# Patient Record
Sex: Female | Born: 1985 | Race: Black or African American | Hispanic: No | Marital: Single | Smoking: Never smoker
Health system: Southern US, Community
[De-identification: ages and names within clinical notes are randomized; demographics above are authoritative.]

## PROBLEM LIST (undated history)

## (undated) ENCOUNTER — Inpatient Hospital Stay (HOSPITAL_COMMUNITY): Payer: Self-pay

## (undated) DIAGNOSIS — A749 Chlamydial infection, unspecified: Secondary | ICD-10-CM

## (undated) DIAGNOSIS — O009 Unspecified ectopic pregnancy without intrauterine pregnancy: Secondary | ICD-10-CM

## (undated) DIAGNOSIS — R011 Cardiac murmur, unspecified: Secondary | ICD-10-CM

## (undated) DIAGNOSIS — N730 Acute parametritis and pelvic cellulitis: Secondary | ICD-10-CM

## (undated) DIAGNOSIS — R87629 Unspecified abnormal cytological findings in specimens from vagina: Secondary | ICD-10-CM

## (undated) DIAGNOSIS — D649 Anemia, unspecified: Secondary | ICD-10-CM

## (undated) DIAGNOSIS — O139 Gestational [pregnancy-induced] hypertension without significant proteinuria, unspecified trimester: Secondary | ICD-10-CM

## (undated) DIAGNOSIS — R87619 Unspecified abnormal cytological findings in specimens from cervix uteri: Secondary | ICD-10-CM

## (undated) DIAGNOSIS — IMO0002 Reserved for concepts with insufficient information to code with codable children: Secondary | ICD-10-CM

## (undated) HISTORY — PX: TOOTH EXTRACTION: SUR596

## (undated) HISTORY — PX: NO PAST SURGERIES: SHX2092

---
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
## 2005-01-04 ENCOUNTER — Emergency Department: Payer: No Typology Code available for payment source | Admitting: Emergency Medicine

## 2010-04-01 ENCOUNTER — Inpatient Hospital Stay (HOSPITAL_COMMUNITY)
Admission: AD | Admit: 2010-04-01 | Discharge: 2010-04-02 | Payer: Self-pay | Source: Home / Self Care | Admitting: Obstetrics & Gynecology

## 2010-07-08 LAB — WET PREP, GENITAL

## 2010-07-08 LAB — GC/CHLAMYDIA PROBE AMP, GENITAL: Chlamydia, DNA Probe: NEGATIVE

## 2010-07-08 LAB — URINALYSIS, ROUTINE W REFLEX MICROSCOPIC
Glucose, UA: NEGATIVE mg/dL
Hgb urine dipstick: NEGATIVE
Ketones, ur: NEGATIVE mg/dL
Protein, ur: NEGATIVE mg/dL
Urobilinogen, UA: 0.2 mg/dL (ref 0.0–1.0)

## 2010-07-29 ENCOUNTER — Emergency Department (HOSPITAL_COMMUNITY)
Admission: EM | Admit: 2010-07-29 | Discharge: 2010-07-30 | Disposition: A | Discharge status: Home / Self Care | Payer: Self-pay | Attending: Emergency Medicine | Admitting: Emergency Medicine

## 2010-07-29 DIAGNOSIS — L0231 Cutaneous abscess of buttock: Secondary | ICD-10-CM | POA: Insufficient documentation

## 2011-04-06 ENCOUNTER — Inpatient Hospital Stay (HOSPITAL_COMMUNITY)
Admission: AD | Admit: 2011-04-06 | Discharge: 2011-04-06 | Disposition: A | Discharge status: Home / Self Care | Payer: Medicaid Other | Source: Ambulatory Visit | Attending: Obstetrics & Gynecology | Admitting: Obstetrics & Gynecology

## 2011-04-06 ENCOUNTER — Encounter (HOSPITAL_COMMUNITY): Payer: Self-pay | Admitting: *Deleted

## 2011-04-06 DIAGNOSIS — R05 Cough: Secondary | ICD-10-CM | POA: Insufficient documentation

## 2011-04-06 DIAGNOSIS — R509 Fever, unspecified: Secondary | ICD-10-CM | POA: Insufficient documentation

## 2011-04-06 DIAGNOSIS — J111 Influenza due to unidentified influenza virus with other respiratory manifestations: Secondary | ICD-10-CM

## 2011-04-06 DIAGNOSIS — R059 Cough, unspecified: Secondary | ICD-10-CM | POA: Insufficient documentation

## 2011-04-06 DIAGNOSIS — R229 Localized swelling, mass and lump, unspecified: Secondary | ICD-10-CM | POA: Insufficient documentation

## 2011-04-06 DIAGNOSIS — J029 Acute pharyngitis, unspecified: Secondary | ICD-10-CM | POA: Insufficient documentation

## 2011-04-06 HISTORY — DX: Reserved for concepts with insufficient information to code with codable children: IMO0002

## 2011-04-06 HISTORY — DX: Cardiac murmur, unspecified: R01.1

## 2011-04-06 HISTORY — DX: Unspecified abnormal cytological findings in specimens from cervix uteri: R87.619

## 2011-04-06 HISTORY — DX: Chlamydial infection, unspecified: A74.9

## 2011-04-06 LAB — URINALYSIS, ROUTINE W REFLEX MICROSCOPIC
Ketones, ur: NEGATIVE mg/dL
Leukocytes, UA: NEGATIVE
Nitrite: NEGATIVE
Protein, ur: NEGATIVE mg/dL
Urobilinogen, UA: 0.2 mg/dL (ref 0.0–1.0)
pH: 7 (ref 5.0–8.0)

## 2011-04-06 MED ORDER — OSELTAMIVIR PHOSPHATE 75 MG PO CAPS: 75.0000 mg | ORAL_CAPSULE | Freq: Two times a day (BID) | ORAL | Status: AC

## 2011-04-06 MED ORDER — IBUPROFEN 800 MG PO TABS
800.0000 mg | ORAL_TABLET | Freq: Once | ORAL | Status: AC
  Administered 2011-04-06: 800 mg via ORAL
  Filled 2011-04-06: qty 1

## 2011-04-06 NOTE — Progress Notes (Signed)
Patient states she has had a low back pain for about 2 weeks. Had a knot on the lower spine that she states has been there for about one week and is getting bigger and is painful. Started having headache, chills and fever 101.9, cough and sore throat started yesterday. General aching.

## 2011-04-06 NOTE — ED Provider Notes (Signed)
History     Chief Complaint  Patient presents with  . Influenza  . Back Pain   HPI 25 y.o. female with back pain x 1 week, fever, chills, cough, sore throat since last night. No nausea, vomiting, dysuria. Lump on back, low mid back, x 1-2 weeks, started small, getting bigger, painful.    Past Medical History  Diagnosis Date  . Heart murmur     childhood  . Abnormal Pap smear   . Chlamydia     Past Surgical History  Procedure Date  . Tooth extraction     Family History  Problem Relation Age of Onset  . Anesthesia problems Neg Hx     History  Substance Use Topics  . Smoking status: Never Smoker   . Smokeless tobacco: Never Used  . Alcohol Use: Yes     Special occas.    Allergies: No Known Allergies  No prescriptions prior to admission    Review of Systems  Constitutional: Positive for fever, chills and malaise/fatigue.  HENT: Positive for sore throat.   Respiratory: Positive for cough. Negative for shortness of breath.   Cardiovascular: Negative.   Gastrointestinal: Negative.   Genitourinary: Negative.   Musculoskeletal: Positive for myalgias and back pain.  Neurological: Positive for headaches.  Psychiatric/Behavioral: Negative.    Physical Exam   Blood pressure 103/58, pulse 94, temperature 100.8 F (38.2 C), temperature source Oral, resp. rate 20, height 5\' 8"  (1.727 m), weight 168 lb 6.4 oz (76.386 kg), last menstrual period 03/27/2011, SpO2 99.00%.  Physical Exam  Nursing note and vitals reviewed. Constitutional: She is oriented to person, place, and time. She appears well-developed and well-nourished. No distress.  Cardiovascular: Normal rate, regular rhythm and normal heart sounds.   Respiratory: Effort normal and breath sounds normal. No stridor. No respiratory distress. She has no wheezes. She has no rales.  GI: Soft. There is no tenderness.  Musculoskeletal: Normal range of motion.  Neurological: She is alert and oriented to person, place, and  time.  Skin: Skin is warm and dry.     Psychiatric: She has a normal mood and affect.    MAU Course  Procedures  Results for orders placed during the hospital encounter of 04/06/11 (from the past 24 hour(s))  URINALYSIS, ROUTINE W REFLEX MICROSCOPIC     Status: Normal   Collection Time   04/06/11  9:45 AM      Component Value Range   Color, Urine YELLOW  YELLOW    APPearance CLEAR  CLEAR    Specific Gravity, Urine 1.025  1.005 - 1.030    pH 7.0  5.0 - 8.0    Glucose, UA NEGATIVE  NEGATIVE (mg/dL)   Hgb urine dipstick NEGATIVE  NEGATIVE    Bilirubin Urine NEGATIVE  NEGATIVE    Ketones, ur NEGATIVE  NEGATIVE (mg/dL)   Protein, ur NEGATIVE  NEGATIVE (mg/dL)   Urobilinogen, UA 0.2  0.0 - 1.0 (mg/dL)   Nitrite NEGATIVE  NEGATIVE    Leukocytes, UA NEGATIVE  NEGATIVE    Flu test pending  Assessment and Plan  25 y.o. female with flu like symptoms - treat presumptively with Tamiflu, comfort measures rev'd, f/u at [facility] if symptoms worsen Mass in lumbar area - f/u at urgent care for eval   Barron Vanloan 04/06/2011, 10:14 AM

## 2011-06-19 ENCOUNTER — Encounter (HOSPITAL_COMMUNITY): Payer: Self-pay | Admitting: *Deleted

## 2011-06-19 ENCOUNTER — Emergency Department (HOSPITAL_COMMUNITY): Payer: Medicaid Other

## 2011-06-19 ENCOUNTER — Emergency Department (HOSPITAL_COMMUNITY)
Admission: EM | Admit: 2011-06-19 | Discharge: 2011-06-20 | Disposition: A | Discharge status: Home / Self Care | Payer: Medicaid Other | Attending: Emergency Medicine | Admitting: Emergency Medicine

## 2011-06-19 DIAGNOSIS — R509 Fever, unspecified: Secondary | ICD-10-CM | POA: Insufficient documentation

## 2011-06-19 DIAGNOSIS — N898 Other specified noninflammatory disorders of vagina: Secondary | ICD-10-CM | POA: Insufficient documentation

## 2011-06-19 DIAGNOSIS — N949 Unspecified condition associated with female genital organs and menstrual cycle: Secondary | ICD-10-CM | POA: Insufficient documentation

## 2011-06-19 DIAGNOSIS — B349 Viral infection, unspecified: Secondary | ICD-10-CM

## 2011-06-19 DIAGNOSIS — R062 Wheezing: Secondary | ICD-10-CM | POA: Insufficient documentation

## 2011-06-19 DIAGNOSIS — B9789 Other viral agents as the cause of diseases classified elsewhere: Secondary | ICD-10-CM | POA: Insufficient documentation

## 2011-06-19 DIAGNOSIS — R197 Diarrhea, unspecified: Secondary | ICD-10-CM | POA: Insufficient documentation

## 2011-06-19 DIAGNOSIS — R05 Cough: Secondary | ICD-10-CM | POA: Insufficient documentation

## 2011-06-19 DIAGNOSIS — R112 Nausea with vomiting, unspecified: Secondary | ICD-10-CM | POA: Insufficient documentation

## 2011-06-19 DIAGNOSIS — R059 Cough, unspecified: Secondary | ICD-10-CM | POA: Insufficient documentation

## 2011-06-19 LAB — WET PREP, GENITAL

## 2011-06-19 LAB — DIFFERENTIAL
Basophils Absolute: 0 10*3/uL (ref 0.0–0.1)
Basophils Relative: 0 % (ref 0–1)
Monocytes Absolute: 0.3 10*3/uL (ref 0.1–1.0)
Neutro Abs: 1.5 10*3/uL — ABNORMAL LOW (ref 1.7–7.7)
Neutrophils Relative %: 66 % (ref 43–77)

## 2011-06-19 LAB — POCT PREGNANCY, URINE: Preg Test, Ur: NEGATIVE

## 2011-06-19 LAB — CBC
MCHC: 33.5 g/dL (ref 30.0–36.0)
Platelets: 136 10*3/uL — ABNORMAL LOW (ref 150–400)
RDW: 13.9 % (ref 11.5–15.5)

## 2011-06-19 LAB — BASIC METABOLIC PANEL
Chloride: 97 mEq/L (ref 96–112)
Creatinine, Ser: 0.79 mg/dL (ref 0.50–1.10)
GFR calc Af Amer: >90 mL/min (ref >90)
Potassium: 3.6 mEq/L (ref 3.5–5.1)

## 2011-06-19 LAB — URINALYSIS, ROUTINE W REFLEX MICROSCOPIC
Ketones, ur: NEGATIVE mg/dL
Leukocytes, UA: NEGATIVE
Nitrite: NEGATIVE
pH: 7.5 (ref 5.0–8.0)

## 2011-06-19 MED ORDER — ONDANSETRON HCL 4 MG/2ML IJ SOLN
4.0000 mg | Freq: Once | INTRAMUSCULAR | Status: AC
  Administered 2011-06-19: 4 mg via INTRAVENOUS
  Filled 2011-06-19: qty 2

## 2011-06-19 MED ORDER — SODIUM CHLORIDE 0.9 % IV BOLUS (SEPSIS)
1000.0000 mL | Freq: Once | INTRAVENOUS | Status: AC
  Administered 2011-06-19: 1000 mL via INTRAVENOUS

## 2011-06-19 NOTE — ED Notes (Signed)
MD at bedside. Dammen, PA in for pelvic exam

## 2011-06-19 NOTE — ED Notes (Signed)
Pt states that she has chills, N/V, and a headache x 3 days.  Pt states that "she cant feel normal" unless she takes motrin.  Pt states that she is vomiting since yesterday every time she eats.  Pt states that she is having "a lot" of vaginal discharge that is described as clear, not malodorous, but profuse.  Pt states that she feels as if her vagina is "swollen but I don't know".

## 2011-06-19 NOTE — ED Notes (Signed)
Patient transported to Ultrasound 

## 2011-06-19 NOTE — ED Provider Notes (Signed)
History     CSN: [id number]  Arrival date & time 06/19/11  1941   First MD Initiated Contact with Patient 06/19/11 2135      Chief Complaint  Patient presents with  . URI  . Vaginal Discharge     HPI  History provided by the patient. Patient is a 26 year old G4P3 female with past history of Chlamydia infection who presents with complaints of URI symptoms as well as complaints of clear vaginal discharge for the past 4 days. URI symptoms are described as nasal congestion, rhinorrhea, productive cough, generalized fatigue and myalgias. Symptoms are improved with ibuprofen. She has been taking this every 4-6 hours regularly. Vaginal discharge described as copious amounts of clear odorless fluid. She denies any vaginal bleeding, rash or pain. She denies any dysuria, hematuria, urinary frequency. Patient also reports associated subjective fever, chills, onset of nausea vomiting diarrhea for the past 2 days. She denies any other aggravating or alleviating factors. She denies any recent travel or known sick contacts.   Past Medical History  Diagnosis Date  . Heart murmur     childhood  . Abnormal Pap smear   . Chlamydia     Past Surgical History  Procedure Date  . Tooth extraction     Family History  Problem Relation Age of Onset  . Anesthesia problems Neg Hx     History  Substance Use Topics  . Smoking status: Never Smoker   . Smokeless tobacco: Never Used  . Alcohol Use: Yes     Special occas.    OB History    Grav Para Term Preterm Abortions TAB SAB Ect Mult Living   5 4 1 3 1  0 1 0 0 3      Review of Systems  Constitutional: Positive for fever, chills and appetite change.  HENT: Positive for congestion and rhinorrhea.   Respiratory: Positive for cough.   Gastrointestinal: Positive for nausea, vomiting and diarrhea.  Genitourinary: Positive for vaginal discharge. Negative for dysuria, frequency, hematuria, flank pain, vaginal bleeding, vaginal pain and pelvic pain.   All other systems reviewed and are negative.    Allergies  Review of patient's allergies indicates no known allergies.  Home Medications   Current Outpatient Rx  Name Route Sig Dispense Refill  . IBUPROFEN 200 MG PO TABS Oral Take 400-600 mg by mouth every 6 (six) hours as needed. Pain      BP 112/76  Pulse 68  Temp(Src) 99.3 F (37.4 C) (Oral)  SpO2 100%  LMP 05/29/2011  Physical Exam  Nursing note and vitals reviewed. Constitutional: She is oriented to person, place, and time. She appears well-developed and well-nourished. No distress.  HENT:  Head: Normocephalic and atraumatic.  Neck: Normal range of motion. Neck supple.       No meningeal signs  Cardiovascular: Normal rate and regular rhythm.   No murmur heard. Pulmonary/Chest: Effort normal. No respiratory distress. She has wheezes. She has rales.  Abdominal: Soft. She exhibits no distension. There is no tenderness. There is no rebound and no guarding.  Genitourinary: Cervix exhibits discharge and friability. Right adnexum displays tenderness. Right adnexum displays no mass. Left adnexum displays tenderness. Left adnexum displays no mass.       Chaperone was present  Neurological: She is alert and oriented to person, place, and time.  Skin: Skin is warm and dry. No rash noted.  Psychiatric: She has a normal mood and affect. Her behavior is normal.    ED Course  Procedures  Results for orders placed during the hospital encounter of 06/19/11  CBC      Component Value Range   WBC 2.3 (*) 4.0 - 10.5 (K/uL)   RBC 3.99  3.87 - 5.11 (MIL/uL)   Hemoglobin 11.2 (*) 12.0 - 15.0 (g/dL)   HCT 84.6 (*) 96.2 - 46.0 (%)   MCV 83.7  78.0 - 100.0 (fL)   MCH 28.1  26.0 - 34.0 (pg)   MCHC 33.5  30.0 - 36.0 (g/dL)   RDW 95.2  84.1 - 32.4 (%)   Platelets 136 (*) 150 - 400 (K/uL)  DIFFERENTIAL      Component Value Range   Neutrophils Relative 66  43 - 77 (%)   Neutro Abs 1.5 (*) 1.7 - 7.7 (K/uL)   Lymphocytes Relative 22   12 - 46 (%)   Lymphs Abs 0.5 (*) 0.7 - 4.0 (K/uL)   Monocytes Relative 11  3 - 12 (%)   Monocytes Absolute 0.3  0.1 - 1.0 (K/uL)   Eosinophils Relative 0  0 - 5 (%)   Eosinophils Absolute 0.0  0.0 - 0.7 (K/uL)   Basophils Relative 0  0 - 1 (%)   Basophils Absolute 0.0  0.0 - 0.1 (K/uL)  BASIC METABOLIC PANEL      Component Value Range   Sodium 132 (*) 135 - 145 (mEq/L)   Potassium 3.6  3.5 - 5.1 (mEq/L)   Chloride 97  96 - 112 (mEq/L)   CO2 27  19 - 32 (mEq/L)   Glucose, Bld 86  70 - 99 (mg/dL)   BUN 7  6 - 23 (mg/dL)   Creatinine, Ser 4.01  0.50 - 1.10 (mg/dL)   Calcium 8.5  8.4 - 02.7 (mg/dL)   GFR calc non Af Amer >90  >90 (mL/min)   GFR calc Af Amer >90  >90 (mL/min)  URINALYSIS, ROUTINE W REFLEX MICROSCOPIC      Component Value Range   Color, Urine YELLOW  YELLOW    APPearance CLEAR  CLEAR    Specific Gravity, Urine 1.019  1.005 - 1.030    pH 7.5  5.0 - 8.0    Glucose, UA NEGATIVE  NEGATIVE (mg/dL)   Hgb urine dipstick NEGATIVE  NEGATIVE    Bilirubin Urine NEGATIVE  NEGATIVE    Ketones, ur NEGATIVE  NEGATIVE (mg/dL)   Protein, ur NEGATIVE  NEGATIVE (mg/dL)   Urobilinogen, UA 1.0  0.0 - 1.0 (mg/dL)   Nitrite NEGATIVE  NEGATIVE    Leukocytes, UA NEGATIVE  NEGATIVE   WET PREP, GENITAL      Component Value Range   Yeast Wet Prep HPF POC NONE SEEN  NONE SEEN    Trich, Wet Prep NONE SEEN  NONE SEEN    Clue Cells Wet Prep HPF POC FEW (*) NONE SEEN    WBC, Wet Prep HPF POC FEW (*) NONE SEEN   POCT PREGNANCY, URINE      Component Value Range   Preg Test, Ur NEGATIVE  NEGATIVE      Dg Chest 2 View  06/19/2011  *RADIOLOGY REPORT*  Clinical Data: Cough, fever.  CHEST - 2 VIEW  Comparison: None  Findings: Heart and mediastinal contours are within normal limits. No focal opacities or effusions.  No acute bony abnormality.  IMPRESSION: No active cardiopulmonary disease.  Original Report Authenticated By: Cyndie Chime, M.D.   US Transvaginal Non-ob  06/20/2011  *RADIOLOGY  REPORT*  Clinical Data: Vaginal discharge, pelvic pain.  TRANSABDOMINAL AND TRANSVAGINAL ULTRASOUND  OF PELVIS  Technique:  Both transabdominal and transvaginal ultrasound examinations of the pelvis were performed including evaluation of the uterus, ovaries, adnexal regions, and pelvic cul-de-sac.  Comparison: None.  Findings:  Uterus: 9.3 x 4.1 x 5.2 cm.  Normal echotexture.  No focal abnormality.  Endometrium: Normal appearance and thickness, 12 mm.  IUD is in place within the lower uterine segment.  Right Ovary: 3.9 x 1.8 x 1.8 cm. Normal size and echotexture.  No adnexal masses.  Left Ovary: 2.5 x 1.7 x 8-0.3 cm. Normal size and echotexture.  No adnexal masses.  Other Findings:  Small amount of free fluid.  IMPRESSION: IUD within the lower uterine segment.  No acute findings.  Original Report Authenticated By: Cyndie Chime, M.D.   US Pelvis Complete  06/20/2011  *RADIOLOGY REPORT*  Clinical Data: Vaginal discharge, pelvic pain.  TRANSABDOMINAL AND TRANSVAGINAL ULTRASOUND OF PELVIS  Technique:  Both transabdominal and transvaginal ultrasound examinations of the pelvis were performed including evaluation of the uterus, ovaries, adnexal regions, and pelvic cul-de-sac.  Comparison: None.  Findings:  Uterus: 9.3 x 4.1 x 5.2 cm.  Normal echotexture.  No focal abnormality.  Endometrium: Normal appearance and thickness, 12 mm.  IUD is in place within the lower uterine segment.  Right Ovary: 3.9 x 1.8 x 1.8 cm. Normal size and echotexture.  No adnexal masses.  Left Ovary: 2.5 x 1.7 x 8-0.3 cm. Normal size and echotexture.  No adnexal masses.  Other Findings:  Small amount of free fluid.  IMPRESSION: IUD within the lower uterine segment.  No acute findings.  Original Report Authenticated By: Cyndie Chime, M.D.     1. Viral syndrome       MDM  9:45PM patient seen and evaluated. Patient no acute distress.        Angus Seller, [place] 06/20/11 [phone]

## 2011-06-19 NOTE — ED Notes (Signed)
Pt to bathroom to obtain U/A

## 2011-06-20 ENCOUNTER — Emergency Department (HOSPITAL_COMMUNITY): Payer: Medicaid Other

## 2011-06-20 NOTE — ED Notes (Signed)
Patient transported to Ultrasound 

## 2011-06-20 NOTE — Discharge Instructions (Signed)
Your lab tests and ultrasound studies today have not shown any signs for concerning or emergent cause of your symptoms. Your chest x-ray today also did not show any signs for pneumonia infection causing your symptoms. At this time your providers feel that you have a virus causing your symptoms. It is important to continue to drink plenty of fluids to stay hydrated. Continue to use Tylenol and ibuprofen for body aches and fever. Please followup with a primary care provider next week to be sure your symptoms are improving.  Viral Infections A viral infection can be caused by different types of viruses.Most viral infections are not serious and resolve on their own. However, some infections may cause severe symptoms and may lead to further complications. SYMPTOMS Viruses can frequently cause:  Minor sore throat.   Aches and pains.   Headaches.   Runny nose.   Different types of rashes.   Watery eyes.   Tiredness.   Cough.   Loss of appetite.   Gastrointestinal infections, resulting in nausea, vomiting, and diarrhea.  These symptoms do not respond to antibiotics because the infection is not caused by bacteria. However, you might catch a bacterial infection following the viral infection. This is sometimes called a "superinfection." Symptoms of such a bacterial infection may include:  Worsening sore throat with pus and difficulty swallowing.   Swollen neck glands.   Chills and a high or persistent fever.   Severe headache.   Tenderness over the sinuses.   Persistent overall ill feeling (malaise), muscle aches, and tiredness (fatigue).   Persistent cough.   Yellow, green, or brown mucus production with coughing.  HOME CARE INSTRUCTIONS   Only take over-the-counter or prescription medicines for pain, discomfort, diarrhea, or fever as directed by your caregiver.   Drink enough water and fluids to keep your urine clear or pale yellow. Sports drinks can provide valuable  electrolytes, sugars, and hydration.   Get plenty of rest and maintain proper nutrition. Soups and broths with crackers or rice are fine.  SEEK IMMEDIATE MEDICAL CARE IF:   You have severe headaches, shortness of breath, chest pain, neck pain, or an unusual rash.   You have uncontrolled vomiting, diarrhea, or you are unable to keep down fluids.   You or your child has an oral temperature above 102 F (38.9 C), not controlled by medicine.   Your baby is older than 3 months with a rectal temperature of 102 F (38.9 C) or higher.   Your baby is 34 months old or younger with a rectal temperature of 100.4 F (38 C) or higher.  MAKE SURE YOU:   Understand these instructions.   Will watch your condition.   Will get help right away if you are not doing well or get worse.  Document Released: 01/21/2005 Document Revised: 12/24/2010 Document Reviewed: 08/18/2010 [facility] Patient Information 2012 [place], [place].    Viral Syndrome You or your child has Viral Syndrome. It is the most common infection causing "colds" and infections in the nose, throat, sinuses, and breathing tubes. Sometimes the infection causes nausea, vomiting, or diarrhea. The germ that causes the infection is a virus. No antibiotic or other medicine will kill it. There are medicines that you can take to make you or your child more comfortable.  HOME CARE INSTRUCTIONS   Rest in bed until you start to feel better.   If you have diarrhea or vomiting, eat small amounts of crackers and toast. Soup is helpful.   Do not give aspirin  or medicine that contains aspirin to children.   Only take over-the-counter or prescription medicines for pain, discomfort, or fever as directed by your caregiver.  SEEK IMMEDIATE MEDICAL CARE IF:   You or your child has not improved within one week.   You or your child has pain that is not at least partially relieved by over-the-counter medicine.   Thick, colored mucus or blood is coughed up.    Discharge from the nose becomes thick yellow or green.   Diarrhea or vomiting gets worse.   There is any major change in your or your child's condition.   You or your child develops a skin rash, stiff neck, severe headache, or are unable to hold down food or fluid.   You or your child has an oral temperature above 102 F (38.9 C), not controlled by medicine.   Your baby is older than 3 months with a rectal temperature of 102 F (38.9 C) or higher.   Your baby is 58 months old or younger with a rectal temperature of 100.4 F (38 C) or higher.  Document Released: 03/29/2006 Document Revised: 12/24/2010 Document Reviewed: 03/30/2007 [facility] Patient Information 2012 [place], [place].

## 2011-06-20 NOTE — ED Provider Notes (Signed)
Medical screening examination/treatment/procedure(s) were performed by non-physician practitioner and as supervising physician I was immediately available for consultation/collaboration.  Hriday Stai L Shahd Occhipinti, MD 06/20/11 1204 

## 2012-01-30 ENCOUNTER — Encounter (HOSPITAL_COMMUNITY): Payer: Self-pay | Admitting: Emergency Medicine

## 2012-01-30 ENCOUNTER — Emergency Department (HOSPITAL_COMMUNITY): Payer: No Typology Code available for payment source

## 2012-01-30 ENCOUNTER — Emergency Department (HOSPITAL_COMMUNITY)
Admission: EM | Admit: 2012-01-30 | Discharge: 2012-01-31 | Disposition: A | Discharge status: Home / Self Care | Payer: No Typology Code available for payment source | Attending: Emergency Medicine | Admitting: Emergency Medicine

## 2012-01-30 DIAGNOSIS — R102 Pelvic and perineal pain: Secondary | ICD-10-CM

## 2012-01-30 DIAGNOSIS — N72 Inflammatory disease of cervix uteri: Secondary | ICD-10-CM | POA: Insufficient documentation

## 2012-01-30 DIAGNOSIS — N949 Unspecified condition associated with female genital organs and menstrual cycle: Secondary | ICD-10-CM | POA: Insufficient documentation

## 2012-01-30 LAB — CBC WITH DIFFERENTIAL/PLATELET
Basophils Absolute: 0 10*3/uL (ref 0.0–0.1)
Basophils Relative: 0 % (ref 0–1)
Eosinophils Absolute: 0 10*3/uL (ref 0.0–0.7)
Eosinophils Relative: 0 % (ref 0–5)
Lymphocytes Relative: 16 % (ref 12–46)
MCH: 28.5 pg (ref 26.0–34.0)
MCV: 81.7 fL (ref 78.0–100.0)
Platelets: 162 10*3/uL (ref 150–400)
RDW: 13.1 % (ref 11.5–15.5)
WBC: 7.9 10*3/uL (ref 4.0–10.5)

## 2012-01-30 LAB — URINALYSIS, ROUTINE W REFLEX MICROSCOPIC
Bilirubin Urine: NEGATIVE
Nitrite: NEGATIVE
Specific Gravity, Urine: 1.016 (ref 1.005–1.030)
pH: 6 (ref 5.0–8.0)

## 2012-01-30 LAB — WET PREP, GENITAL: Yeast Wet Prep HPF POC: NONE SEEN

## 2012-01-30 LAB — POCT PREGNANCY, URINE: Preg Test, Ur: NEGATIVE

## 2012-01-30 LAB — URINE MICROSCOPIC-ADD ON

## 2012-01-30 MED ORDER — SODIUM CHLORIDE 0.9 % IV BOLUS (SEPSIS)
1000.0000 mL | Freq: Once | INTRAVENOUS | Status: AC
  Administered 2012-01-30: 1000 mL via INTRAVENOUS

## 2012-01-30 MED ORDER — HYDROMORPHONE HCL PF 1 MG/ML IJ SOLN
1.0000 mg | Freq: Once | INTRAMUSCULAR | Status: AC
  Administered 2012-01-30: 1 mg via INTRAVENOUS
  Filled 2012-01-30: qty 1

## 2012-01-30 MED ORDER — OXYCODONE-ACETAMINOPHEN 5-325 MG PO TABS
2.0000 | ORAL_TABLET | Freq: Once | ORAL | Status: AC
  Administered 2012-01-31: 2 via ORAL
  Filled 2012-01-30: qty 2

## 2012-01-30 MED ORDER — ONDANSETRON HCL 4 MG/2ML IJ SOLN
4.0000 mg | Freq: Once | INTRAMUSCULAR | Status: AC
  Administered 2012-01-30: 4 mg via INTRAVENOUS
  Filled 2012-01-30: qty 2

## 2012-01-30 MED ORDER — OXYCODONE-ACETAMINOPHEN 5-325 MG PO TABS: 1.0000 | ORAL_TABLET | ORAL | Status: DC | PRN

## 2012-01-30 MED ORDER — DEXTROSE 5 % IV SOLN
1.0000 g | Freq: Once | INTRAVENOUS | Status: AC
  Administered 2012-01-30: 1 g via INTRAVENOUS
  Filled 2012-01-30: qty 10

## 2012-01-30 MED ORDER — AZITHROMYCIN 250 MG PO TABS
1000.0000 mg | ORAL_TABLET | Freq: Once | ORAL | Status: AC
  Administered 2012-01-30: 1000 mg via ORAL
  Filled 2012-01-30: qty 4

## 2012-01-30 MED ORDER — METRONIDAZOLE 500 MG PO TABS: 500.0000 mg | ORAL_TABLET | Freq: Two times a day (BID) | ORAL | Status: DC

## 2012-01-30 NOTE — ED Notes (Signed)
Pt with lower back pain, chills, headache, and abdominal pain.  Headache started three days ago.  Pt has had nausea but no vomiting.  Denies diarrhea.  Pt took Azo thinking it was a urinary tract infection, because she has had them in the past but this time no improvement.

## 2012-01-30 NOTE — ED Notes (Signed)
3 day history of low back pain, abd pain, and severe headache. Denies dysuria, frequency, urgency. Denies vaginal discharge or odor, endorses IUD x 4 years.

## 2012-01-30 NOTE — ED Provider Notes (Signed)
History     CSN: [id number]  Arrival date & time 01/30/12  1740   First MD Initiated Contact with Patient 01/30/12 1937      Chief Complaint  Patient presents with  . Back Pain    (Consider location/radiation/quality/duration/timing/severity/associated sxs/prior treatment) HPI Patient complaining of low abdominal pain and low back pain present for three days with worsening. Pain is sharp and radiates to bilateral flanks.  Patient takes ibuprofen with some decrease in pain.  Pain is currently 10/10.  Feels light headed and generally weak.   Patient with no dysuria, frequency, fever, vomiting, vaginal discharge.  Endorses nausea, anorexia, and chills.  Past Medical History  Diagnosis Date  . Heart murmur     childhood  . Abnormal Pap smear   . Chlamydia     Past Surgical History  Procedure Date  . Tooth extraction     Family History  Problem Relation Age of Onset  . Anesthesia problems Neg Hx     History  Substance Use Topics  . Smoking status: Never Smoker   . Smokeless tobacco: Never Used  . Alcohol Use: Yes     Special occas.    OB History    Grav Para Term Preterm Abortions TAB SAB Ect Mult Living   5 4 1 3 1  0 1 0 0 3      Review of Systems  Constitutional: Negative for fever, chills, activity change, appetite change and unexpected weight change.  HENT: Negative for sore throat, rhinorrhea, neck pain, neck stiffness and sinus pressure.   Eyes: Negative for visual disturbance.  Respiratory: Negative for cough and shortness of breath.   Cardiovascular: Negative for chest pain and leg swelling.  Gastrointestinal: Negative for vomiting, abdominal pain, diarrhea and blood in stool.  Genitourinary: Negative for dysuria, urgency, frequency, vaginal discharge and difficulty urinating.  Musculoskeletal: Negative for myalgias, arthralgias and gait problem.  Skin: Negative for color change and rash.  Neurological: Negative for weakness, light-headedness and  headaches.  Hematological: Does not bruise/bleed easily.  Psychiatric/Behavioral: Negative for dysphoric mood.    Allergies  Review of patient's allergies indicates no known allergies.  Home Medications   Current Outpatient Rx  Name Route Sig Dispense Refill  . IBUPROFEN 200 MG PO TABS Oral Take 400-600 mg by mouth every 6 (six) hours as needed. Pain      BP 115/59  Pulse 71  Temp 98.8 F (37.1 C) (Oral)  Resp 18  Ht 5\' 7"  (1.702 m)  SpO2 98%  LMP 01/04/2012  Physical Exam  Nursing note and vitals reviewed. Constitutional: She is oriented to person, place, and time. She appears well-developed and well-nourished.  HENT:  Head: Normocephalic and atraumatic.  Eyes: Conjunctivae normal and EOM are normal. Pupils are equal, round, and reactive to light.  Neck: Normal range of motion. Neck supple.  Cardiovascular: Normal rate and regular rhythm.   Pulmonary/Chest: Effort normal and breath sounds normal.  Abdominal: Soft.  Genitourinary: Uterus normal. Pelvic exam was performed with patient supine. There is no rash, tenderness, lesion or injury on the right labia. There is no rash, tenderness, lesion or injury on the left labia. No tenderness around the vagina. Vaginal discharge found.  Musculoskeletal: Normal range of motion.  Neurological: She is alert and oriented to person, place, and time.  Skin: Skin is warm and dry.  Psychiatric: She has a normal mood and affect.    ED Course  Procedures (including critical care time)  Labs Reviewed  URINALYSIS, ROUTINE  W REFLEX MICROSCOPIC - Abnormal; Notable for the following:    APPearance CLOUDY (*)     Ketones, ur 15 (*)     Leukocytes, UA SMALL (*)     All other components within normal limits  POCT PREGNANCY, URINE  URINE MICROSCOPIC-ADD ON   No results found.   No diagnosis found.  US Transvaginal Non-ob  01/30/2012  *RADIOLOGY REPORT*  Clinical Data: Low back and pelvic pain.  IUD.  TRANSABDOMINAL AND TRANSVAGINAL  ULTRASOUND OF PELVIS Technique:  Both transabdominal and transvaginal ultrasound examinations of the pelvis were performed. Transabdominal technique was performed for global imaging of the pelvis including uterus, ovaries, adnexal regions, and pelvic cul-de-sac.  It was necessary to proceed with endovaginal exam following the transabdominal exam to visualize the endometrium.  Comparison:  06/20/2011  Findings:  Uterus: 4.5 x 7 x 10.8 cm.  No focal lesion.  IUD in the lower uterine segment.  Endometrium: 8.8 mm in thickness, unremarkable  Right ovary:  24 x 35 x 35 mm containing normal-appearing follicles.  Left ovary: 27 x 30 x 30 mm, unremarkable.  Other findings: No free fluid  IMPRESSION:  1.  IUD projects in the lower uterine segment. 2.  No adnexal mass, free fluid , or acute abnormality.   Original Report Authenticated By: Osa Craver, M.D.    US Pelvis Complete  01/30/2012  *RADIOLOGY REPORT*  Clinical Data: Low back and pelvic pain.  IUD.  TRANSABDOMINAL AND TRANSVAGINAL ULTRASOUND OF PELVIS Technique:  Both transabdominal and transvaginal ultrasound examinations of the pelvis were performed. Transabdominal technique was performed for global imaging of the pelvis including uterus, ovaries, adnexal regions, and pelvic cul-de-sac.  It was necessary to proceed with endovaginal exam following the transabdominal exam to visualize the endometrium.  Comparison:  06/20/2011  Findings:  Uterus: 4.5 x 7 x 10.8 cm.  No focal lesion.  IUD in the lower uterine segment.  Endometrium: 8.8 mm in thickness, unremarkable  Right ovary:  24 x 35 x 35 mm containing normal-appearing follicles.  Left ovary: 27 x 30 x 30 mm, unremarkable.  Other findings: No free fluid  IMPRESSION:  1.  IUD projects in the lower uterine segment. 2.  No adnexal mass, free fluid , or acute abnormality.   Original Report Authenticated By: Osa Craver, M.D.     MDM  Patient treated with rocephin and zithromax  Empirically  covering usual pelvic pathogens.  She is advised recheck with gyn Monday for iud replacement.          Hilario Quarry, MD 02/02/12 [phone]

## 2012-01-31 ENCOUNTER — Encounter (HOSPITAL_COMMUNITY): Payer: Self-pay | Admitting: *Deleted

## 2012-01-31 ENCOUNTER — Inpatient Hospital Stay (HOSPITAL_COMMUNITY)
Admission: AD | Admit: 2012-01-31 | Discharge: 2012-01-31 | Disposition: A | Discharge status: Home / Self Care | Payer: No Typology Code available for payment source | Source: Ambulatory Visit | Attending: Obstetrics & Gynecology | Admitting: Obstetrics & Gynecology

## 2012-01-31 DIAGNOSIS — A499 Bacterial infection, unspecified: Secondary | ICD-10-CM | POA: Insufficient documentation

## 2012-01-31 DIAGNOSIS — R109 Unspecified abdominal pain: Secondary | ICD-10-CM | POA: Insufficient documentation

## 2012-01-31 DIAGNOSIS — Z975 Presence of (intrauterine) contraceptive device: Secondary | ICD-10-CM

## 2012-01-31 DIAGNOSIS — N760 Acute vaginitis: Secondary | ICD-10-CM | POA: Insufficient documentation

## 2012-01-31 DIAGNOSIS — B9689 Other specified bacterial agents as the cause of diseases classified elsewhere: Secondary | ICD-10-CM | POA: Insufficient documentation

## 2012-01-31 DIAGNOSIS — N72 Inflammatory disease of cervix uteri: Secondary | ICD-10-CM | POA: Insufficient documentation

## 2012-01-31 MED ORDER — ONDANSETRON 8 MG PO TBDP: 8.0000 mg | ORAL_TABLET | Freq: Once | ORAL | Status: DC

## 2012-01-31 MED ORDER — OXYCODONE-ACETAMINOPHEN 5-325 MG PO TABS: 1.0000 | ORAL_TABLET | ORAL | Status: DC | PRN

## 2012-01-31 MED ORDER — ONDANSETRON 8 MG PO TBDP
8.0000 mg | ORAL_TABLET | Freq: Once | ORAL | Status: AC
  Administered 2012-01-31: 8 mg via ORAL
  Filled 2012-01-31: qty 1

## 2012-01-31 MED ORDER — IBUPROFEN 800 MG PO TABS
800.0000 mg | ORAL_TABLET | Freq: Three times a day (TID) | ORAL | Status: DC | PRN
Start: 2012-01-31 — End: 2012-04-14

## 2012-01-31 NOTE — MAU Provider Note (Signed)
History     CSN: [id number]  Arrival date and time: 01/31/12 1453   First Provider Initiated Contact with Patient 01/31/12 1530      Chief Complaint  Patient presents with  . Abdominal Pain   HPI 26 y.o. [id number] here for IUD removal. States she was at [facility] yesterday, diagnosed with "cervicitis", given antibiotics and was told to follow up here today to have her IUD taken out. Pt does not necessarily want her IUD removed, has had in place for 4 years. She developed abdominal pain over the past week, severe yesterday, which was the reason for her eval at [facility]. Feeling better today, pain controlled with percocet. Still nauseous, requesting antiemetic.   Past Medical History  Diagnosis Date  . Heart murmur     childhood  . Abnormal Pap smear   . Chlamydia     Past Surgical History  Procedure Date  . Tooth extraction   . No past surgeries     Family History  Problem Relation Age of Onset  . Anesthesia problems Neg Hx   . Alcohol abuse Neg Hx   . Arthritis Neg Hx   . Asthma Neg Hx   . Birth defects Neg Hx   . Cancer Neg Hx   . COPD Neg Hx   . Depression Neg Hx   . Diabetes Neg Hx   . Drug abuse Neg Hx   . Early death Neg Hx   . Hearing loss Neg Hx   . Heart disease Neg Hx   . Hyperlipidemia Neg Hx   . Hypertension Neg Hx   . Kidney disease Neg Hx   . Learning disabilities Neg Hx   . Mental illness Neg Hx   . Mental retardation Neg Hx   . Miscarriages / Stillbirths Neg Hx   . Stroke Neg Hx   . Vision loss Neg Hx     History  Substance Use Topics  . Smoking status: Never Smoker   . Smokeless tobacco: Never Used  . Alcohol Use: Yes     Special occas.    Allergies: No Known Allergies  Prescriptions prior to admission  Medication Sig Dispense Refill  . doxycycline (VIBRAMYCIN) 100 MG capsule Take 100 mg by mouth 2 (two) times daily.      Marland Kitchen ibuprofen (ADVIL,MOTRIN) 200 MG tablet Take 400-600 mg by mouth every 6 (six) hours as needed. Pain      .  metroNIDAZOLE (FLAGYL) 500 MG tablet Take 1 tablet (500 mg total) by mouth 2 (two) times daily.  14 tablet  0  . oxyCODONE-acetaminophen (PERCOCET/ROXICET) 5-325 MG per tablet Take 1 tablet by mouth every 4 (four) hours as needed for pain.  6 tablet  0  . phenazopyridine (PYRIDIUM) 95 MG tablet Take 95 mg by mouth 3 (three) times daily as needed. Bladder irritation.        Review of Systems  Constitutional: Positive for fever and chills.  Respiratory: Negative.   Cardiovascular: Negative.   Gastrointestinal: Positive for nausea and abdominal pain. Negative for vomiting, diarrhea and constipation.  Genitourinary: Negative for dysuria, urgency, frequency, hematuria and flank pain.       Negative for vaginal bleeding, vaginal discharge, dyspareunia  Musculoskeletal: Negative.   Neurological: Negative.   Psychiatric/Behavioral: Negative.    Physical Exam   Blood pressure 125/70, pulse 69, temperature 97.7 F (36.5 C), temperature source Oral, resp. rate 18, height 5\' 7"  (1.702 m), weight 172 lb 9.6 oz (78.291 kg), last menstrual period 01/04/2012.  Physical Exam  Nursing note and vitals reviewed. Constitutional: She is oriented to person, place, and time. She appears well-developed and well-nourished. No distress.  Cardiovascular: Normal rate.   Respiratory: Effort normal.  GI: Soft. There is tenderness (suprapubic).  Musculoskeletal: Normal range of motion.  Neurological: She is alert and oriented to person, place, and time.  Skin: Skin is warm and dry.  Psychiatric: She has a normal mood and affect.    MAU Course  Procedures  Results for orders placed during the hospital encounter of 01/30/12 (from the past 48 hour(s))  URINALYSIS, ROUTINE W REFLEX MICROSCOPIC     Status: Abnormal   Collection Time   01/30/12  6:42 PM      Component Value Range Comment   Color, Urine YELLOW  YELLOW    APPearance CLOUDY (*) CLEAR    Specific Gravity, Urine 1.016  1.005 - 1.030    pH 6.0  5.0 -  8.0    Glucose, UA NEGATIVE  NEGATIVE mg/dL    Hgb urine dipstick NEGATIVE  NEGATIVE    Bilirubin Urine NEGATIVE  NEGATIVE    Ketones, ur 15 (*) NEGATIVE mg/dL    Protein, ur NEGATIVE  NEGATIVE mg/dL    Urobilinogen, UA 1.0  0.0 - 1.0 mg/dL    Nitrite NEGATIVE  NEGATIVE    Leukocytes, UA SMALL (*) NEGATIVE   POCT PREGNANCY, URINE     Status: Normal   Collection Time   01/30/12  6:42 PM      Component Value Range Comment   Preg Test, Ur NEGATIVE  NEGATIVE   URINE MICROSCOPIC-ADD ON     Status: Normal   Collection Time   01/30/12  6:42 PM      Component Value Range Comment   Squamous Epithelial / LPF RARE  RARE    WBC, UA 11-20  <3 WBC/hpf    RBC / HPF 0-2  <3 RBC/hpf    Urine-Other MUCOUS PRESENT     CBC WITH DIFFERENTIAL     Status: Abnormal   Collection Time   01/30/12  8:20 PM      Component Value Range Comment   WBC 7.9  4.0 - 10.5 K/uL    RBC 3.89  3.87 - 5.11 MIL/uL    Hemoglobin 11.1 (*) 12.0 - 15.0 g/dL    HCT 40.9 (*) 81.1 - 46.0 %    MCV 81.7  78.0 - 100.0 fL    MCH 28.5  26.0 - 34.0 pg    MCHC 34.9  30.0 - 36.0 g/dL    RDW 91.4  78.2 - 95.6 %    Platelets 162  150 - 400 K/uL    Neutrophils Relative 68  43 - 77 %    Neutro Abs 5.3  1.7 - 7.7 K/uL    Lymphocytes Relative 16  12 - 46 %    Lymphs Abs 1.3  0.7 - 4.0 K/uL    Monocytes Relative 16 (*) 3 - 12 %    Monocytes Absolute 1.3 (*) 0.1 - 1.0 K/uL    Eosinophils Relative 0  0 - 5 %    Eosinophils Absolute 0.0  0.0 - 0.7 K/uL    Basophils Relative 0  0 - 1 %    Basophils Absolute 0.0  0.0 - 0.1 K/uL   WET PREP, GENITAL     Status: Abnormal   Collection Time   01/30/12  9:19 PM      Component Value Range Comment   Yeast Wet  Prep HPF POC NONE SEEN  NONE SEEN    Trich, Wet Prep NONE SEEN  NONE SEEN    Clue Cells Wet Prep HPF POC MODERATE (*) NONE SEEN    WBC, Wet Prep HPF POC MODERATE (*) NONE SEEN      Assessment and Plan  BV/cervicitis with IUD in place  Discussed with patient that removal of IUD is not  necessarily indicated in this situation, may leave in if desired, she is ok with leaving IUD in place at this time Continue on prescribed medications GC/CT pending F/U if symptoms worsen or do not improve    Medication List     As of 01/31/2012  5:25 PM    START taking these medications         * ibuprofen 800 MG tablet   Commonly known as: ADVIL,MOTRIN   Take 1 tablet (800 mg total) by mouth every 8 (eight) hours as needed for pain.      ondansetron 8 MG disintegrating tablet   Commonly known as: ZOFRAN-ODT   Take 1 tablet (8 mg total) by mouth once.      * oxyCODONE-acetaminophen 5-325 MG per tablet   Commonly known as: PERCOCET/ROXICET   Take 1 tablet by mouth every 4 (four) hours as needed for pain.     * Notice: This list has 2 medication(s) that are the same as other medications prescribed for you. Read the directions carefully, and ask your doctor or other care provider to review them with you.    CONTINUE taking these medications         doxycycline 100 MG capsule   Commonly known as: VIBRAMYCIN      * ibuprofen 200 MG tablet   Commonly known as: ADVIL,MOTRIN      metroNIDAZOLE 500 MG tablet   Commonly known as: FLAGYL   Take 1 tablet (500 mg total) by mouth 2 (two) times daily.      * oxyCODONE-acetaminophen 5-325 MG per tablet   Commonly known as: PERCOCET/ROXICET   Take 1 tablet by mouth every 4 (four) hours as needed for pain.      phenazopyridine 95 MG tablet   Commonly known as: PYRIDIUM     * Notice: This list has 2 medication(s) that are the same as other medications prescribed for you. Read the directions carefully, and ask your doctor or other care provider to review them with you.        Where to get your medications    These are the prescriptions that you need to pick up. We sent them to a specific pharmacy, so you will need to go there to get them.   [facility] - [place],  - [address]    [address]  [place] [address]    Phone: [phone]        ibuprofen 800 MG tablet   ondansetron 8 MG disintegrating tablet         You may get these medications from any pharmacy.         oxyCODONE-acetaminophen 5-325 MG per tablet             Saleen Peden 01/31/2012, 3:49 PM

## 2012-01-31 NOTE — MAU Note (Signed)
Pt went to [facility] last night for abd pain. Told she had cervicitis and she had to come tom MAU to have IUD removed. Pt given pain medication and antibiotics.

## 2012-01-31 NOTE — Progress Notes (Signed)
Pt taking percocet for pain

## 2012-01-31 NOTE — ED Notes (Signed)
Pt verbalized understanding of returning to ob/gyn doctor this week and having IUD  Taken out and possibly replaced but to discuss with her doctor.

## 2012-01-31 NOTE — MAU Note (Signed)
Pt states she went to [facility] hosp. For evaluation of pelvic pain. Pt states she was told that she had "Cervisits" Patient states she was give prescriptions for an antibiotic and pain medication. Pt states she was told to come ER for removal of IUD.

## 2012-02-21 ENCOUNTER — Inpatient Hospital Stay (HOSPITAL_COMMUNITY)
Admission: AD | Admit: 2012-02-21 | Discharge: 2012-02-21 | Disposition: A | Discharge status: Home / Self Care | Payer: No Typology Code available for payment source | Source: Ambulatory Visit | Attending: Obstetrics & Gynecology | Admitting: Obstetrics & Gynecology

## 2012-02-21 NOTE — MAU Note (Signed)
Pt called from the lobby. Receptionist said patient left stating she cannot wait any longer.

## 2012-04-14 ENCOUNTER — Encounter (HOSPITAL_COMMUNITY): Payer: Self-pay | Admitting: *Deleted

## 2012-04-14 ENCOUNTER — Inpatient Hospital Stay (HOSPITAL_COMMUNITY)
Admission: AD | Admit: 2012-04-14 | Discharge: 2012-04-14 | Disposition: A | Discharge status: Home / Self Care | Payer: No Typology Code available for payment source | Source: Ambulatory Visit | Attending: Obstetrics & Gynecology | Admitting: Obstetrics & Gynecology

## 2012-04-14 DIAGNOSIS — B9689 Other specified bacterial agents as the cause of diseases classified elsewhere: Secondary | ICD-10-CM

## 2012-04-14 DIAGNOSIS — A499 Bacterial infection, unspecified: Secondary | ICD-10-CM | POA: Insufficient documentation

## 2012-04-14 DIAGNOSIS — N760 Acute vaginitis: Secondary | ICD-10-CM | POA: Insufficient documentation

## 2012-04-14 DIAGNOSIS — N920 Excessive and frequent menstruation with regular cycle: Secondary | ICD-10-CM

## 2012-04-14 DIAGNOSIS — Z30432 Encounter for removal of intrauterine contraceptive device: Secondary | ICD-10-CM

## 2012-04-14 DIAGNOSIS — R109 Unspecified abdominal pain: Secondary | ICD-10-CM | POA: Insufficient documentation

## 2012-04-14 DIAGNOSIS — T839XXA Unspecified complication of genitourinary prosthetic device, implant and graft, initial encounter: Secondary | ICD-10-CM

## 2012-04-14 DIAGNOSIS — R1084 Generalized abdominal pain: Secondary | ICD-10-CM

## 2012-04-14 LAB — URINALYSIS, ROUTINE W REFLEX MICROSCOPIC
Glucose, UA: NEGATIVE mg/dL
Specific Gravity, Urine: 1.02 (ref 1.005–1.030)
pH: 5 (ref 5.0–8.0)

## 2012-04-14 LAB — WET PREP, GENITAL
Trich, Wet Prep: NONE SEEN
Yeast Wet Prep HPF POC: NONE SEEN

## 2012-04-14 LAB — POCT PREGNANCY, URINE: Preg Test, Ur: NEGATIVE

## 2012-04-14 LAB — URINE MICROSCOPIC-ADD ON

## 2012-04-14 MED ORDER — NORGESTIMATE-ETH ESTRADIOL 0.25-35 MG-MCG PO TABS: 1.0000 | ORAL_TABLET | Freq: Every day | ORAL | Status: DC

## 2012-04-14 MED ORDER — METRONIDAZOLE 500 MG PO TABS: 500.0000 mg | ORAL_TABLET | Freq: Two times a day (BID) | ORAL | Status: AC

## 2012-04-14 NOTE — MAU Note (Signed)
Had a cervical infection back in October possibly related to IUD; took Flagyl for cervical infections and IUD was not removed; has had IUD for past 4 yrs;

## 2012-04-14 NOTE — MAU Provider Note (Signed)
Chief Complaint: Abdominal Pain   First Provider Initiated Contact with Patient 04/14/12 [phone]     SUBJECTIVE HPI: Tanya Bailey is a 26 y.o. [id number] who presents to maternity admissions reporting frequent bacterial infections and current symptoms, cramping, and heavy bleeding with her Paraguard IUD.  She has had the IUD for 4 years, and it was placed after her last child was born in [place].  She denies having a choice about type of IUD, and was only offered Paraguard.  She currently lives in [place], and is recently married.  She does not have a Gyn provider.  She does not desire pregnancy right now, but reports it would not be unwelcome if it happened.  She denies personal or family history of blood clots and is a nonsmoker. She denies vaginal bleeding, urinary symptoms, h/a, dizziness, n/v, or fever/chills.     Past Medical History  Diagnosis Date  . Heart murmur     childhood  . Abnormal Pap smear   . Chlamydia    Past Surgical History  Procedure Date  . Tooth extraction   . No past surgeries    History   Social History  . Marital Status: Single    Spouse Name: N/A    Number of Children: N/A  . Years of Education: N/A   Occupational History  . Not on file.   Social History Main Topics  . Smoking status: Never Smoker   . Smokeless tobacco: Never Used  . Alcohol Use: Yes     Comment: Special occas.  . Drug Use: No  . Sexually Active: Yes    Birth Control/ Protection: IUD   Other Topics Concern  . Not on file   Social History Narrative  . No narrative on file   No current facility-administered medications on file prior to encounter.   No current outpatient prescriptions on file prior to encounter.   No Known Allergies  ROS: Pertinent items in HPI  OBJECTIVE Blood pressure 112/72, pulse 67, temperature 97.3 F (36.3 C), temperature source Axillary, resp. rate 16, height 5' 7.75" (1.721 m), weight 79.379 kg (175 lb). GENERAL: Well-developed,  well-nourished female in no acute distress.  HEENT: Normocephalic HEART: normal rate RESP: normal effort ABDOMEN: Soft, non-tender EXTREMITIES: Nontender, no edema NEURO: Alert and oriented Pelvic exam: Cervix pink, visually closed, without lesion, scant clear discharge, Paraguard string visible at cervical os, vaginal walls and external genitalia normal Bimanual exam: Cervix 0/long/high, firm, anterior, neg CMT, uterus nontender, nonenlarged, adnexa without tenderness, enlargement, or mass  LAB RESULTS Results for orders placed during the hospital encounter of 04/14/12 (from the past 24 hour(s))  POCT PREGNANCY, URINE     Status: Normal   Collection Time   04/14/12  8:16 AM      Component Value Range   Preg Test, Ur NEGATIVE  NEGATIVE    ASSESSMENT 1. IUD complication   2. Encounter for IUD removal   3. Bacterial vaginosis     PLAN IUD removed during pelvic exam without difficulty, pt tolerated well Discharge home Contraceptive teaching done, pt desires short term OCPs and may want Mirena in clinic Sprintec OCP with 3 refills for now F/U in Gyn clinic--sent message from MAU Return to MAU as needed    Sharen Counter Certified Nurse-Midwife 04/14/2012  8:59 AM

## 2012-04-15 LAB — GC/CHLAMYDIA PROBE AMP: CT Probe RNA: NEGATIVE

## 2012-04-16 ENCOUNTER — Other Ambulatory Visit: Payer: Self-pay | Admitting: Advanced Practice Midwife

## 2012-04-16 LAB — URINE CULTURE

## 2012-04-16 MED ORDER — SULFAMETHOXAZOLE-TRIMETHOPRIM 800-160 MG PO TABS: 1.0000 | ORAL_TABLET | Freq: Two times a day (BID) | ORAL | Status: DC

## 2012-04-16 NOTE — Progress Notes (Signed)
Positive Urine culture for UTI, Bactrim DS BID x7 days sent to pt pharmacy.  Message left on pt voicemail.

## 2012-04-18 NOTE — Progress Notes (Signed)
Called pt and informed her of test results indicating + UTI.  She states she did not receive message from midwife on 12/21. She voiced understanding and agrees to obtain medication from pharmacy. She had no questions.

## 2012-05-03 ENCOUNTER — Telehealth (HOSPITAL_COMMUNITY): Payer: Self-pay

## 2012-05-04 ENCOUNTER — Encounter: Payer: No Typology Code available for payment source | Admitting: Obstetrics and Gynecology

## 2012-07-31 ENCOUNTER — Inpatient Hospital Stay (HOSPITAL_COMMUNITY)
Admission: AD | Admit: 2012-07-31 | Discharge: 2012-08-01 | Disposition: A | Discharge status: Hospice | Payer: Medicaid Other | Source: Ambulatory Visit | Attending: Obstetrics & Gynecology | Admitting: Obstetrics & Gynecology

## 2012-07-31 DIAGNOSIS — N912 Amenorrhea, unspecified: Secondary | ICD-10-CM | POA: Insufficient documentation

## 2012-07-31 DIAGNOSIS — R300 Dysuria: Secondary | ICD-10-CM | POA: Insufficient documentation

## 2012-07-31 DIAGNOSIS — N390 Urinary tract infection, site not specified: Secondary | ICD-10-CM | POA: Insufficient documentation

## 2012-07-31 DIAGNOSIS — R5381 Other malaise: Secondary | ICD-10-CM | POA: Insufficient documentation

## 2012-08-01 ENCOUNTER — Encounter (HOSPITAL_COMMUNITY): Payer: Self-pay | Admitting: *Deleted

## 2012-08-01 DIAGNOSIS — N390 Urinary tract infection, site not specified: Secondary | ICD-10-CM

## 2012-08-01 LAB — URINALYSIS, ROUTINE W REFLEX MICROSCOPIC
Ketones, ur: NEGATIVE mg/dL
Nitrite: POSITIVE — AB
Specific Gravity, Urine: 1.01 (ref 1.005–1.030)
pH: 7.5 (ref 5.0–8.0)

## 2012-08-01 LAB — URINE MICROSCOPIC-ADD ON

## 2012-08-01 LAB — CBC
HCT: 34.9 % — ABNORMAL LOW (ref 36.0–46.0)
Hemoglobin: 11.8 g/dL — ABNORMAL LOW (ref 12.0–15.0)
MCH: 29.6 pg (ref 26.0–34.0)
MCV: 87.5 fL (ref 78.0–100.0)
Platelets: 159 10*3/uL (ref 150–400)
RBC: 3.99 MIL/uL (ref 3.87–5.11)

## 2012-08-01 LAB — POCT PREGNANCY, URINE: Preg Test, Ur: NEGATIVE

## 2012-08-01 MED ORDER — SULFAMETHOXAZOLE-TMP DS 800-160 MG PO TABS: 1.0000 | ORAL_TABLET | Freq: Two times a day (BID) | ORAL | Status: DC

## 2012-08-01 NOTE — MAU Note (Signed)
Pt LMP 07/03/2012, having pain with urination and frequency.  Feeling tired.

## 2012-08-01 NOTE — MAU Provider Note (Signed)
Attestation of Attending Supervision of Advanced Practitioner (PA/CNM/NP): Evaluation and management procedures were performed by the Advanced Practitioner under my supervision and collaboration.  I have reviewed the Advanced Practitioner's note and chart, and I agree with the management and plan.  Thornton Dohrmann, MD, FACOG Attending Obstetrician & Gynecologist Faculty Practice, [facility]  

## 2012-08-01 NOTE — MAU Provider Note (Signed)
History     CSN: [id number]  Arrival date and time: 07/31/12 2359   None     Chief Complaint  Patient presents with  . Amenorrhea  . Dysuria   HPI This is a 27 y.o. female who presents with c/o dysuria. Denies back or flank pain. Also c/o period being 2 days late. Last month it was 3 days late. Used to have 28 day cycles,now 30. Also c/o being tired for past 2 mos, feels like sleeping all day. Gained 10 lbs while eating less.  Has Medicaid but no family doctor yet   RN Note: Pt LMP 07/03/2012, having pain with urination and frequency. Feeling tired.  OB History   Grav Para Term Preterm Abortions TAB SAB Ect Mult Living   5 4 1 3 1  0 1 0 0 3      Past Medical History  Diagnosis Date  . Heart murmur     childhood  . Abnormal Pap smear   . Chlamydia     Past Surgical History  Procedure Laterality Date  . Tooth extraction    . No past surgeries      Family History  Problem Relation Age of Onset  . Anesthesia problems Neg Hx   . Alcohol abuse Neg Hx   . Arthritis Neg Hx   . Asthma Neg Hx   . Birth defects Neg Hx   . Cancer Neg Hx   . COPD Neg Hx   . Depression Neg Hx   . Diabetes Neg Hx   . Drug abuse Neg Hx   . Early death Neg Hx   . Hearing loss Neg Hx   . Heart disease Neg Hx   . Hyperlipidemia Neg Hx   . Hypertension Neg Hx   . Kidney disease Neg Hx   . Learning disabilities Neg Hx   . Mental illness Neg Hx   . Mental retardation Neg Hx   . Miscarriages / Stillbirths Neg Hx   . Stroke Neg Hx   . Vision loss Neg Hx   . Other Neg Hx     History  Substance Use Topics  . Smoking status: Never Smoker   . Smokeless tobacco: Never Used  . Alcohol Use: Yes     Comment: Special occas.    Allergies: No Known Allergies  Prescriptions prior to admission  Medication Sig Dispense Refill  . norgestimate-ethinyl estradiol (ORTHO-CYCLEN,SPRINTEC,PREVIFEM) 0.25-35 MG-MCG tablet Take 1 tablet by mouth daily.  1 Package  2  . OVER THE COUNTER MEDICATION Take  1 capsule by mouth once. Emergen-C      . Prenatal Vit-Fe Fumarate-FA (MULTIVITAMIN-PRENATAL) 27-0.8 MG TABS Take 1 tablet by mouth daily.      Marland Kitchen sulfamethoxazole-trimethoprim (BACTRIM DS,SEPTRA DS) 800-160 MG per tablet Take 1 tablet by mouth 2 (two) times daily.  14 tablet  0    Review of Systems  Constitutional: Positive for malaise/fatigue. Negative for fever, chills and weight loss (weight gain).  HENT: Negative for neck pain.   Gastrointestinal: Positive for abdominal pain. Negative for nausea, vomiting, diarrhea and constipation.  Genitourinary: Positive for dysuria and frequency. Negative for flank pain.  Musculoskeletal: Negative for myalgias and back pain.  Neurological: Positive for weakness. Negative for sensory change, focal weakness and headaches.  Psychiatric/Behavioral: Negative for depression.   Physical Exam   Blood pressure 118/72, pulse 54, temperature 97.9 F (36.6 C), temperature source Oral, resp. rate 16, height 5\' 7"  (1.702 m), weight 175 lb 6.4 oz (79.561 kg), last  menstrual period 07/03/2012.  Physical Exam  Constitutional: She is oriented to person, place, and time. She appears well-developed and well-nourished. No distress.  HENT:  Head: Normocephalic.  Cardiovascular: Normal rate.   Respiratory: Effort normal.  GI: Soft. She exhibits no distension. There is no tenderness. There is no rebound and no guarding.  No CVA tenderness   Musculoskeletal: Normal range of motion.  Neurological: She is alert and oriented to person, place, and time.  Skin: Skin is warm and dry.  Psychiatric: She has a normal mood and affect.    MAU Course  Procedures  MDM Results for orders placed during the hospital encounter of 07/31/12 (from the past 72 hour(s))  URINALYSIS, ROUTINE W REFLEX MICROSCOPIC     Status: Abnormal   Collection Time    08/01/12 12:16 AM      Result Value Range   Color, Urine YELLOW  YELLOW   APPearance CLEAR  CLEAR   Specific Gravity, Urine  1.010  1.005 - 1.030   pH 7.5  5.0 - 8.0   Glucose, UA NEGATIVE  NEGATIVE mg/dL   Hgb urine dipstick TRACE (*) NEGATIVE   Bilirubin Urine NEGATIVE  NEGATIVE   Ketones, ur NEGATIVE  NEGATIVE mg/dL   Protein, ur 161 (*) NEGATIVE mg/dL   Urobilinogen, UA 0.2  0.0 - 1.0 mg/dL   Nitrite POSITIVE (*) NEGATIVE   Leukocytes, UA SMALL (*) NEGATIVE  URINE MICROSCOPIC-ADD ON     Status: Abnormal   Collection Time    08/01/12 12:16 AM      Result Value Range   Squamous Epithelial / LPF FEW (*) RARE   WBC, UA 11-20  <3 WBC/hpf   RBC / HPF 3-6  <3 RBC/hpf   Bacteria, UA MANY (*) RARE  POCT PREGNANCY, URINE     Status: None   Collection Time    08/01/12 12:33 AM      Result Value Range   Preg Test, Ur NEGATIVE  NEGATIVE   Comment:            THE SENSITIVITY OF THIS     METHODOLOGY IS >24 mIU/mL   Will check a CBC and TSH.  [facility] will have those results to review.  Will treat UTI with SeptraDS  Assessment and Plan  A:  UTI       Fatigue, unknown origin  P:  Rx Septra DS       Lab will culture urine       Referred to [facility] for care  After visit:   TSH noted to be normal                    Hemoglobin just under 12  [facility] 08/01/2012, 1:29 AM

## 2012-08-02 LAB — URINE CULTURE

## 2012-08-03 ENCOUNTER — Other Ambulatory Visit: Payer: Self-pay | Admitting: Family

## 2012-08-03 MED ORDER — CEPHALEXIN 500 MG PO CAPS: 500.0000 mg | ORAL_CAPSULE | Freq: Three times a day (TID) | ORAL | Status: DC

## 2012-08-03 NOTE — Progress Notes (Signed)
Pt with positive pregnancy test at home; DC Bactrim and begin Keflex 500mg  TID x 7 days.

## 2012-08-09 ENCOUNTER — Inpatient Hospital Stay (HOSPITAL_COMMUNITY)
Admission: AD | Admit: 2012-08-09 | Discharge: 2012-08-09 | Disposition: A | Discharge status: Home / Self Care | Payer: Medicaid Other | Source: Ambulatory Visit | Attending: Obstetrics and Gynecology | Admitting: Obstetrics and Gynecology

## 2012-08-09 ENCOUNTER — Encounter (HOSPITAL_COMMUNITY): Payer: Self-pay

## 2012-08-09 DIAGNOSIS — R109 Unspecified abdominal pain: Secondary | ICD-10-CM | POA: Insufficient documentation

## 2012-08-09 DIAGNOSIS — R55 Syncope and collapse: Secondary | ICD-10-CM

## 2012-08-09 DIAGNOSIS — O99891 Other specified diseases and conditions complicating pregnancy: Secondary | ICD-10-CM | POA: Insufficient documentation

## 2012-08-09 DIAGNOSIS — R0602 Shortness of breath: Secondary | ICD-10-CM | POA: Insufficient documentation

## 2012-08-09 DIAGNOSIS — O26899 Other specified pregnancy related conditions, unspecified trimester: Secondary | ICD-10-CM

## 2012-08-09 LAB — CBC
HCT: 34.8 % — ABNORMAL LOW (ref 36.0–46.0)
Hemoglobin: 11.9 g/dL — ABNORMAL LOW (ref 12.0–15.0)
MCH: 29.9 pg (ref 26.0–34.0)
MCV: 87.4 fL (ref 78.0–100.0)
RBC: 3.98 MIL/uL (ref 3.87–5.11)

## 2012-08-09 LAB — COMPREHENSIVE METABOLIC PANEL
CO2: 25 mEq/L (ref 19–32)
Calcium: 9.2 mg/dL (ref 8.4–10.5)
Chloride: 100 mEq/L (ref 96–112)
Creatinine, Ser: 0.79 mg/dL (ref 0.50–1.10)
GFR calc Af Amer: >90 mL/min (ref >90)
GFR calc non Af Amer: >90 mL/min (ref >90)
Glucose, Bld: 84 mg/dL (ref 70–99)
Total Bilirubin: 0.2 mg/dL — ABNORMAL LOW (ref 0.3–1.2)

## 2012-08-09 LAB — URINALYSIS, ROUTINE W REFLEX MICROSCOPIC
Bilirubin Urine: NEGATIVE
Glucose, UA: NEGATIVE mg/dL
Hgb urine dipstick: NEGATIVE
Specific Gravity, Urine: 1.02 (ref 1.005–1.030)
pH: 6.5 (ref 5.0–8.0)

## 2012-08-09 MED ORDER — SIMETHICONE 80 MG PO CHEW
80.0000 mg | CHEWABLE_TABLET | Freq: Once | ORAL | Status: AC
  Administered 2012-08-09: 80 mg via ORAL
  Filled 2012-08-09: qty 1

## 2012-08-09 NOTE — MAU Note (Signed)
Patient states she was seen by her MD yesterday and had a positive pregnancy test. Today she states she feels tired and has shortness of breath and feels dizzy and like she is going to pass out. Denies bleeding, discharge, pain, nausea or vomiting.

## 2012-08-09 NOTE — MAU Provider Note (Signed)
History     CSN: [id number]  Arrival date and time: 08/09/12 1345   First Provider Initiated Contact with Patient 08/09/12 1453      Chief Complaint  Patient presents with  . Shortness of Breath   HPI  Pt is [redacted]w[redacted]d pregnant and had a positive pregnany test yesterday at [facility] - with new OB appointment scheduled Aug 30, 2012. Pt was seen on 08/01/2012 with fatigue and had neg UPT in MAU- TSH was done which was normal.  She just started feeling light headed and out of breath yesterday and feeling dizzy. Pt thought she was going to pass out today.  Pt denies cough.  Pt has had some mild cramping.  Pt denies bleeding or unusual discharge.  Pt is on Keflex for UTI which was diagnosed on 08/01/2012.  Pt denies nausea, vomiting, constipation or diarrhea, fever or  UTI symptoms  Past Medical History  Diagnosis Date  . Heart murmur     childhood  . Abnormal Pap smear   . Chlamydia     Past Surgical History  Procedure Laterality Date  . Tooth extraction      Family History  Problem Relation Age of Onset  . Anesthesia problems Neg Hx   . Alcohol abuse Neg Hx   . Arthritis Neg Hx   . Asthma Neg Hx   . Birth defects Neg Hx   . Cancer Neg Hx   . COPD Neg Hx   . Depression Neg Hx   . Diabetes Neg Hx   . Drug abuse Neg Hx   . Early death Neg Hx   . Hearing loss Neg Hx   . Heart disease Neg Hx   . Hyperlipidemia Neg Hx   . Hypertension Neg Hx   . Kidney disease Neg Hx   . Learning disabilities Neg Hx   . Mental illness Neg Hx   . Mental retardation Neg Hx   . Miscarriages / Stillbirths Neg Hx   . Stroke Neg Hx   . Vision loss Neg Hx   . Other Neg Hx     History  Substance Use Topics  . Smoking status: Never Smoker   . Smokeless tobacco: Never Used  . Alcohol Use: Yes     Comment: Special occas.    Allergies: No Known Allergies  Prescriptions prior to admission  Medication Sig Dispense Refill  . cephALEXin (KEFLEX) 500 MG capsule Take 1 capsule (500 mg total) by mouth  3 (three) times daily.  21 capsule  0  . Prenatal Vit-Fe Fumarate-FA (MULTIVITAMIN-PRENATAL) 27-0.8 MG TABS Take 1 tablet by mouth daily.        Review of Systems  Constitutional: Negative for fever and chills.  Gastrointestinal: Positive for nausea and abdominal pain. Negative for vomiting, diarrhea and constipation.       Diffuse upper abdominal pain- discomfort  Genitourinary: Negative for dysuria and urgency.  Musculoskeletal: Negative for back pain.  Neurological: Positive for dizziness. Negative for tingling, tremors and headaches.   Physical Exam   Blood pressure 106/61, pulse 76, resp. rate 16, height 5\' 8"  (1.727 m), weight 79.107 kg (174 lb 6.4 oz), last menstrual period 07/03/2012, SpO2 100.00%.  Physical Exam  Nursing note and vitals reviewed. Constitutional: She is oriented to person, place, and time. She appears well-developed and well-nourished.  HENT:  Head: Normocephalic.  Neck: Normal range of motion. Neck supple.  Cardiovascular: Normal rate.   Respiratory: Effort normal.  GI: Soft. Bowel sounds are normal. She exhibits  no distension. There is tenderness. There is no rebound and no guarding.  Upper abd tenderness- no rebound; tympany  Musculoskeletal: Normal range of motion.  Neurological: She is alert and oriented to person, place, and time.  Skin: Skin is warm and dry.  Psychiatric: She has a normal mood and affect.    MAU Course  Procedures  Results for orders placed during the hospital encounter of 08/09/12 (from the past 24 hour(s))  URINALYSIS, ROUTINE W REFLEX MICROSCOPIC     Status: None   Collection Time    08/09/12  2:15 PM      Result Value Range   Color, Urine YELLOW  YELLOW   APPearance CLEAR  CLEAR   Specific Gravity, Urine 1.020  1.005 - 1.030   pH 6.5  5.0 - 8.0   Glucose, UA NEGATIVE  NEGATIVE mg/dL   Hgb urine dipstick NEGATIVE  NEGATIVE   Bilirubin Urine NEGATIVE  NEGATIVE   Ketones, ur NEGATIVE  NEGATIVE mg/dL   Protein, ur  NEGATIVE  NEGATIVE mg/dL   Urobilinogen, UA 0.2  0.0 - 1.0 mg/dL   Nitrite NEGATIVE  NEGATIVE   Leukocytes, UA NEGATIVE  NEGATIVE  CBC     Status: Abnormal   Collection Time    08/09/12  3:10 PM      Result Value Range   WBC 7.2  4.0 - 10.5 K/uL   RBC 3.98  3.87 - 5.11 MIL/uL   Hemoglobin 11.9 (*) 12.0 - 15.0 g/dL   HCT 21.3 (*) 08.6 - 57.8 %   MCV 87.4  78.0 - 100.0 fL   MCH 29.9  26.0 - 34.0 pg   MCHC 34.2  30.0 - 36.0 g/dL   RDW 46.9  62.9 - 52.8 %   Platelets 166  150 - 400 K/uL  COMPREHENSIVE METABOLIC PANEL     Status: Abnormal   Collection Time    08/09/12  3:10 PM      Result Value Range   Sodium 134 (*) 135 - 145 mEq/L   Potassium 3.6  3.5 - 5.1 mEq/L   Chloride 100  96 - 112 mEq/L   CO2 25  19 - 32 mEq/L   Glucose, Bld 84  70 - 99 mg/dL   BUN 15  6 - 23 mg/dL   Creatinine, Ser 4.13  0.50 - 1.10 mg/dL   Calcium 9.2  8.4 - 24.4 mg/dL   Total Protein 7.3  6.0 - 8.3 g/dL   Albumin 3.9  3.5 - 5.2 g/dL   AST 20  0 - 37 U/L   ALT 11  0 - 35 U/L   Alkaline Phosphatase 42  39 - 117 U/L   Total Bilirubin 0.2 (*) 0.3 - 1.2 mg/dL   GFR calc non Af Amer >90  >90 mL/min   GFR calc Af Amer >90  >90 mL/min  pt got relief with Mylicon Pt does not feel dizzy or light headed at this time. Labs within normal limits; vital signs stable; SpO2 100% Assessment and Plan  Upper Abdominal pain in pregnancy- advised small frequent meals and Mylicon if needed- continue exercise ?GERD diet given Near syncope- recommend increase in fluids, information on near syncope given Pt advised to follow up with [facility] for new OB appointment and ultrasound next Friday as scheduled- f/u sooner if increase in symptoms   Tanya Bailey 08/09/2012, 2:54 PM

## 2012-08-11 ENCOUNTER — Inpatient Hospital Stay (HOSPITAL_COMMUNITY): Payer: Medicaid Other

## 2012-08-11 ENCOUNTER — Encounter (HOSPITAL_COMMUNITY): Payer: Self-pay | Admitting: *Deleted

## 2012-08-11 ENCOUNTER — Inpatient Hospital Stay (HOSPITAL_COMMUNITY)
Admission: AD | Admit: 2012-08-11 | Discharge: 2012-08-11 | Disposition: A | Discharge status: Home / Self Care | Payer: Medicaid Other | Source: Ambulatory Visit | Attending: Obstetrics and Gynecology | Admitting: Obstetrics and Gynecology

## 2012-08-11 DIAGNOSIS — R109 Unspecified abdominal pain: Secondary | ICD-10-CM | POA: Insufficient documentation

## 2012-08-11 DIAGNOSIS — O4690 Antepartum hemorrhage, unspecified, unspecified trimester: Secondary | ICD-10-CM

## 2012-08-11 DIAGNOSIS — O200 Threatened abortion: Secondary | ICD-10-CM

## 2012-08-11 DIAGNOSIS — O209 Hemorrhage in early pregnancy, unspecified: Secondary | ICD-10-CM | POA: Insufficient documentation

## 2012-08-11 LAB — CBC
MCV: 87.5 fL (ref 78.0–100.0)
Platelets: 155 10*3/uL (ref 150–400)
RBC: 3.83 MIL/uL — ABNORMAL LOW (ref 3.87–5.11)
RDW: 13.3 % (ref 11.5–15.5)
WBC: 7.9 10*3/uL (ref 4.0–10.5)

## 2012-08-11 LAB — WET PREP, GENITAL
Clue Cells Wet Prep HPF POC: NONE SEEN
Trich, Wet Prep: NONE SEEN
Yeast Wet Prep HPF POC: NONE SEEN

## 2012-08-11 LAB — POCT PREGNANCY, URINE: Preg Test, Ur: POSITIVE — AB

## 2012-08-11 LAB — URINALYSIS, ROUTINE W REFLEX MICROSCOPIC
Bilirubin Urine: NEGATIVE
Nitrite: NEGATIVE
Protein, ur: NEGATIVE mg/dL
Specific Gravity, Urine: 1.02 (ref 1.005–1.030)
Urobilinogen, UA: 1 mg/dL (ref 0.0–1.0)

## 2012-08-11 LAB — URINE MICROSCOPIC-ADD ON

## 2012-08-11 LAB — HCG, QUANTITATIVE, PREGNANCY: hCG, Beta Chain, Quant, S: 229 m[IU]/mL — ABNORMAL HIGH (ref <5)

## 2012-08-11 MED ORDER — OXYCODONE-ACETAMINOPHEN 5-325 MG PO TABS
1.0000 | ORAL_TABLET | Freq: Once | ORAL | Status: AC
  Administered 2012-08-11: 1 via ORAL
  Filled 2012-08-11: qty 1

## 2012-08-11 MED ORDER — OXYCODONE-ACETAMINOPHEN 5-325 MG PO TABS: 1.0000 | ORAL_TABLET | Freq: Four times a day (QID) | ORAL | Status: DC | PRN

## 2012-08-11 NOTE — MAU Note (Signed)
Pt reports pinkish discharge yesterday, started using 3 day yeast treatment, this pm started having bright red bleeding and cramping. Cramping is worsening.

## 2012-08-11 NOTE — MAU Provider Note (Signed)
Chief Complaint: Vaginal Bleeding and Abdominal Pain   First Provider Initiated Contact with Patient 08/11/12 2103     SUBJECTIVE HPI: Tanya Bailey is a 27 y.o. [id number] at [redacted]w[redacted]d by LMP who presents with pink discharge yesterday and bright red bleeding and moderate, worsing cramping today. [place] planned next week at [facility]. Called Dr. Ambrose Mantle. Told RN she was instructed to come to MAU for eval, [place]. Denies fever, chills, passage of tissue, urinary complaints, GI complaints. No testing done yet this pregnancy per pt.   Past Medical History  Diagnosis Date  . Heart murmur     childhood  . Abnormal Pap smear   . Chlamydia    OB History   Grav Para Term Preterm Abortions TAB SAB Ect Mult Living   6 4 1 3 1  0 1 0 0 3     # Outc Date GA Lbr Len/2nd Wgt Sex Del Anes PTL Lv   1 TRM            2 PRE            3 PRE            4 PRE            5 SAB            6 CUR              Past Surgical History  Procedure Laterality Date  . Tooth extraction     History   Social History  . Marital Status: Married    Spouse Name: N/A    Number of Children: N/A  . Years of Education: N/A   Occupational History  . Not on file.   Social History Main Topics  . Smoking status: Never Smoker   . Smokeless tobacco: Never Used  . Alcohol Use: Yes     Comment: Special occas.  . Drug Use: No  . Sexually Active: Yes    Birth Control/ Protection: None   Other Topics Concern  . Not on file   Social History Narrative  . No narrative on file   No current facility-administered medications on file prior to encounter.   Current Outpatient Prescriptions on File Prior to Encounter  Medication Sig Dispense Refill  . cephALEXin (KEFLEX) 500 MG capsule Take 1 capsule (500 mg total) by mouth 3 (three) times daily.  21 capsule  0  . Prenatal Vit-Fe Fumarate-FA (MULTIVITAMIN-PRENATAL) 27-0.8 MG TABS Take 1 tablet by mouth daily.       No Known Allergies  ROS: Pertinent items in  HPI  OBJECTIVE Blood pressure 123/70, pulse 70, temperature 98 F (36.7 C), temperature source Oral, resp. rate 18, height 5' 7.75" (1.721 m), weight 78.926 kg (174 lb), last menstrual period 07/03/2012, SpO2 100.00%. GENERAL: Well-developed, well-nourished female in moderate distress, clutching abdomen.  HEENT: Normocephalic HEART: normal rate RESP: normal effort ABDOMEN: Soft, mild SP tenderness. Pos BS x 4.  EXTREMITIES: Nontender, no edema NEURO: Alert and oriented SPECULUM EXAM: NEFG, small amount of bright red blood and mucus noted coming out of os, cervix clean BIMANUAL: cervix closed; uterus normal size, mild bilat adnexal and ML tenderness. No masses or CMT.   LAB RESULTS Results for orders placed during the hospital encounter of 08/11/12 (from the past 24 hour(s))  URINALYSIS, ROUTINE W REFLEX MICROSCOPIC     Status: Abnormal   Collection Time    08/11/12  7:57 PM      Result Value Range  Color, Urine YELLOW  YELLOW   APPearance CLEAR  CLEAR   Specific Gravity, Urine 1.020  1.005 - 1.030   pH 7.0  5.0 - 8.0   Glucose, UA NEGATIVE  NEGATIVE mg/dL   Hgb urine dipstick SMALL (*) NEGATIVE   Bilirubin Urine NEGATIVE  NEGATIVE   Ketones, ur 15 (*) NEGATIVE mg/dL   Protein, ur NEGATIVE  NEGATIVE mg/dL   Urobilinogen, UA 1.0  0.0 - 1.0 mg/dL   Nitrite NEGATIVE  NEGATIVE   Leukocytes, UA NEGATIVE  NEGATIVE  URINE MICROSCOPIC-ADD ON     Status: None   Collection Time    08/11/12  7:57 PM      Result Value Range   Squamous Epithelial / LPF RARE  RARE   WBC, UA 0-2  <3 WBC/hpf   RBC / HPF 0-2  <3 RBC/hpf   Bacteria, UA RARE  RARE   Urine-Other MUCOUS PRESENT    POCT PREGNANCY, URINE     Status: Abnormal   Collection Time    08/11/12  8:13 PM      Result Value Range   Preg Test, Ur POSITIVE (*) NEGATIVE  ABO/RH     Status: None   Collection Time    08/11/12  9:00 PM      Result Value Range   ABO/RH(D) O POS    HCG, QUANTITATIVE, PREGNANCY     Status: Abnormal    Collection Time    08/11/12  9:00 PM      Result Value Range   hCG, Beta Chain, Quant, S 229 (*) <5 mIU/mL  CBC     Status: Abnormal   Collection Time    08/11/12  9:00 PM      Result Value Range   WBC 7.9  4.0 - 10.5 K/uL   RBC 3.83 (*) 3.87 - 5.11 MIL/uL   Hemoglobin 11.6 (*) 12.0 - 15.0 g/dL   HCT 95.6 (*) 38.7 - 56.4 %   MCV 87.5  78.0 - 100.0 fL   MCH 30.3  26.0 - 34.0 pg   MCHC 34.6  30.0 - 36.0 g/dL   RDW 33.2  95.1 - 88.4 %   Platelets 155  150 - 400 K/uL  WET PREP, GENITAL     Status: Abnormal   Collection Time    08/11/12 10:15 PM      Result Value Range   Yeast Wet Prep HPF POC NONE SEEN  NONE SEEN   Trich, Wet Prep NONE SEEN  NONE SEEN   Clue Cells Wet Prep HPF POC NONE SEEN  NONE SEEN   WBC, Wet Prep HPF POC FEW (*) NONE SEEN    IMAGING US Ob Comp Less 14 Wks  08/11/2012  *RADIOLOGY REPORT*  Clinical Data: Vaginal bleeding and pelvic cramping.  OBSTETRIC <14 WK [place] AND TRANSVAGINAL OB US  Technique:  Both transabdominal and transvaginal ultrasound examinations were performed for complete evaluation of the gestation as well as the maternal uterus, adnexal regions, and pelvic cul-de-sac.  Transvaginal technique was performed to assess early pregnancy.  Comparison:  None.  Intrauterine gestational sac:  None seen. Yolk sac: N/A Embryo: N/A  Maternal uterus/adnexae: The uterus is unremarkable in appearance.  At the right adnexa, note is made of a vague focal area of heterogeneous mildly increased echogenicity adjacent to the right ovary, measuring 1.4 x 0.8 x 0.9 cm.  There is associated focal tenderness, and ectopic pregnancy cannot be excluded.  However, this is not specific for ectopic pregnancy, especially  given the beta HCG level of 229.  No associated color Doppler blood flow is noted on limited Doppler evaluation.  The ovaries are otherwise unremarkable in appearance.  The right ovary measures 2.9 x 1.6 x 1.6 cm, while the left ovary measures 2.8 x 1.4 x 1.6 cm. There is  no evidence for ovarian torsion.  No free fluid is seen in the pelvic cul-de-sac.  IMPRESSION: Question of small ectopic pregnancy at the right adnexa, adjacent to the right ovary, measuring 1.4 x 3.8 x 0.9 cm.  Associated focal tenderness noted.  However, this is not specific, especially given the quantitative beta HCG level of 229.  Would perform follow-up quantitative beta HCG level in 48 hours, and follow-up pelvic ultrasound in 5-7 days, as deemed clinically appropriate, to ensure there is no intrauterine pregnancy, and assess for interval growth.  These results were called by telephone on 08/11/2012 at 09:50 p.m. to Ginger RN in the [facility] MAU, who verbally acknowledged these results.   Original Report Authenticated By: Tonia Ghent, M.D.    US Ob Transvaginal  08/11/2012  *RADIOLOGY REPORT*  Clinical Data: Vaginal bleeding and pelvic cramping.  OBSTETRIC <14 WK [place] AND TRANSVAGINAL OB US  Technique:  Both transabdominal and transvaginal ultrasound examinations were performed for complete evaluation of the gestation as well as the maternal uterus, adnexal regions, and pelvic cul-de-sac.  Transvaginal technique was performed to assess early pregnancy.  Comparison:  None.  Intrauterine gestational sac:  None seen. Yolk sac: N/A Embryo: N/A  Maternal uterus/adnexae: The uterus is unremarkable in appearance.  At the right adnexa, note is made of a vague focal area of heterogeneous mildly increased echogenicity adjacent to the right ovary, measuring 1.4 x 0.8 x 0.9 cm.  There is associated focal tenderness, and ectopic pregnancy cannot be excluded.  However, this is not specific for ectopic pregnancy, especially given the beta HCG level of 229.  No associated color Doppler blood flow is noted on limited Doppler evaluation.  The ovaries are otherwise unremarkable in appearance.  The right ovary measures 2.9 x 1.6 x 1.6 cm, while the left ovary measures 2.8 x 1.4 x 1.6 cm. There is no evidence for  ovarian torsion.  No free fluid is seen in the pelvic cul-de-sac.  IMPRESSION: Question of small ectopic pregnancy at the right adnexa, adjacent to the right ovary, measuring 1.4 x 3.8 x 0.9 cm.  Associated focal tenderness noted.  However, this is not specific, especially given the quantitative beta HCG level of 229.  Would perform follow-up quantitative beta HCG level in 48 hours, and follow-up pelvic ultrasound in 5-7 days, as deemed clinically appropriate, to ensure there is no intrauterine pregnancy, and assess for interval growth.  These results were called by telephone on 08/11/2012 at 09:50 p.m. to Ginger RN in the [facility] MAU, who verbally acknowledged these results.   Original Report Authenticated By: Tonia Ghent, M.D.    MAU COURSE Pain slightly improved w/ 1 percocet. Much better w/ second dose of same. Dr. Ambrose Mantle notified of US showing possible right ectopic, non-specific findings, no free fluid per [place]. Instructed pt t/ F/U in office for quants.   ASSESSMENT 1. Vaginal bleeding in pregnancy, first trimester   2. Possible right ectopic vs threatened miscarriage    PLAN Discharge home Ectopic/SAB precautions. Pelvic rest.     Follow-up Information   Follow up with [facility] On 08/15/2012. (for blood work)    Solicitor information:   [address]  [address], [place] [address] [place] [address] [phone]      Follow up with [facility] [place] [facility]. (As needed if symptoms worsen)    Contact information:   [address] [place] [address] [phone]       Medication List    TAKE these medications       cephALEXin 500 MG capsule  Commonly known as:  KEFLEX  Take 1 capsule (500 mg total) by mouth 3 (three) times daily.     multivitamin-prenatal 27-0.8 MG Tabs  Take 1 tablet by mouth daily.     oxyCODONE-acetaminophen 5-325 MG per tablet  Commonly known as:  PERCOCET/ROXICET  Take 1-2 tablets by mouth every 6  (six) hours as needed for pain.       [place], CNM 08/11/2012  10:59 PM

## 2012-08-15 ENCOUNTER — Inpatient Hospital Stay (HOSPITAL_COMMUNITY)
Admission: AD | Admit: 2012-08-15 | Discharge: 2012-08-15 | Discharge status: Against Medical Advice | Payer: Medicaid Other | Source: Ambulatory Visit | Attending: Obstetrics and Gynecology | Admitting: Obstetrics and Gynecology

## 2012-08-15 NOTE — MAU Note (Signed)
Pt waiting for triage, states the wait is going to be long, wants to leave & come back later.  Pt encouraged to return.

## 2012-08-21 ENCOUNTER — Inpatient Hospital Stay (HOSPITAL_COMMUNITY)
Admission: AD | Admit: 2012-08-21 | Discharge: 2012-08-21 | Disposition: A | Discharge status: Home / Self Care | Payer: Medicaid Other | Source: Ambulatory Visit | Attending: Obstetrics and Gynecology | Admitting: Obstetrics and Gynecology

## 2012-08-21 DIAGNOSIS — O009 Unspecified ectopic pregnancy without intrauterine pregnancy: Secondary | ICD-10-CM

## 2012-08-21 DIAGNOSIS — O00109 Unspecified tubal pregnancy without intrauterine pregnancy: Secondary | ICD-10-CM | POA: Insufficient documentation

## 2012-08-21 LAB — HCG, QUANTITATIVE, PREGNANCY: hCG, Beta Chain, Quant, S: 194 m[IU]/mL — ABNORMAL HIGH (ref <5)

## 2012-08-21 NOTE — MAU Note (Signed)
Pt presents to evaluate pregnancy due to hormone levels not rising. Dr Ambrose Mantle told her to follow up here when she was seen by him on Friday.

## 2012-08-21 NOTE — MAU Provider Note (Signed)
History     CSN: [id number]  Arrival date & time 08/21/12  1355   None     No chief complaint on file.   (Consider location/radiation/quality/duration/timing/severity/associated sxs/prior treatment) HPI Tanya Bailey is a 27 y.o. [id number]. She presents for MTX injection. She had 1st MAU visit 4/17. BHCG was 229, U/S showed vague focal area hetrogeneous mildly increased echogenicety adjacent to  R ovary measuring 1.4x0.8x0.9 cm. This was not dx as ectopic at that time. F/U BHCG in the office 4/21 was 233, on 4/25 was 215. She was counseled on management and recommended MTX that she was to come to MAU and receive. She did not get the MTX.  She has no pain or bleeding. 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 Past Medical History  Diagnosis Date  . Heart murmur     childhood  . Abnormal Pap smear   . Chlamydia     Past Surgical History  Procedure Laterality Date  . Tooth extraction      Family History  Problem Relation Age of Onset  . Anesthesia problems Neg Hx   . Alcohol abuse Neg Hx   . Arthritis Neg Hx   . Asthma Neg Hx   . Birth defects Neg Hx   . Cancer Neg Hx   . COPD Neg Hx   . Depression Neg Hx   . Diabetes Neg Hx   . Drug abuse Neg Hx   . Early death Neg Hx   . Hearing loss Neg Hx   . Heart disease Neg Hx   . Hyperlipidemia Neg Hx   . Hypertension Neg Hx   . Kidney disease Neg Hx   . Learning disabilities Neg Hx   . Mental illness Neg Hx   . Mental retardation Neg Hx   . Miscarriages / Stillbirths Neg Hx   . Stroke Neg Hx   . Vision loss Neg Hx   . Other Neg Hx     History  Substance Use Topics  . Smoking status: Never Smoker   . Smokeless tobacco: Never Used  . Alcohol Use: Yes     Comment: Special occas.    OB History   Grav Para Term Preterm Abortions TAB SAB Ect Mult Living   6 4 1 3 1  0 1 0 0 3      Review of Systems  Constitutional: Negative for fever and chills.  Gastrointestinal: Negative for  abdominal pain.  Genitourinary: Negative for vaginal bleeding and pelvic pain.  Neurological: Negative for dizziness, syncope and light-headedness.    Allergies  Review of patient's allergies indicates no known allergies.  Home Medications  No current outpatient prescriptions on file.  BP 105/71  Pulse 72  Temp(Src) 98.2 F (36.8 C) (Oral)  Resp 18  Ht 5\' 7"  (1.702 m)  Wt 170 lb (77.111 kg)  BMI 26.62 kg/m2  LMP 07/03/2012  Physical Exam  Constitutional: She is oriented to person, place, and time. She appears well-developed and well-nourished.  Musculoskeletal: Normal range of motion.  Neurological: She is alert and oriented to person, place, and time.  Psychiatric: She has a normal mood and affect. Her behavior is normal.    ED Course  Procedures (including critical care time)  Labs Reviewed  HCG, QUANTITATIVE, PREGNANCY - Abnormal; Notable for the following:    hCG, Beta Chain, Quant, S 194 (*)    All other components within normal limits   ASSESSMENT:  BHCG is slowly dropping, was  215 on 4/25, is 194 today. Dr Ambrose Mantle offered to continue to watch BHCG's as long as pt has no pain. Pt would like to do that, did not want MTX unless absolutely had to.  Will check BHCG q w.  PLAN:  Pt is to call the office Monday for an appt Friday 5/2 for BHCG Pt is to call the office with abd pain, dizziness/fainting She wants to start OCP's ASAP-to remind provider when BHCG's get low.     MDM

## 2012-08-21 NOTE — MAU Note (Signed)
Dr Lang Snow notified of pt's arrival and concerns. Orders received.

## 2013-01-23 ENCOUNTER — Inpatient Hospital Stay (HOSPITAL_COMMUNITY)
Admission: AD | Admit: 2013-01-23 | Discharge: 2013-01-23 | Disposition: A | Discharge status: Home / Self Care | Payer: Medicaid Other | Source: Ambulatory Visit | Attending: Obstetrics & Gynecology | Admitting: Obstetrics & Gynecology

## 2013-01-23 ENCOUNTER — Encounter (HOSPITAL_COMMUNITY): Payer: Self-pay | Admitting: *Deleted

## 2013-01-23 DIAGNOSIS — K2970 Gastritis, unspecified, without bleeding: Secondary | ICD-10-CM | POA: Insufficient documentation

## 2013-01-23 DIAGNOSIS — R1013 Epigastric pain: Secondary | ICD-10-CM | POA: Insufficient documentation

## 2013-01-23 LAB — URINALYSIS, ROUTINE W REFLEX MICROSCOPIC
Bilirubin Urine: NEGATIVE
Hgb urine dipstick: NEGATIVE
Ketones, ur: NEGATIVE mg/dL
Nitrite: NEGATIVE
Specific Gravity, Urine: 1.02 (ref 1.005–1.030)
Urobilinogen, UA: 0.2 mg/dL (ref 0.0–1.0)
pH: 7.5 (ref 5.0–8.0)

## 2013-01-23 MED ORDER — GI COCKTAIL ~~LOC~~
30.0000 mL | Freq: Once | ORAL | Status: AC
  Administered 2013-01-23: 30 mL via ORAL
  Filled 2013-01-23: qty 30

## 2013-01-23 MED ORDER — OMEPRAZOLE 20 MG PO CPDR: 20.0000 mg | DELAYED_RELEASE_CAPSULE | Freq: Every day | ORAL | Status: DC

## 2013-01-23 NOTE — MAU Provider Note (Signed)
History     CSN: [id number]  Arrival date and time: 01/23/13 2132   First Provider Initiated Contact with Patient 01/23/13 2223      No chief complaint on file.  HPI [name] is a 27 y.o. [phone] who presents to MAU today with nausea and burning in her epigastric region. The patient has also had some fatigue recently. She denies vomiting, GERD, indigestion or heartburn. LMP was 12/26/12. She denies vaginal bleeding, discharge, abdominal pain, fever or changes in diet.   OB History   Grav Para Term Preterm Abortions TAB SAB Ect Mult Living   5 4 1 3 1  0 0 1 0 3      Past Medical History  Diagnosis Date  . Heart murmur     childhood  . Abnormal Pap smear   . Chlamydia     Past Surgical History  Procedure Laterality Date  . Tooth extraction      Family History  Problem Relation Age of Onset  . Anesthesia problems Neg Hx   . Alcohol abuse Neg Hx   . Arthritis Neg Hx   . Asthma Neg Hx   . Birth defects Neg Hx   . Cancer Neg Hx   . COPD Neg Hx   . Depression Neg Hx   . Diabetes Neg Hx   . Drug abuse Neg Hx   . Early death Neg Hx   . Hearing loss Neg Hx   . Heart disease Neg Hx   . Hyperlipidemia Neg Hx   . Hypertension Neg Hx   . Kidney disease Neg Hx   . Learning disabilities Neg Hx   . Mental illness Neg Hx   . Mental retardation Neg Hx   . Miscarriages / Stillbirths Neg Hx   . Stroke Neg Hx   . Vision loss Neg Hx   . Other Neg Hx     History  Substance Use Topics  . Smoking status: Never Smoker   . Smokeless tobacco: Never Used  . Alcohol Use: Yes     Comment: Special occas.    Allergies: No Known Allergies  Prescriptions prior to admission  Medication Sig Dispense Refill  . BIOTIN PO Take 2 capsules by mouth 2 (two) times daily.      Marland Kitchen omega-3 acid ethyl esters (LOVAZA) 1 G capsule Take 1-2 g by mouth 2 (two) times daily. 1 capsule in the morning and 2 capsules at night.      . Prenatal Vit-Fe Fumarate-FA (MULTIVITAMIN-PRENATAL) 27-0.8  MG TABS Take 1 tablet by mouth daily.        Review of Systems  Constitutional: Positive for malaise/fatigue. Negative for fever.  Gastrointestinal: Positive for nausea and abdominal pain. Negative for vomiting, diarrhea and constipation.  Genitourinary: Negative for dysuria, urgency and frequency.       Neg - vaginal discharge, bleeding  Neurological: Negative for weakness.   Physical Exam   Blood pressure 108/72, pulse 73, temperature 98.4 F (36.9 C), temperature source Oral, resp. rate 20, height 5\' 8"  (1.727 m), weight 185 lb 6 oz (84.086 kg), last menstrual period 12/26/2012, unknown if currently breastfeeding.  Physical Exam  Constitutional: She is oriented to person, place, and time. She appears well-developed and well-nourished.  HENT:  Head: Normocephalic and atraumatic.  Cardiovascular: Normal rate, regular rhythm and normal heart sounds.   Respiratory: Effort normal and breath sounds normal. No respiratory distress.  GI: Soft. Bowel sounds are normal. She exhibits no distension and no mass. There  is tenderness (mild epigastric tenderness to palpation). There is no rebound and no guarding.  Neurological: She is alert and oriented to person, place, and time.  Skin: Skin is warm and dry. No erythema.  Psychiatric: She has a normal mood and affect.   Results for orders placed during the hospital encounter of 01/23/13 (from the past 24 hour(s))  URINALYSIS, ROUTINE W REFLEX MICROSCOPIC     Status: None   Collection Time    01/23/13  9:55 PM      Result Value Range   Color, Urine YELLOW  YELLOW   APPearance CLEAR  CLEAR   Specific Gravity, Urine 1.020  1.005 - 1.030   pH 7.5  5.0 - 8.0   Glucose, UA NEGATIVE  NEGATIVE mg/dL   Hgb urine dipstick NEGATIVE  NEGATIVE   Bilirubin Urine NEGATIVE  NEGATIVE   Ketones, ur NEGATIVE  NEGATIVE mg/dL   Protein, ur NEGATIVE  NEGATIVE mg/dL   Urobilinogen, UA 0.2  0.0 - 1.0 mg/dL   Nitrite NEGATIVE  NEGATIVE   Leukocytes, UA NEGATIVE   NEGATIVE  POCT PREGNANCY, URINE     Status: None   Collection Time    01/23/13 10:04 PM      Result Value Range   Preg Test, Ur NEGATIVE  NEGATIVE    MAU Course  Procedures None  MDM GI cocktail - patient reports significant improvement in symptoms  Assessment and Plan  A: Gastritis  P: Discharge home Rx for Prilosec #14 with R1 sent to patient's pharmacy Patient given information on AVS about Gastritis Patient advised to follow-up with PCP if symptoms persist or worsen Patient may return to MAU as needed  Freddi Starr, PA-C  01/23/2013, 10:24 PM

## 2013-01-23 NOTE — MAU Note (Addendum)
PT SAYS SHE USUALLY HAVE CYCLE ON 19TH OF EACH MTH,  BUT  IN AUG - NO CYCLE AND  THIS  WAS 9-1.Marland Kitchen  HOME PREG TEST-  NEG.    NO PAIN.  FEELS NAUSEA-    STARTED ON SAT.    LAST SEX-   Thursday-.   NO BIRTH CONTROL.      SAID OTHER PREG WAS WITH  [place],  [place], CHILL.  ECTOPIC WAS WITH DR Ambrose Mantle

## 2013-01-24 NOTE — MAU Provider Note (Signed)
Attestation of Attending Supervision of Advanced Practitioner (CNM/NP): Evaluation and management procedures were performed by the Advanced Practitioner under my supervision and collaboration.  I have reviewed the Advanced Practitioner's note and chart, and I agree with the management and plan.  HARRAWAY-SMITH, Delores Thelen 1:57 AM

## 2013-03-02 ENCOUNTER — Other Ambulatory Visit: Payer: Self-pay

## 2013-03-29 ENCOUNTER — Inpatient Hospital Stay (HOSPITAL_COMMUNITY)
Admission: AD | Admit: 2013-03-29 | Discharge: 2013-03-29 | Disposition: A | Discharge status: Home / Self Care | Payer: Medicaid Other | Source: Ambulatory Visit | Attending: Obstetrics and Gynecology | Admitting: Obstetrics and Gynecology

## 2013-03-29 ENCOUNTER — Encounter (HOSPITAL_COMMUNITY): Payer: Self-pay

## 2013-03-29 DIAGNOSIS — N946 Dysmenorrhea, unspecified: Secondary | ICD-10-CM | POA: Insufficient documentation

## 2013-03-29 DIAGNOSIS — R109 Unspecified abdominal pain: Secondary | ICD-10-CM | POA: Insufficient documentation

## 2013-03-29 LAB — URINALYSIS, ROUTINE W REFLEX MICROSCOPIC
Bilirubin Urine: NEGATIVE
Glucose, UA: NEGATIVE mg/dL
Ketones, ur: NEGATIVE mg/dL
Leukocytes, UA: NEGATIVE
Nitrite: NEGATIVE
Protein, ur: NEGATIVE mg/dL
Specific Gravity, Urine: >1.03 — ABNORMAL HIGH (ref 1.005–1.030)
Urobilinogen, UA: 0.2 mg/dL (ref 0.0–1.0)
pH: 6 (ref 5.0–8.0)

## 2013-03-29 LAB — CBC
HCT: 35.9 % — ABNORMAL LOW (ref 36.0–46.0)
Hemoglobin: 12.4 g/dL (ref 12.0–15.0)
MCH: 30.2 pg (ref 26.0–34.0)
MCHC: 34.5 g/dL (ref 30.0–36.0)
MCV: 87.6 fL (ref 78.0–100.0)
Platelets: 148 10*3/uL — ABNORMAL LOW (ref 150–400)
RBC: 4.1 MIL/uL (ref 3.87–5.11)
RDW: 12.6 % (ref 11.5–15.5)
WBC: 4.3 10*3/uL (ref 4.0–10.5)

## 2013-03-29 LAB — WET PREP, GENITAL
Clue Cells Wet Prep HPF POC: NONE SEEN
Trich, Wet Prep: NONE SEEN
Yeast Wet Prep HPF POC: NONE SEEN

## 2013-03-29 MED ORDER — IBUPROFEN 800 MG PO TABS: 800.0000 mg | ORAL_TABLET | Freq: Three times a day (TID) | ORAL | Status: DC

## 2013-03-29 MED ORDER — KETOROLAC TROMETHAMINE 60 MG/2ML IM SOLN
60.0000 mg | Freq: Once | INTRAMUSCULAR | Status: AC
  Administered 2013-03-29: 60 mg via INTRAMUSCULAR
  Filled 2013-03-29: qty 2

## 2013-03-29 NOTE — MAU Note (Signed)
Patient is in with c/o new onset of intense abdominal pain and heavy bleeding. She states that she had an ectopic pregnancy and this pain feels like that. The pad she came in with has a spot. lmp 11/2. Patient states that she is actively trting to concieve

## 2013-03-29 NOTE — MAU Provider Note (Signed)
History     CSN: [id number]  Arrival date and time: 03/29/13 9629   First Provider Initiated Contact with Patient 03/29/13 1029      Chief Complaint  Patient presents with  . Vaginal Bleeding  . Abdominal Pain   HPI Comments: Tanya Bailey 27 y.o. [id number] presents to MAU with severe menstrual cramps. She awoke this am with pain that she says is a 10/10. She had a tubal pregnancy in April. She has not taken any medications for this pain.   Vaginal Bleeding Associated symptoms include abdominal pain.  Abdominal Pain      Past Medical History  Diagnosis Date  . Heart murmur     childhood  . Abnormal Pap smear   . Chlamydia     Past Surgical History  Procedure Laterality Date  . Tooth extraction      Family History  Problem Relation Age of Onset  . Anesthesia problems Neg Hx   . Alcohol abuse Neg Hx   . Arthritis Neg Hx   . Asthma Neg Hx   . Birth defects Neg Hx   . Cancer Neg Hx   . COPD Neg Hx   . Depression Neg Hx   . Diabetes Neg Hx   . Drug abuse Neg Hx   . Early death Neg Hx   . Hearing loss Neg Hx   . Heart disease Neg Hx   . Hyperlipidemia Neg Hx   . Hypertension Neg Hx   . Kidney disease Neg Hx   . Learning disabilities Neg Hx   . Mental illness Neg Hx   . Mental retardation Neg Hx   . Miscarriages / Stillbirths Neg Hx   . Stroke Neg Hx   . Vision loss Neg Hx   . Other Neg Hx     History  Substance Use Topics  . Smoking status: Never Smoker   . Smokeless tobacco: Never Used  . Alcohol Use: Yes     Comment: Special occas.    Allergies: No Known Allergies  Prescriptions prior to admission  Medication Sig Dispense Refill  . BIOTIN PO Take 2 capsules by mouth 2 (two) times daily.      Marland Kitchen omega-3 acid ethyl esters (LOVAZA) 1 G capsule Take 2 g by mouth daily. 1 capsule in the morning and 2 capsules at night.      . Prenatal Vit-Fe Fumarate-FA (MULTIVITAMIN-PRENATAL) 27-0.8 MG TABS Take 1 tablet by mouth daily.        Review of  Systems  Constitutional: Negative.   HENT: Negative.   Eyes: Negative.   Respiratory: Negative.   Cardiovascular: Negative.   Gastrointestinal: Positive for abdominal pain.  Genitourinary: Negative.   Musculoskeletal: Negative.   Skin: Negative.   Neurological: Negative.   Psychiatric/Behavioral: Negative.    Physical Exam   Blood pressure 101/64, pulse 62, temperature 97.7 F (36.5 C), temperature source Oral, resp. rate 20, height 5\' 8"  (1.727 m), weight 83.519 kg (184 lb 2 oz), last menstrual period 02/26/2013, SpO2 100.00%.  Physical Exam  Constitutional: She is oriented to person, place, and time. She appears well-developed and well-nourished. No distress.  HENT:  Head: Normocephalic and atraumatic.  Cardiovascular: Normal rate, regular rhythm and normal heart sounds.   Respiratory: Effort normal and breath sounds normal.  GI: Soft. Bowel sounds are normal. There is tenderness.  Genitourinary:  Genitalia: External bloody Vagina: moderate amount blood/ 6 fox swabs full Cervix negative Biman: Uterus tender  Musculoskeletal: Normal range of motion.  Neurological:  She is alert and oriented to person, place, and time.  Skin: Skin is warm and dry.  Psychiatric: She has a normal mood and affect. Her behavior is normal. Judgment and thought content normal.   Results for orders placed during the hospital encounter of 03/29/13 (from the past 24 hour(s))  URINALYSIS, ROUTINE W REFLEX MICROSCOPIC     Status: Abnormal   Collection Time    03/29/13  9:30 AM      Result Value Range   Color, Urine YELLOW  YELLOW   APPearance CLEAR  CLEAR   Specific Gravity, Urine >1.030 (*) 1.005 - 1.030   pH 6.0  5.0 - 8.0   Glucose, UA NEGATIVE  NEGATIVE mg/dL   Hgb urine dipstick LARGE (*) NEGATIVE   Bilirubin Urine NEGATIVE  NEGATIVE   Ketones, ur NEGATIVE  NEGATIVE mg/dL   Protein, ur NEGATIVE  NEGATIVE mg/dL   Urobilinogen, UA 0.2  0.0 - 1.0 mg/dL   Nitrite NEGATIVE  NEGATIVE    Leukocytes, UA NEGATIVE  NEGATIVE  POCT PREGNANCY, URINE     Status: None   Collection Time    03/29/13  9:38 AM      Result Value Range   Preg Test, Ur NEGATIVE  NEGATIVE  CBC     Status: Abnormal   Collection Time    03/29/13  9:48 AM      Result Value Range   WBC 4.3  4.0 - 10.5 K/uL   RBC 4.10  3.87 - 5.11 MIL/uL   Hemoglobin 12.4  12.0 - 15.0 g/dL   HCT 16.1 (*) 09.6 - 04.5 %   MCV 87.6  78.0 - 100.0 fL   MCH 30.2  26.0 - 34.0 pg   MCHC 34.5  30.0 - 36.0 g/dL   RDW 40.9  81.1 - 91.4 %   Platelets 148 (*) 150 - 400 K/uL  WET PREP, GENITAL     Status: Abnormal   Collection Time    03/29/13 11:58 AM      Result Value Range   Yeast Wet Prep HPF POC NONE SEEN  NONE SEEN   Trich, Wet Prep NONE SEEN  NONE SEEN   Clue Cells Wet Prep HPF POC NONE SEEN  NONE SEEN   WBC, Wet Prep HPF POC FEW (*) NONE SEEN    MAU Course  Procedures  MDM  Wet prep/ GC/ Chlamydia Toradol 60 mg IM/ pain went from 10/10 to 0/10  Assessment and Plan   A: Menstrual Cramps  P: Toradol 60 mg IM now  Follow up with dr Ambrose Mantle as needed   [name] 03/29/2013, 12:09 PM

## 2013-03-30 LAB — GC/CHLAMYDIA PROBE AMP
CT Probe RNA: NEGATIVE
GC Probe RNA: NEGATIVE

## 2013-08-08 LAB — OB RESULTS CONSOLE ANTIBODY SCREEN: Antibody Screen: NEGATIVE

## 2013-08-08 LAB — OB RESULTS CONSOLE ABO/RH: RH TYPE: POSITIVE

## 2013-08-08 LAB — OB RESULTS CONSOLE HEPATITIS B SURFACE ANTIGEN: Hepatitis B Surface Ag: NEGATIVE

## 2013-08-08 LAB — OB RESULTS CONSOLE GC/CHLAMYDIA
Chlamydia: NEGATIVE
Gonorrhea: NEGATIVE

## 2013-08-08 LAB — OB RESULTS CONSOLE RPR: RPR: NONREACTIVE

## 2013-08-08 LAB — OB RESULTS CONSOLE RUBELLA ANTIBODY, IGM: Rubella: IMMUNE

## 2014-01-10 LAB — OB RESULTS CONSOLE HIV ANTIBODY (ROUTINE TESTING): HIV: NONREACTIVE

## 2014-01-13 ENCOUNTER — Inpatient Hospital Stay (HOSPITAL_COMMUNITY)
Admission: AD | Admit: 2014-01-13 | Discharge: 2014-01-13 | Disposition: A | Discharge status: Home / Self Care | Payer: Medicaid Other | Source: Ambulatory Visit | Attending: Obstetrics and Gynecology | Admitting: Obstetrics and Gynecology

## 2014-01-13 ENCOUNTER — Encounter (HOSPITAL_COMMUNITY): Payer: Self-pay

## 2014-01-13 DIAGNOSIS — O4700 False labor before 37 completed weeks of gestation, unspecified trimester: Secondary | ICD-10-CM | POA: Diagnosis not present

## 2014-01-13 DIAGNOSIS — Z3686 Encounter for antenatal screening for cervical length: Secondary | ICD-10-CM

## 2014-01-13 LAB — FETAL FIBRONECTIN: Fetal Fibronectin: NEGATIVE

## 2014-01-13 MED ORDER — LACTATED RINGERS IV SOLN
INTRAVENOUS | Status: DC
  Administered 2014-01-13: 20:00:00 via INTRAVENOUS

## 2014-01-13 MED ORDER — TERBUTALINE SULFATE 1 MG/ML IJ SOLN
0.2500 mg | Freq: Once | INTRAMUSCULAR | Status: AC
  Administered 2014-01-13: 0.25 mg via SUBCUTANEOUS
  Filled 2014-01-13: qty 1

## 2014-01-13 NOTE — MAU Note (Signed)
Patient's little brother almost got hit by car and patient ran after him.  Afterwards started having sharp pelvic pain and then felt like contractions. Happened about 5:30 pm and pain came 30 min later.  Denies bleeding. Denies leaking. Baby moving well.  Other babies born at 5,35, and [redacted] weeks pregnant.

## 2014-01-13 NOTE — MAU Provider Note (Signed)
History     CSN: [id number]  Arrival date and time: 01/13/14 1901   None     Chief Complaint  Patient presents with  . Pelvic Pain  . Contractions   HPI Tanya Bailey is a 28 y.o. [id number]  at [redacted]w[redacted]d. She presents with c/o contractions since chasing her brother earlier today.  They are uncomfortable, not like Deberah Pelton. No leaking or bleeding. + FM.  OB History   Grav Para Term Preterm Abortions TAB SAB Ect Mult Living   0 0 1 0 3      Past Medical History  Diagnosis Date  . Heart murmur     childhood  . Abnormal Pap smear   . Chlamydia   . Preterm labor     Past Surgical History  Procedure Laterality Date  . Tooth extraction      Family History  Problem Relation Age of Onset  . Anesthesia problems Neg Hx   . Alcohol abuse Neg Hx   . Arthritis Neg Hx   . Asthma Neg Hx   . Birth defects Neg Hx   . Cancer Neg Hx   . COPD Neg Hx   . Depression Neg Hx   . Diabetes Neg Hx   . Drug abuse Neg Hx   . Early death Neg Hx   . Hearing loss Neg Hx   . Heart disease Neg Hx   . Hyperlipidemia Neg Hx   . Hypertension Neg Hx   . Kidney disease Neg Hx   . Learning disabilities Neg Hx   . Mental illness Neg Hx   . Mental retardation Neg Hx   . Miscarriages / Stillbirths Neg Hx   . Stroke Neg Hx   . Vision loss Neg Hx   . Other Neg Hx     History  Substance Use Topics  . Smoking status: Never Smoker   . Smokeless tobacco: Never Used  . Alcohol Use: Yes     Comment: Special occas.    Allergies: No Known Allergies  Prescriptions prior to admission  Medication Sig Dispense Refill  . BIOTIN PO Take 2 capsules by mouth 2 (two) times daily.      Marland Kitchen ibuprofen (ADVIL,MOTRIN) 800 MG tablet Take 1 tablet (800 mg total) by mouth 3 (three) times daily.  40 tablet  1  . omega-3 acid ethyl esters (LOVAZA) 1 G capsule Take 2 g by mouth daily. 1 capsule in the morning and 2 capsules at night.      . Prenatal Vit-Fe Fumarate-FA (MULTIVITAMIN-PRENATAL) 27-0.8  MG TABS Take 1 tablet by mouth daily.        Review of Systems  Constitutional: Negative for fever and chills.  Gastrointestinal: Positive for abdominal pain. Negative for nausea, vomiting, diarrhea and constipation.  Genitourinary: Negative for dysuria, urgency and frequency.   Physical Exam   Last menstrual period 02/26/2013.  Physical Exam  Constitutional: She is oriented to person, place, and time. She appears well-developed and well-nourished.  GI: Soft. There is no tenderness.  Gravid ~ 33 wks  Genitourinary:  Pelvic exam: Ext gen- nl anatomy, skin intact Vagina- small amt thick white discharge Cx- 1 cm, soft, long Uterus- gravid, 33 wk size Adn- non tender  Musculoskeletal: Normal range of motion.  Neurological: She is alert and oriented to person, place, and time.  Skin: Skin is warm and dry.  Psychiatric: She has a normal mood and affect. Her behavior is normal.  MAU Course  Procedures  MDM 1 liter of fluids, SQ Terb x 1, contractions have resolved, pt ready to go home  Assessment and Plan  [redacted] wks EGA with contractions, resolved after hydration, sq Terb x1 Reactive strip  Discharge home, pelvic rest Keep appt Thursday in the office  Tanya Bailey M. 01/13/2014, 7:46 PM

## 2014-01-30 ENCOUNTER — Inpatient Hospital Stay (HOSPITAL_COMMUNITY)
Admission: AD | Admit: 2014-01-30 | Discharge: 2014-01-30 | DRG: 778 | Disposition: A | Discharge status: Home / Self Care | Payer: Medicaid Other | Source: Ambulatory Visit | Attending: Obstetrics and Gynecology | Admitting: Obstetrics and Gynecology

## 2014-01-30 ENCOUNTER — Encounter (HOSPITAL_COMMUNITY): Payer: Self-pay | Admitting: *Deleted

## 2014-01-30 DIAGNOSIS — Z3A35 35 weeks gestation of pregnancy: Secondary | ICD-10-CM | POA: Diagnosis present

## 2014-01-30 LAB — CBC
HCT: 36.5 % (ref 36.0–46.0)
HEMOGLOBIN: 12.8 g/dL (ref 12.0–15.0)
MCH: 31.9 pg (ref 26.0–34.0)
MCHC: 35.1 g/dL (ref 30.0–36.0)
MCV: 91 fL (ref 78.0–100.0)
Platelets: 140 10*3/uL — ABNORMAL LOW (ref 150–400)
RBC: 4.01 MIL/uL (ref 3.87–5.11)
RDW: 13.5 % (ref 11.5–15.5)
WBC: 9.3 10*3/uL (ref 4.0–10.5)

## 2014-01-30 LAB — HIV ANTIBODY (ROUTINE TESTING W REFLEX): HIV 1&2 Ab, 4th Generation: NONREACTIVE

## 2014-01-30 LAB — GROUP B STREP BY PCR: GROUP B STREP BY PCR: NEGATIVE

## 2014-01-30 LAB — OB RESULTS CONSOLE HIV ANTIBODY (ROUTINE TESTING): HIV: NONREACTIVE

## 2014-01-30 LAB — TYPE AND SCREEN
ABO/RH(D): O POS
ANTIBODY SCREEN: NEGATIVE

## 2014-01-30 LAB — RPR

## 2014-01-30 LAB — OB RESULTS CONSOLE GBS: GBS: NEGATIVE

## 2014-01-30 MED ORDER — DIPHENHYDRAMINE HCL 50 MG/ML IJ SOLN: 12.5000 mg | INTRAMUSCULAR | Status: DC | PRN

## 2014-01-30 MED ORDER — ONDANSETRON HCL 4 MG/2ML IJ SOLN: 4.0000 mg | Freq: Four times a day (QID) | INTRAMUSCULAR | Status: DC | PRN

## 2014-01-30 MED ORDER — PENICILLIN G POTASSIUM 5000000 UNITS IJ SOLR
2.5000 10*6.[IU] | INTRAVENOUS | Status: DC
  Administered 2014-01-30 (×3): 2.5 10*6.[IU] via INTRAVENOUS
  Filled 2014-01-30 (×6): qty 2.5

## 2014-01-30 MED ORDER — PENICILLIN G POTASSIUM 5000000 UNITS IJ SOLR
5.0000 10*6.[IU] | Freq: Once | INTRAVENOUS | Status: AC
  Administered 2014-01-30: 5 10*6.[IU] via INTRAVENOUS
  Filled 2014-01-30: qty 5

## 2014-01-30 MED ORDER — LIDOCAINE HCL (PF) 1 % IJ SOLN: 30.0000 mL | INTRAMUSCULAR | Status: DC | PRN

## 2014-01-30 MED ORDER — LACTATED RINGERS IV SOLN: 500.0000 mL | INTRAVENOUS | Status: DC | PRN

## 2014-01-30 MED ORDER — OXYCODONE-ACETAMINOPHEN 5-325 MG PO TABS: 1.0000 | ORAL_TABLET | ORAL | Status: DC | PRN

## 2014-01-30 MED ORDER — EPHEDRINE 5 MG/ML INJ: 10.0000 mg | INTRAVENOUS | Status: DC | PRN

## 2014-01-30 MED ORDER — TERBUTALINE SULFATE 1 MG/ML IJ SOLN: 0.2500 mg | Freq: Once | INTRAMUSCULAR | Status: DC

## 2014-01-30 MED ORDER — OXYTOCIN BOLUS FROM INFUSION: 500.0000 mL | INTRAVENOUS | Status: DC

## 2014-01-30 MED ORDER — PHENYLEPHRINE 40 MCG/ML (10ML) SYRINGE FOR IV PUSH (FOR BLOOD PRESSURE SUPPORT): 80.0000 ug | PREFILLED_SYRINGE | INTRAVENOUS | Status: DC | PRN

## 2014-01-30 MED ORDER — FENTANYL 2.5 MCG/ML BUPIVACAINE 1/10 % EPIDURAL INFUSION (WH - ANES): 14.0000 mL/h | INTRAMUSCULAR | Status: DC | PRN

## 2014-01-30 MED ORDER — LACTATED RINGERS IV SOLN
INTRAVENOUS | Status: DC
  Administered 2014-01-30 (×2): via INTRAVENOUS

## 2014-01-30 MED ORDER — EPHEDRINE 5 MG/ML INJ
10.0000 mg | INTRAVENOUS | Status: DC | PRN
Start: 2014-01-30 — End: 2014-01-30

## 2014-01-30 MED ORDER — ACETAMINOPHEN 325 MG PO TABS: 650.0000 mg | ORAL_TABLET | ORAL | Status: DC | PRN

## 2014-01-30 MED ORDER — CITRIC ACID-SODIUM CITRATE 334-500 MG/5ML PO SOLN: 30.0000 mL | ORAL | Status: DC | PRN

## 2014-01-30 MED ORDER — LACTATED RINGERS IV SOLN: 500.0000 mL | Freq: Once | INTRAVENOUS | Status: DC

## 2014-01-30 MED ORDER — OXYTOCIN 40 UNITS IN LACTATED RINGERS INFUSION - SIMPLE MED: 62.5000 mL/h | INTRAVENOUS | Status: DC

## 2014-01-30 MED ORDER — OXYCODONE-ACETAMINOPHEN 5-325 MG PO TABS: 2.0000 | ORAL_TABLET | ORAL | Status: DC | PRN

## 2014-01-30 MED ORDER — BUTORPHANOL TARTRATE 1 MG/ML IJ SOLN: 1.0000 mg | INTRAMUSCULAR | Status: DC | PRN

## 2014-01-30 NOTE — Progress Notes (Signed)
Dr Ambrose MantleHenley gives order for terbutaline 0.25mg  sq in order to relieve patient from contraction pain.  Pt does not wish to take injection.  Dr Ambrose MantleHenley notified of this and says that patient does not have to have it, and that we will just wait and see what her body does on its own.

## 2014-01-30 NOTE — Progress Notes (Signed)
Patient ID: Tanya Bailey, female   DOB: 01-Oct-1985, 28 y.o.   MRN: [id number] Pt was admitted as a laboring pt but has failed to make progress. She was felt to be 5 cm dilated but the cervix was only 50 % effaced. She was begun on penicillin for unknown GBS status. Her contractions have continued to be 2-5 minutes apart and the pt rates them at 6-7 out of 10. She was examined by Dr. Senaida Oresichardson in the office yesterday and was 2 cm 50 % effaced. The FHR tracing is normal. I examined the pt and the cervix is 3-4 cm 50 % effaced and the PP is no lower than - 4 station. It is obvious that she is not in labor but since her contractions are regular I will observe her for several more hours before considering D/C. I offered her terbutaline but she wants to see what her body will do .

## 2014-01-30 NOTE — Discharge Instructions (Signed)
Preterm Labor Information Preterm labor is when labor starts before you are [redacted] weeks pregnant. The normal length of pregnancy is 39 to 41 weeks.  CAUSES  The cause of preterm labor is not often known. The most common known cause is infection. RISK FACTORS  Having a history of preterm labor.  Having your water break before it should.  Having a placenta that covers the opening of the cervix.  Having a placenta that breaks away from the uterus.  Having a cervix that is too weak to hold the baby in the uterus.  Having too much fluid in the amniotic sac.  Taking drugs or smoking while pregnant.  Not gaining enough weight while pregnant.  Being younger than 2618 and older than 28 years old.  Having a low income.  Being African American. SYMPTOMS  Period-like cramps, belly (abdominal) pain, or back pain.  Contractions that are regular, as often as six in an hour. They may be mild or painful.  Contractions that start at the top of the belly. They then move to the lower belly and back.  Lower belly pressure that seems to get stronger.  Bleeding from the vagina.  Fluid leaking from the vagina. TREATMENT  Treatment depends on:  Your condition.  The condition of your baby.  How many weeks pregnant you are. Your doctor may have you:  Take medicine to stop contractions.  Stay in bed except to use the restroom (bed rest).  Stay in the hospital. WHAT SHOULD YOU DO IF YOU THINK YOU ARE IN PRETERM LABOR? Call your doctor right away. You need to go to the hospital right away.  HOW CAN YOU PREVENT PRETERM LABOR IN FUTURE PREGNANCIES?  Stop smoking, if you smoke.  Maintain healthy weight gain.  Do not take drugs or be around chemicals that are not needed.  Tell your doctor if you think you have an infection.  Tell your doctor if you had a preterm labor before. Document Released: 07/10/2008 Document Revised: 02/01/2013 Document Reviewed: 07/10/2008 [facility] Patient  Information 2015 [place], [place]. This information is not intended to replace advice given to you by your health care provider. Make sure you discuss any questions you have with your health care provider. Fetal Movement Counts Patient Name: __________________________________________________ Patient Due Date: ____________________ Performing a fetal movement count is highly recommended in high-risk pregnancies, but it is good for every pregnant woman to do. Your health care provider may ask you to start counting fetal movements at 28 weeks of the pregnancy. Fetal movements often increase:  After eating a full meal.  After physical activity.  After eating or drinking something sweet or cold.  At rest. Pay attention to when you feel the baby is most active. This will help you notice a pattern of your baby's sleep and wake cycles and what factors contribute to an increase in fetal movement. It is important to perform a fetal movement count at the same time each day when your baby is normally most active.  HOW TO COUNT FETAL MOVEMENTS 1. Find a quiet and comfortable area to sit or lie down on your left side. Lying on your left side provides the best blood and oxygen circulation to your baby. 2. Write down the day and time on a sheet of paper or in a journal. 3. Start counting kicks, flutters, swishes, rolls, or jabs in a 2-hour period. You should feel at least 10 movements within 2 hours. 4. If you do not feel 10 movements in 2  hours, wait 2-3 hours and count again. Look for a change in the pattern or not enough counts in 2 hours. SEEK MEDICAL CARE IF:  You feel less than 10 counts in 2 hours, tried twice.  There is no movement in over an hour.  The pattern is changing or taking longer each day to reach 10 counts in 2 hours.  You feel the baby is not moving as he or she usually does. Date: ____________ Movements: ____________ Start time: ____________ Doreatha MartinFinish time: ____________  Date: ____________  Movements: ____________ Start time: ____________ Doreatha MartinFinish time: ____________ Date: ____________ Movements: ____________ Start time: ____________ Doreatha MartinFinish time: ____________ Date: ____________ Movements: ____________ Start time: ____________ Doreatha MartinFinish time: ____________ Date: ____________ Movements: ____________ Start time: ____________ Doreatha MartinFinish time: ____________ Date: ____________ Movements: ____________ Start time: ____________ Doreatha MartinFinish time: ____________ Date: ____________ Movements: ____________ Start time: ____________ Doreatha MartinFinish time: ____________ Date: ____________ Movements: ____________ Start time: ____________ Doreatha MartinFinish time: ____________  Date: ____________ Movements: ____________ Start time: ____________ Doreatha MartinFinish time: ____________ Date: ____________ Movements: ____________ Start time: ____________ Doreatha MartinFinish time: ____________ Date: ____________ Movements: ____________ Start time: ____________ Doreatha MartinFinish time: ____________ Date: ____________ Movements: ____________ Start time: ____________ Doreatha MartinFinish time: ____________ Date: ____________ Movements: ____________ Start time: ____________ Doreatha MartinFinish time: ____________ Date: ____________ Movements: ____________ Start time: ____________ Doreatha MartinFinish time: ____________ Date: ____________ Movements: ____________ Start time: ____________ Doreatha MartinFinish time: ____________  Date: ____________ Movements: ____________ Start time: ____________ Doreatha MartinFinish time: ____________ Date: ____________ Movements: ____________ Start time: ____________ Doreatha MartinFinish time: ____________ Date: ____________ Movements: ____________ Start time: ____________ Doreatha MartinFinish time: ____________ Date: ____________ Movements: ____________ Start time: ____________ Doreatha MartinFinish time: ____________ Date: ____________ Movements: ____________ Start time: ____________ Doreatha MartinFinish time: ____________ Date: ____________ Movements: ____________ Start time: ____________ Doreatha MartinFinish time: ____________ Date: ____________ Movements: ____________ Start time:  ____________ Doreatha MartinFinish time: ____________  Date: ____________ Movements: ____________ Start time: ____________ Doreatha MartinFinish time: ____________ Date: ____________ Movements: ____________ Start time: ____________ Doreatha MartinFinish time: ____________ Date: ____________ Movements: ____________ Start time: ____________ Doreatha MartinFinish time: ____________ Date: ____________ Movements: ____________ Start time: ____________ Doreatha MartinFinish time: ____________ Date: ____________ Movements: ____________ Start time: ____________ Doreatha MartinFinish time: ____________ Date: ____________ Movements: ____________ Start time: ____________ Doreatha MartinFinish time: ____________ Date: ____________ Movements: ____________ Start time: ____________ Doreatha MartinFinish time: ____________  Date: ____________ Movements: ____________ Start time: ____________ Doreatha MartinFinish time: ____________ Date: ____________ Movements: ____________ Start time: ____________ Doreatha MartinFinish time: ____________ Date: ____________ Movements: ____________ Start time: ____________ Doreatha MartinFinish time: ____________ Date: ____________ Movements: ____________ Start time: ____________ Doreatha MartinFinish time: ____________ Date: ____________ Movements: ____________ Start time: ____________ Doreatha MartinFinish time: ____________ Date: ____________ Movements: ____________ Start time: ____________ Doreatha MartinFinish time: ____________ Date: ____________ Movements: ____________ Start time: ____________ Doreatha MartinFinish time: ____________  Date: ____________ Movements: ____________ Start time: ____________ Doreatha MartinFinish time: ____________ Date: ____________ Movements: ____________ Start time: ____________ Doreatha MartinFinish time: ____________ Date: ____________ Movements: ____________ Start time: ____________ Doreatha MartinFinish time: ____________ Date: ____________ Movements: ____________ Start time: ____________ Doreatha MartinFinish time: ____________ Date: ____________ Movements: ____________ Start time: ____________ Doreatha MartinFinish time: ____________ Date: ____________ Movements: ____________ Start time: ____________ Doreatha MartinFinish time: ____________ Date:  ____________ Movements: ____________ Start time: ____________ Doreatha MartinFinish time: ____________  Date: ____________ Movements: ____________ Start time: ____________ Doreatha MartinFinish time: ____________ Date: ____________ Movements: ____________ Start time: ____________ Doreatha MartinFinish time: ____________ Date: ____________ Movements: ____________ Start time: ____________ Doreatha MartinFinish time: ____________ Date: ____________ Movements: ____________ Start time: ____________ Doreatha MartinFinish time: ____________ Date: ____________ Movements: ____________ Start time: ____________ Doreatha MartinFinish time: ____________ Date: ____________ Movements: ____________ Start time: ____________ Doreatha MartinFinish time: ____________ Date: ____________ Movements: ____________ Start time: ____________ Doreatha MartinFinish time: ____________  Date: ____________ Movements: ____________ Start time: ____________ Doreatha MartinFinish time: ____________ Date: ____________  Movements: ____________ Start time: ____________ Doreatha Martin time: ____________ Date: ____________ Movements: ____________ Start time: ____________ Doreatha Martin time: ____________ Date: ____________ Movements: ____________ Start time: ____________ Doreatha Martin time: ____________ Date: ____________ Movements: ____________ Start time: ____________ Doreatha Martin time: ____________ Date: ____________ Movements: ____________ Start time: ____________ Doreatha Martin time: ____________ Document Released: 05/13/2006 Document Revised: 08/28/2013 Document Reviewed: 02/08/2012 ExitCare Patient Information 2015 [place], [place]. This information is not intended to replace advice given to you by your health care provider. Make sure you discuss any questions you have with your health care provider.

## 2014-01-30 NOTE — MAU Provider Note (Signed)
Chief Complaint:  No chief complaint on file.   First Provider Initiated Contact with Patient 01/30/14 [phone]      HPI: Tanya Bailey is a 28 y.o. [id number] at 38w3dwho presents to maternity admissions reporting contractions starting last night, worsening ~3 hours ago, becoming 3 minutes apart.  She was seen in the office today and reports she was 1 cm dilated.  She has hx of preterm birth x2 at 61 and 36 weeks.  She was on 17P for 6-8 weeks but stopped the injections because she was having side effects.  She reports good fetal movement, denies LOF, vaginal bleeding, vaginal itching/burning, urinary symptoms, h/a, dizziness, n/v, or fever/chills.  Past Medical History: Past Medical History  Diagnosis Date  . Heart murmur     childhood  . Abnormal Pap smear   . Chlamydia   . Preterm labor     Past obstetric history: OB History  Gravida Para Term Preterm AB SAB TAB Ectopic Multiple Living  6 4 1 3 1  0 0 1 0 3    # Outcome Date GA Lbr Len/2nd Weight Sex Delivery Anes PTL Lv  6 CUR           5 ECT           4 PRE     M SVD None  Y     Comments: 36 weeks  3 PRE     F SVD None  Y     Comments: 35 weeks  2 PRE     M SVD None  N     Comments: 23 weeks, contractions and water broke.  1 TRM      SVD None  Y      Past Surgical History: Past Surgical History  Procedure Laterality Date  . Tooth extraction      Family History: Family History  Problem Relation Age of Onset  . Anesthesia problems Neg Hx   . Alcohol abuse Neg Hx   . Arthritis Neg Hx   . Asthma Neg Hx   . Birth defects Neg Hx   . Cancer Neg Hx   . COPD Neg Hx   . Depression Neg Hx   . Diabetes Neg Hx   . Drug abuse Neg Hx   . Early death Neg Hx   . Hearing loss Neg Hx   . Heart disease Neg Hx   . Hyperlipidemia Neg Hx   . Hypertension Neg Hx   . Kidney disease Neg Hx   . Learning disabilities Neg Hx   . Mental illness Neg Hx   . Mental retardation Neg Hx   . Miscarriages / Stillbirths Neg Hx   . Stroke  Neg Hx   . Vision loss Neg Hx   . Other Neg Hx     Social History: History  Substance Use Topics  . Smoking status: Never Smoker   . Smokeless tobacco: Never Used  . Alcohol Use: Yes     Comment: Special occas. when not pregnant    Allergies: No Known Allergies  Meds:  Prescriptions prior to admission  Medication Sig Dispense Refill  . BIOTIN PO Take 2 capsules by mouth 2 (two) times daily.      Marland Kitchen ibuprofen (ADVIL,MOTRIN) 800 MG tablet Take 1 tablet (800 mg total) by mouth 3 (three) times daily.  40 tablet  1  . omega-3 acid ethyl esters (LOVAZA) 1 G capsule Take 2 g by mouth daily. 1 capsule in the morning and 2  capsules at night.      . Prenatal Vit-Fe Fumarate-FA (MULTIVITAMIN-PRENATAL) 27-0.8 MG TABS Take 1 tablet by mouth daily.        ROS: Pertinent findings in history of present illness.  Physical Exam  Last menstrual period 02/26/2013. GENERAL: Well-developed, well-nourished female in no acute distress.  HEENT: normocephalic HEART: normal rate RESP: normal effort ABDOMEN: Soft, non-tender, gravid appropriate for gestational age EXTREMITIES: Nontender, no edema NEURO: alert and oriented  Dilation: 4.5 Effacement (%): 50 Station: -2 Presentation: Vertex Exam by:: Leftwich-Kirby,CNM  FHT:  Baseline 135, moderate variability, accelerations present, no decelerations Contractions: 2-3 minutes, mild to moderate to palpation    Assessment: 1. Preterm labor without delivery     Plan: Consult Dr Sen[facility]ardson [facility] collected PCN for GBS unknown RN to recheck pt in 2 hours, call Dr Senaida Oresichardson     Medication List    ASK your doctor about these medications       BIOTIN PO  Take 2 capsules by mouth 2 (two) times daily.     ibuprofen 800 MG tablet  Commonly known as:  ADVIL,MOTRIN  Take 1 tablet (800 mg total) by mouth 3 (three) times daily.     multivitamin-prenatal 27-0.8 MG Tabs tablet  Take 1 tablet by mouth daily.      omega-3 acid ethyl esters 1 G capsule  Commonly known as:  LOVAZA  Take 2 g by mouth daily. 1 capsule in the morning and 2 capsules at night.        Sharen CounterLisa Leftwich-Kirby Certified Nurse-Midwife 01/30/2014 3:43 AM

## 2014-01-30 NOTE — H&P (Signed)
Tanya Bailey is a 28 y.o. female  G6 1123 presenting for regular contractions and cervical change.  Pt was seen in the office 01/29/14 and was 2 cm dilated.  SHe presented to MAU at 0330 AM and was 4-5cm dilated with moderate contractions.  At 5 am, she was 50/5/-2 and seemed to be continuing to make change.  She had a GBS performed in the office 10/5 which is not back, so has been placed on GBS prophylaxis given her preterm status and a rapid GBS done on admission.  Pregnancy complicated by a h/o PTD for which it was recommended she receive weekly 17-P injections.  She was initially compliant, but missed several injections and then elected to d/c them at 27 weeks.  Her platelets run around 150K but are stable.  Maternal Medical History:  Reason for admission: Contractions.   Contractions: Onset was 6-12 hours ago.   Frequency: regular.   Perceived severity is moderate.    Fetal activity: Perceived fetal activity is normal.    Prenatal complications: Preterm labor.   Prenatal Complications - Diabetes: none.    OB History   Grav Para Term Preterm Abortions TAB SAB Ect Mult Living   6 4 1 3 1  0 0 1 0 3    2005 NSVD 39 weeks 5#3oz 2006 fetal demise at 25 weeks 2008 NSVD 35 weeks 5#14oz 2009 NSVD 36 weeks 6#12oz 2014 Ectopic treated with salpingectomy  Past Medical History  Diagnosis Date  . Heart murmur     childhood  . Abnormal Pap smear   . Chlamydia   . Preterm labor    Past Surgical History  Procedure Laterality Date  . Tooth extraction     Family History: family history is negative for Anesthesia problems, Alcohol abuse, Arthritis, Asthma, Birth defects, Cancer, COPD, Depression, Diabetes, Drug abuse, Early death, Hearing loss, Heart disease, Hyperlipidemia, Hypertension, Kidney disease, Learning disabilities, Mental illness, Mental retardation, Miscarriages / Stillbirths, Stroke, Vision loss, and Other. Social History:  reports that she has never smoked. She has never  used smokeless tobacco. She reports that she drinks alcohol. She reports that she does not use illicit drugs.   Prenatal Transfer Tool  Maternal Diabetes: No Genetic Screening: Normal Maternal Ultrasounds/Referrals: Normal Fetal Ultrasounds or other Referrals:  None Maternal Substance Abuse:  No Significant Maternal Medications:  None Significant Maternal Lab Results:  GBS unknown Other Comments:  None  ROS  Dilation: 5 Effacement (%): 60 Station: -2 Exam by:: Veronica mensah Blood pressure 113/71, pulse 67, temperature 98 F (36.7 C), temperature source Oral, resp. rate 18, height 5\' 7"  (1.702 m), weight 101.152 kg (223 lb), last menstrual period 02/26/2013. Maternal Exam:  Uterine Assessment: Contraction strength is moderate.  Contraction frequency is regular.   Abdomen: Patient reports no abdominal tenderness. Fetal presentation: vertex  Introitus: Normal vulva. Normal vagina.    Physical Exam  Constitutional: She appears well-developed and well-nourished.  Cardiovascular: Normal rate and regular rhythm.   Respiratory: Effort normal.  GI: Soft.  Genitourinary: Vagina normal.  Psychiatric: She has a normal mood and affect.    Prenatal labs: ABO, Rh: --/--/O POS (10/06 0981) Antibody: NEG (10/06 0415) Rubella: Immune (04/14 0000) RPR: Nonreactive (04/14 0000)  HBsAg: Negative (04/14 0000)  HIV:   Negative GBS: unknown Hgb AA First trimester screen and AFP negative One hour GTT 107   Assessment/Plan: Pt admitted with presumed early labor and cervical change from 2cm to 5cm.  She has not changed substantially in the last  2 hours, so is just being observed for now.  Will allow IV stadol but not epidural for now.  Prenatals erroneously said HIV positive--I have confirmed negative and corrected them.  Oliver PilaICHARDSON,Luvina Poirier W 01/30/2014, 6:22 AM

## 2014-01-30 NOTE — MAU Note (Signed)
Pt states that she is having contractions that are more intense and regular than the braxton hicks that she has been having. Denies SROM, denies vag bleeding, states positive fetal movement. Denies any risk factors for pregnancy. [facility] Denies current STI's.

## 2014-01-30 NOTE — Progress Notes (Signed)
Strip reviewed with Neysa Bonitohristy, Charity fundraiserN. Patient may transfer to room 163.

## 2014-01-30 NOTE — Progress Notes (Signed)
Patient ID: Tanya Bailey, female   DOB: 30-Nov-1985, 28 y.o.   MRN: [id number] Pt has continued to contract at frequent intervals. The FHR looks normal. The cervix is 3-4 cm 50 % effaced and the vertex is at -3/-4 station. I will observe her another 4-6 hours and reexamine her if nothing of significance occurs before then.

## 2014-01-30 NOTE — Progress Notes (Signed)
Patient ID: Tanya Bailey, female   DOB: 02-13-1986, 28 y.o.   MRN: [id number] The pt has been observed for more than 12 hours and the cervix is still 3-4 cm 50 % effaced and the vertex is at - 3 station. The FHR is normal. I have instructed the pt that if her contractions become < 5 minutes apart and are more painful ,she needs to return to the hospital for recheck.Since noon the contractions have spaced out to 2 to 8 minutes  And the majority are > 5 minutes apart

## 2014-01-31 NOTE — Discharge Summary (Signed)
NAMClaudia Bailey:  Habermehl, [name]             ACCOUNT NO.:  [id number]  MEDICAL RECORD NO.:  [id number]  LOCATION:  9163                          FACILITY:  [facility]  PHYSICIAN:  Malachi Prohomas F. Ambrose MantleHenley, M.D. DATE OF BIRTH:  04/15/1986  DATE OF ADMISSION:  01/30/2014 DATE OF DISCHARGE:  01/30/2014                              DISCHARGE SUMMARY   PRESENT ILLNESS:  This is a 28 year old black female, para 1-2-2-3, gravida 6 presented for regular contractions and cervical change.  She was seen in the office on January 29, 2014. and was 2 cm dilated.  She presented to maternity admission unit at 3:30 am on January 30, 2014 and was 4-5 cm dilated with moderate contractions.  At 5:00 am she was thought to be 5 cm 50%, vertex at a -2.  She was placed on penicillin for prophylaxis because her group B strep was unknown, pregnancy had been complicated by history of preterm delivery, and was recommended she receive weekly 17 progesterone injections.  She was initially compliant with these several injections and then elected to discontinue them at 27 weeks.  PAST MEDICAL HISTORY:  Revealed in May 2005, she had a spontaneous delivery of a 39 week 5 pounds 3 ounce infant; in 2006, she had fetal demise at 25 weeks; in 2008, a spontaneous delivery at 35 weeks; in 2009, a spontaneous delivery at 36 weeks; in 2014, an ectopic pregnancy was treated with salpingectomy.  There is no significant medical history except for a history of chlamydia.  She has had a tooth extraction.  FAMILY HISTORY:  Negative for significant problems,  She has never smoked.  She has never used smokeless tobacco.  Does not drink.  No illicit drugs.  PHYSICAL EXAMINATION:  On admission by the maternity admission unit thought she was 5 cm.  She was then observed and thought to progress to 5-6 cm.  I initially saw her at 7:30 am, found her to be 3-4 cm, 50%, vertex with a -4 station.  She was observed for 12 hours since admission, cervix never  changed over the last 6 hours.  Her contractions have definitely spaced out. now they are 2-8 minutes apart and image Giardia greater than 5 minutes apart.  LABORATORY DATA:  Her prenatal labs: O positive, negative antibody, rubella immune, RPR nonreactive, hepatitis B surface antigen negative, HIV negative, group B strep unknown, hemoglobin AA.  First trimester screen and AFP negative.  One hour Glucola test was 107.  Since admission, her group B strep culture has come back negative.  At the present time, I am discharging her with strong instructions to call if she does start contracting painfully less than 5 minutes apart and advised her that she will need to be reexamined to see if she is in labor.  She has been given an adequate trial of observation. There is no indication that she is truly in labor and she is discharged.  FINAL DIAGNOSES:  Intrauterine pregnancy at 35+ weeks with false labor. The patient is instructed to call with the symptoms listed above and return to the office in 6 days if there are no changes.     Malachi Prohomas F. Ambrose MantleHenley, M.D.     TFH/MEDQ  D:  01/30/2014  T:  01/31/2014  Job:  [id number]

## 2014-02-06 ENCOUNTER — Inpatient Hospital Stay (HOSPITAL_COMMUNITY): Payer: Medicaid Other

## 2014-02-06 ENCOUNTER — Inpatient Hospital Stay (HOSPITAL_COMMUNITY)
Admission: AD | Admit: 2014-02-06 | Discharge: 2014-02-06 | Disposition: A | Discharge status: Home / Self Care | Payer: Medicaid Other | Source: Ambulatory Visit | Attending: Obstetrics and Gynecology | Admitting: Obstetrics and Gynecology

## 2014-02-06 ENCOUNTER — Encounter (HOSPITAL_COMMUNITY): Payer: Self-pay | Admitting: *Deleted

## 2014-02-06 DIAGNOSIS — Z3A36 36 weeks gestation of pregnancy: Secondary | ICD-10-CM | POA: Diagnosis not present

## 2014-02-06 DIAGNOSIS — O288 Other abnormal findings on antenatal screening of mother: Secondary | ICD-10-CM

## 2014-02-06 DIAGNOSIS — O368390 Maternal care for abnormalities of the fetal heart rate or rhythm, unspecified trimester, not applicable or unspecified: Secondary | ICD-10-CM

## 2014-02-06 NOTE — Progress Notes (Signed)
Report called to Dr Ellyn HackBovard who reviewed strip. Aware of variables, ctx pattern, sve x 2. Will do BPP

## 2014-02-06 NOTE — MAU Provider Note (Signed)
  Pt to MAU for labor check. FHTs 130-140, category 1, also 8/8 BPP

## 2014-02-06 NOTE — Discharge Instructions (Signed)
Fetal Movement Counts °Patient Name: __________________________________________________ Patient Due Date: ____________________ °Performing a fetal movement count is highly recommended in high-risk pregnancies, but it is good for every pregnant woman to do. Your health care provider may ask you to start counting fetal movements at 28 weeks of the pregnancy. Fetal movements often increase: °· After eating a full meal. °· After physical activity. °· After eating or drinking something sweet or cold. °· At rest. °Pay attention to when you feel the baby is most active. This will help you notice a pattern of your baby's sleep and wake cycles and what factors contribute to an increase in fetal movement. It is important to perform a fetal movement count at the same time each day when your baby is normally most active.  °HOW TO COUNT FETAL MOVEMENTS °1. Find a quiet and comfortable area to sit or lie down on your left side. Lying on your left side provides the best blood and oxygen circulation to your baby. °2. Write down the day and time on a sheet of paper or in a journal. °3. Start counting kicks, flutters, swishes, rolls, or jabs in a 2-hour period. You should feel at least 10 movements within 2 hours. °4. If you do not feel 10 movements in 2 hours, wait 2-3 hours and count again. Look for a change in the pattern or not enough counts in 2 hours. °SEEK MEDICAL CARE IF: °· You feel less than 10 counts in 2 hours, tried twice. °· There is no movement in over an hour. °· The pattern is changing or taking longer each day to reach 10 counts in 2 hours. °· You feel the baby is not moving as he or she usually does. °Date: ____________ Movements: ____________ Start time: ____________ Finish time: ____________  °Date: ____________ Movements: ____________ Start time: ____________ Finish time: ____________ °Date: ____________ Movements: ____________ Start time: ____________ Finish time: ____________ °Date: ____________ Movements:  ____________ Start time: ____________ Finish time: ____________ °Date: ____________ Movements: ____________ Start time: ____________ Finish time: ____________ °Date: ____________ Movements: ____________ Start time: ____________ Finish time: ____________ °Date: ____________ Movements: ____________ Start time: ____________ Finish time: ____________ °Date: ____________ Movements: ____________ Start time: ____________ Finish time: ____________  °Date: ____________ Movements: ____________ Start time: ____________ Finish time: ____________ °Date: ____________ Movements: ____________ Start time: ____________ Finish time: ____________ °Date: ____________ Movements: ____________ Start time: ____________ Finish time: ____________ °Date: ____________ Movements: ____________ Start time: ____________ Finish time: ____________ °Date: ____________ Movements: ____________ Start time: ____________ Finish time: ____________ °Date: ____________ Movements: ____________ Start time: ____________ Finish time: ____________ °Date: ____________ Movements: ____________ Start time: ____________ Finish time: ____________  °Date: ____________ Movements: ____________ Start time: ____________ Finish time: ____________ °Date: ____________ Movements: ____________ Start time: ____________ Finish time: ____________ °Date: ____________ Movements: ____________ Start time: ____________ Finish time: ____________ °Date: ____________ Movements: ____________ Start time: ____________ Finish time: ____________ °Date: ____________ Movements: ____________ Start time: ____________ Finish time: ____________ °Date: ____________ Movements: ____________ Start time: ____________ Finish time: ____________ °Date: ____________ Movements: ____________ Start time: ____________ Finish time: ____________  °Date: ____________ Movements: ____________ Start time: ____________ Finish time: ____________ °Date: ____________ Movements: ____________ Start time: ____________ Finish  time: ____________ °Date: ____________ Movements: ____________ Start time: ____________ Finish time: ____________ °Date: ____________ Movements: ____________ Start time: ____________ Finish time: ____________ °Date: ____________ Movements: ____________ Start time: ____________ Finish time: ____________ °Date: ____________ Movements: ____________ Start time: ____________ Finish time: ____________ °Date: ____________ Movements: ____________ Start time: ____________ Finish time: ____________  °Date: ____________ Movements: ____________ Start time: ____________ Finish   time: ____________ Date: ____________ Movements: ____________ Start time: ____________ Finish time: ____________ Date: ____________ Movements: ____________ Start time: ____________ Finish time: ____________ Date: ____________ Movements: ____________ Start time: ____________ Finish time: ____________ Date: ____________ Movements: ____________ Start time: ____________ Finish time: ____________ Date: ____________ Movements: ____________ Start time: ____________ Finish time: ____________ Date: ____________ Movements: ____________ Start time: ____________ Finish time: ____________  Date: ____________ Movements: ____________ Start time: ____________ Finish time: ____________ Date: ____________ Movements: ____________ Start time: ____________ Finish time: ____________ Date: ____________ Movements: ____________ Start time: ____________ Finish time: ____________ Date: ____________ Movements: ____________ Start time: ____________ Finish time: ____________ Date: ____________ Movements: ____________ Start time: ____________ Finish time: ____________ Date: ____________ Movements: ____________ Start time: ____________ Finish time: ____________ Date: ____________ Movements: ____________ Start time: ____________ Finish time: ____________  Date: ____________ Movements: ____________ Start time: ____________ Finish time: ____________ Date: ____________  Movements: ____________ Start time: ____________ Finish time: ____________ Date: ____________ Movements: ____________ Start time: ____________ Finish time: ____________ Date: ____________ Movements: ____________ Start time: ____________ Finish time: ____________ Date: ____________ Movements: ____________ Start time: ____________ Finish time: ____________ Date: ____________ Movements: ____________ Start time: ____________ Finish time: ____________ Date: ____________ Movements: ____________ Start time: ____________ Finish time: ____________  Date: ____________ Movements: ____________ Start time: ____________ Finish time: ____________ Date: ____________ Movements: ____________ Start time: ____________ Finish time: ____________ Date: ____________ Movements: ____________ Start time: ____________ Finish time: ____________ Date: ____________ Movements: ____________ Start time: ____________ Finish time: ____________ Date: ____________ Movements: ____________ Start time: ____________ Finish time: ____________ Date: ____________ Movements: ____________ Start time: ____________ Finish time: ____________ Document Released: 05/13/2006 Document Revised: 08/28/2013 Document Reviewed: 02/08/2012 [employer] Patient Information 2015 [employer]. This information is not intended to replace advice given to you by your health care provider. Make sure you discuss any questions you have with your health care provider. Fetal Movement Counts Patient Name: __________________________________________________ Patient Due Date: ____________________ Performing a fetal movement count is highly recommended in high-risk pregnancies, but it is good for every pregnant woman to do. Your health care provider may ask you to start counting fetal movements at 28 weeks of the pregnancy. Fetal movements often increase:  After eating a full meal.  After physical activity.  After eating or drinking something sweet or cold.  At  rest. Pay attention to when you feel the baby is most active. This will help you notice a pattern of your baby's sleep and wake cycles and what factors contribute to an increase in fetal movement. It is important to perform a fetal movement count at the same time each day when your baby is normally most active.  HOW TO COUNT FETAL MOVEMENTS 5. Find a quiet and comfortable area to sit or lie down on your left side. Lying on your left side provides the best blood and oxygen circulation to your baby. 6. Write down the day and time on a sheet of paper or in a journal. 7. Start counting kicks, flutters, swishes, rolls, or jabs in a 2-hour period. You should feel at least 10 movements within 2 hours. 8. If you do not feel 10 movements in 2 hours, wait 2-3 hours and count again. Look for a change in the pattern or not enough counts in 2 hours. SEEK MEDICAL CARE IF:  You feel less than 10 counts in 2 hours, tried twice.  There is no movement in over an hour.  The pattern is changing or taking longer each day to reach 10 counts in 2 hours.  You feel the baby is not moving   as he or she usually does. Date: ____________ Movements: ____________ Start time: ____________ Finish time: ____________  Date: ____________ Movements: ____________ Start time: ____________ Finish time: ____________ Date: ____________ Movements: ____________ Start time: ____________ Finish time: ____________ Date: ____________ Movements: ____________ Start time: ____________ Finish time: ____________ Date: ____________ Movements: ____________ Start time: ____________ Finish time: ____________ Date: ____________ Movements: ____________ Start time: ____________ Finish time: ____________ Date: ____________ Movements: ____________ Start time: ____________ Finish time: ____________ Date: ____________ Movements: ____________ Start time: ____________ Finish time: ____________  Date: ____________ Movements: ____________ Start time:  ____________ Finish time: ____________ Date: ____________ Movements: ____________ Start time: ____________ Finish time: ____________ Date: ____________ Movements: ____________ Start time: ____________ Finish time: ____________ Date: ____________ Movements: ____________ Start time: ____________ Finish time: ____________ Date: ____________ Movements: ____________ Start time: ____________ Finish time: ____________ Date: ____________ Movements: ____________ Start time: ____________ Finish time: ____________ Date: ____________ Movements: ____________ Start time: ____________ Finish time: ____________  Date: ____________ Movements: ____________ Start time: ____________ Finish time: ____________ Date: ____________ Movements: ____________ Start time: ____________ Finish time: ____________ Date: ____________ Movements: ____________ Start time: ____________ Finish time: ____________ Date: ____________ Movements: ____________ Start time: ____________ Finish time: ____________ Date: ____________ Movements: ____________ Start time: ____________ Finish time: ____________ Date: ____________ Movements: ____________ Start time: ____________ Finish time: ____________ Date: ____________ Movements: ____________ Start time: ____________ Finish time: ____________  Date: ____________ Movements: ____________ Start time: ____________ Finish time: ____________ Date: ____________ Movements: ____________ Start time: ____________ Finish time: ____________ Date: ____________ Movements: ____________ Start time: ____________ Finish time: ____________ Date: ____________ Movements: ____________ Start time: ____________ Finish time: ____________ Date: ____________ Movements: ____________ Start time: ____________ Finish time: ____________ Date: ____________ Movements: ____________ Start time: ____________ Finish time: ____________ Date: ____________ Movements: ____________ Start time: ____________ Finish time: ____________  Date:  ____________ Movements: ____________ Start time: ____________ Finish time: ____________ Date: ____________ Movements: ____________ Start time: ____________ Finish time: ____________ Date: ____________ Movements: ____________ Start time: ____________ Finish time: ____________ Date: ____________ Movements: ____________ Start time: ____________ Finish time: ____________ Date: ____________ Movements: ____________ Start time: ____________ Finish time: ____________ Date: ____________ Movements: ____________ Start time: ____________ Finish time: ____________ Date: ____________ Movements: ____________ Start time: ____________ Finish time: ____________  Date: ____________ Movements: ____________ Start time: ____________ Finish time: ____________ Date: ____________ Movements: ____________ Start time: ____________ Finish time: ____________ Date: ____________ Movements: ____________ Start time: ____________ Finish time: ____________ Date: ____________ Movements: ____________ Start time: ____________ Finish time: ____________ Date: ____________ Movements: ____________ Start time: ____________ Finish time: ____________ Date: ____________ Movements: ____________ Start time: ____________ Finish time: ____________ Date: ____________ Movements: ____________ Start time: ____________ Finish time: ____________  Date: ____________ Movements: ____________ Start time: ____________ Finish time: ____________ Date: ____________ Movements: ____________ Start time: ____________ Finish time: ____________ Date: ____________ Movements: ____________ Start time: ____________ Finish time: ____________ Date: ____________ Movements: ____________ Start time: ____________ Finish time: ____________ Date: ____________ Movements: ____________ Start time: ____________ Finish time: ____________ Date: ____________ Movements: ____________ Start time: ____________ Finish time: ____________ Date: ____________ Movements: ____________ Start  time: ____________ Finish time: ____________  Date: ____________ Movements: ____________ Start time: ____________ Finish time: ____________ Date: ____________ Movements: ____________ Start time: ____________ Finish time: ____________ Date: ____________ Movements: ____________ Start time: ____________ Finish time: ____________ Date: ____________ Movements: ____________ Start time: ____________ Finish time: ____________ Date: ____________ Movements: ____________ Start time: ____________ Finish time: ____________ Date: ____________ Movements: ____________ Start time: ____________ Finish time: ____________ Document Released: 05/13/2006 Document Revised: 08/28/2013 Document Reviewed: 02/08/2012 [employer] Patient Information 2015 [employer]. This information is not intended to replace advice   given to you by your health care provider. Make sure you discuss any questions you have with your health care provider.

## 2014-02-06 NOTE — MAU Note (Signed)
Contractions this am but able to nap. When awoke contractions stronger. Was 4cm last exam. Denies bleeding or LOF

## 2014-02-11 ENCOUNTER — Inpatient Hospital Stay (HOSPITAL_COMMUNITY): Payer: Medicaid Other | Admitting: Anesthesiology

## 2014-02-11 ENCOUNTER — Encounter (HOSPITAL_COMMUNITY): Payer: Medicaid Other | Admitting: Anesthesiology

## 2014-02-11 ENCOUNTER — Encounter (HOSPITAL_COMMUNITY): Payer: Self-pay

## 2014-02-11 ENCOUNTER — Inpatient Hospital Stay (HOSPITAL_COMMUNITY)
Admission: AD | Admit: 2014-02-11 | Discharge: 2014-02-13 | DRG: 775 | Disposition: A | Discharge status: Home / Self Care | Payer: Medicaid Other | Source: Ambulatory Visit | Attending: Obstetrics and Gynecology | Admitting: Obstetrics and Gynecology

## 2014-02-11 DIAGNOSIS — Z87440 Personal history of urinary (tract) infections: Secondary | ICD-10-CM

## 2014-02-11 DIAGNOSIS — O471 False labor at or after 37 completed weeks of gestation: Secondary | ICD-10-CM | POA: Diagnosis present

## 2014-02-11 DIAGNOSIS — Z3A37 37 weeks gestation of pregnancy: Secondary | ICD-10-CM | POA: Diagnosis present

## 2014-02-11 LAB — CBC
HEMATOCRIT: 35.6 % — AB (ref 36.0–46.0)
Hemoglobin: 12.4 g/dL (ref 12.0–15.0)
MCH: 32 pg (ref 26.0–34.0)
MCHC: 34.8 g/dL (ref 30.0–36.0)
MCV: 92 fL (ref 78.0–100.0)
PLATELETS: 129 10*3/uL — AB (ref 150–400)
RBC: 3.87 MIL/uL (ref 3.87–5.11)
RDW: 13.7 % (ref 11.5–15.5)
WBC: 9.7 10*3/uL (ref 4.0–10.5)

## 2014-02-11 MED ORDER — LIDOCAINE HCL (PF) 1 % IJ SOLN
INTRAMUSCULAR | Status: DC | PRN
Start: 2014-02-11 — End: 2014-02-12
  Administered 2014-02-11: 3 mL
  Administered 2014-02-11 (×2): 5 mL

## 2014-02-11 MED ORDER — LIDOCAINE HCL (PF) 1 % IJ SOLN
30.0000 mL | INTRAMUSCULAR | Status: DC | PRN
  Filled 2014-02-11: qty 30

## 2014-02-11 MED ORDER — OXYTOCIN BOLUS FROM INFUSION
500.0000 mL | INTRAVENOUS | Status: DC
  Administered 2014-02-11: 500 mL via INTRAVENOUS

## 2014-02-11 MED ORDER — LACTATED RINGERS IV SOLN: 500.0000 mL | INTRAVENOUS | Status: DC | PRN

## 2014-02-11 MED ORDER — OXYTOCIN 40 UNITS IN LACTATED RINGERS INFUSION - SIMPLE MED
62.5000 mL/h | INTRAVENOUS | Status: DC
  Administered 2014-02-11: 62.5 mL/h via INTRAVENOUS

## 2014-02-11 MED ORDER — OXYCODONE-ACETAMINOPHEN 5-325 MG PO TABS: 2.0000 | ORAL_TABLET | ORAL | Status: DC | PRN

## 2014-02-11 MED ORDER — FLEET ENEMA 7-19 GM/118ML RE ENEM: 1.0000 | ENEMA | RECTAL | Status: DC | PRN

## 2014-02-11 MED ORDER — CITRIC ACID-SODIUM CITRATE 334-500 MG/5ML PO SOLN: 30.0000 mL | ORAL | Status: DC | PRN

## 2014-02-11 MED ORDER — OXYCODONE-ACETAMINOPHEN 5-325 MG PO TABS: 1.0000 | ORAL_TABLET | ORAL | Status: DC | PRN

## 2014-02-11 MED ORDER — LIDOCAINE HCL (PF) 1 % IJ SOLN
INTRAMUSCULAR | Status: AC
  Filled 2014-02-11: qty 30

## 2014-02-11 MED ORDER — FENTANYL 2.5 MCG/ML BUPIVACAINE 1/10 % EPIDURAL INFUSION (WH - ANES)
INTRAMUSCULAR | Status: DC | PRN
  Administered 2014-02-11: 14 mL/h via EPIDURAL

## 2014-02-11 MED ORDER — PHENYLEPHRINE 40 MCG/ML (10ML) SYRINGE FOR IV PUSH (FOR BLOOD PRESSURE SUPPORT)
80.0000 ug | PREFILLED_SYRINGE | INTRAVENOUS | Status: DC | PRN
  Filled 2014-02-11: qty 10
  Filled 2014-02-11: qty 2

## 2014-02-11 MED ORDER — EPHEDRINE 5 MG/ML INJ
10.0000 mg | INTRAVENOUS | Status: DC | PRN
  Filled 2014-02-11: qty 2

## 2014-02-11 MED ORDER — LACTATED RINGERS IV SOLN: INTRAVENOUS | Status: DC

## 2014-02-11 MED ORDER — OXYTOCIN 40 UNITS IN LACTATED RINGERS INFUSION - SIMPLE MED
INTRAVENOUS | Status: AC
  Filled 2014-02-11: qty 1000

## 2014-02-11 MED ORDER — LACTATED RINGERS IV SOLN: 500.0000 mL | Freq: Once | INTRAVENOUS | Status: DC

## 2014-02-11 MED ORDER — PHENYLEPHRINE 40 MCG/ML (10ML) SYRINGE FOR IV PUSH (FOR BLOOD PRESSURE SUPPORT)
80.0000 ug | PREFILLED_SYRINGE | INTRAVENOUS | Status: DC | PRN
  Filled 2014-02-11: qty 2

## 2014-02-11 MED ORDER — ACETAMINOPHEN 325 MG PO TABS: 650.0000 mg | ORAL_TABLET | ORAL | Status: DC | PRN

## 2014-02-11 MED ORDER — DIPHENHYDRAMINE HCL 50 MG/ML IJ SOLN: 12.5000 mg | INTRAMUSCULAR | Status: DC | PRN

## 2014-02-11 MED ORDER — ONDANSETRON HCL 4 MG/2ML IJ SOLN: 4.0000 mg | Freq: Four times a day (QID) | INTRAMUSCULAR | Status: DC | PRN

## 2014-02-11 MED ORDER — BUTORPHANOL TARTRATE 1 MG/ML IJ SOLN
1.0000 mg | Freq: Once | INTRAMUSCULAR | Status: AC
  Administered 2014-02-11: 1 mg via INTRAVENOUS
  Filled 2014-02-11: qty 1

## 2014-02-11 MED ORDER — FENTANYL 2.5 MCG/ML BUPIVACAINE 1/10 % EPIDURAL INFUSION (WH - ANES)
14.0000 mL/h | INTRAMUSCULAR | Status: DC | PRN
  Filled 2014-02-11: qty 125

## 2014-02-11 NOTE — Progress Notes (Addendum)
Patient ID: Tanya Bailey, female   DOB: 01/04/1986, 28 y.o.   MRN: [id number] Delivery note:  The pt received an epidural and as soon as it was effective she was fully dilated. The FHR dropped so the pt was asked to push and with 1 set of pushes she delivered a living female infant spontaneously LOA over an intact perineum.The placenta delivered intact and the uterus was normal. There were no lacerations EBL 400 cc's.  Apgars were 8 and 9 at 1 and 5 minutes.

## 2014-02-11 NOTE — Progress Notes (Signed)
Notified of pt arrival in MAU, cervical exam and FHR tracing with variable. Dr to review strip and call back

## 2014-02-11 NOTE — Progress Notes (Signed)
Patient ID: Tanya Bailey J Nation, female   DOB: [birth year], 28 y.o.   MRN: [id number] I had checked on the pt one last time and she was doing well and the baby was at the breast. I walked to the doctor's room , got a cracker and took of my shoes and pants and heard over the intercom that there was a medical emergency code Apgar in room 164. I put my shoes on without tying them and ran to the room and the NICU team was there. The baby was responding. The baby was attended by the NICU team in no more than 1 minute from the time the code Apgar was called. Dr. Ralene Batheaftery feels the baby probably choked on some mucus

## 2014-02-11 NOTE — MAU Note (Signed)
Pt presents complaining of contractions and vaginal bleeding that got worse around 1700. States she has increased bloody show. Denies leaking of fluid and reports good fetal movement.

## 2014-02-11 NOTE — MAU Note (Signed)
[facility] Strip reviewed. RN to transport

## 2014-02-11 NOTE — H&P (Signed)
NAMClaudia Bailey:  Nova, Cola             ACCOUNT NO.:  [id number]  MEDICAL RECORD NO.:  [id number]  LOCATION:  [id number]                          FACILITY:  [facility]  PHYSICIAN:  Malachi Prohomas F. Ambrose MantleHenley, M.D. DATE OF BIRTH:  [birth year]  DATE OF ADMISSION:  02/11/2014 DATE OF DISCHARGE:                             HISTORY & PHYSICAL   HISTORY OF PRESENT ILLNESS:  This is a 28 year old black female, para 1- 2-2-3, gravida 6, who was admitted to the hospital at 37 weeks and 1 day with contractions and bloody show and some decelerations with contractions.  Blood group and type O positive, negative antibody, RPR negative.  Urine culture positive with E. coli, treated with Macrobid. Hepatitis B surface antigen negative, HIV negative, GC and Chlamydia negative, varicella immune, rubella immune, hemoglobin AA, Pap smear normal, first trimester screen negative, AFP negative, 1-hour Glucola 104, group B strep negative, repeat HIV and RPR negative.  This patient was seen for a new OB workup on August 08, 2013.  At that time, she had 2 ultrasound exams that placed her at 10 weeks and 3 days.  She was placed on Macrobid for the positive E. coli UTI.  She was counseled about premature deliveries and she wanted to start her progesterone injections.  She began the progesterone at 17 weeks and 4 days because it was delayed getting it to our office.  She stated that it made her dizzy, made the room spin.  She became nauseated.  Began at 19 weeks, she complained of dizziness and headache.  I offered her a progesterone vaginally.  She decided to stay with a shot.  At 21 weeks, she no showed for a shot, but then received it later.  She began to no-show frequently at about 24 weeks and at 27 weeks, stated that she was not going to take anymore the progesterone shots.  The patient was actually admitted to the hospital within the last 2 weeks because Maternity Admission thought she was 5 to 6 cm dilated.  I evaluated the patient  for 12 hours and she was not in labor, so I discharged her.  When I examined her in the office on February 08, 2014, her cervix was 4 cm, 50%, vertex at a -2.  I had felt it was 2 cm on January 29, 2014.  She claimed to be having contractions consistently of less than 5 minutes apart.  She came to the hospital the night, claiming contractions that were verified by the monitor at 2 to 4-1/2 minutes apart, but unfortunately, she was having decelerations with some of the contractions.  She was positioned and they improved, but [facility], he felt that we justified in going ahead and stimulating her labor if she does not go into spontaneous labor on her own.  PAST MEDICAL HISTORY:  She has had frequent UTIs when she has been pregnant.  PAST SURGICAL HISTORY:  She has had teeth extracted, but no other surgeries.  ALLERGIES:  No known allergies and no latex allergy.  SOCIAL HISTORY:  She does not smoke.  Before pregnancy, occasionally drank alcohol.  Denies illicit drugs.  Twelfth grade education.  Works  as a hair stylist.  OBSTETRIC HISTORY:  In 2005, she had a 5-pound 3-ounce baby at full term.  In 2006, she delivered a 6-ounce baby.  She thought it was 25 weeks, fetal demise.  In 2008, she had a 5-pound 14-ounce baby at 35 weeks.  In 2009, 6-pound 12-ounce baby at 36 weeks.  She had 1 first trimester abortion or miscarriage.  FAMILY HISTORY:  Maternal aunt and paternal aunt had cancer of the breast.  PHYSICAL EXAMINATION:  VITAL SIGNS:  Temperature is 98.1, pulse 85, respirations 22, blood pressure 114/66. CARDIOVASCULAR:  Heart normal size and sounds.  No murmurs. LUNGS:  Clear to auscultation.  ASSESSMENT:  At her last prenatal visit on February 08, 2014, her fundal height was 37.5 cm.  Fetal heart tones were normal.  Tonight, on admission, she was having deceleration with fetal heart rate maybe 20 beats per minute, changed from  baseline.  She was positioned and they are improved, but then not gone away and as I stated early, I have consulted with Dr. Harlon FlorWhitaker, he feels like it is appropriate to go ahead and admit her and deliver the baby.  ADMITTING IMPRESSION:  Intrauterine pregnancy at 37 weeks 1 day, early labor.  History of preterm deliveries.  Variable deceleration to the fetal heart rate.  The patient was admitted for delivery.     Malachi Prohomas F. Ambrose MantleHenley, M.D.     TFH/MEDQ  D:  02/11/2014  T:  02/11/2014  Job:  [id number]

## 2014-02-11 NOTE — Anesthesia Procedure Notes (Signed)
Epidural Patient location during procedure: OB  Staffing Anesthesiologist: Cyanne Delmar Performed by: anesthesiologist   Preanesthetic Checklist Completed: patient identified, site marked, surgical consent, pre-op evaluation, timeout performed, IV checked, risks and benefits discussed and monitors and equipment checked  Epidural Patient position: sitting Prep: ChloraPrep Patient monitoring: heart rate, continuous pulse ox and blood pressure Approach: right paramedian Location: L3-L4 Injection technique: LOR saline  Needle:  Needle type: Tuohy  Needle gauge: 17 G Needle length: 9 cm and 9 Needle insertion depth: 6 cm Catheter type: closed end flexible Catheter size: 20 Guage Catheter at skin depth: 11 cm Test dose: negative  Assessment Events: blood not aspirated, injection not painful, no injection resistance, negative IV test and no paresthesia  Additional Notes   Patient tolerated the insertion well without complications.   

## 2014-02-11 NOTE — Progress Notes (Signed)
Patient ID: Lucie Leatheriffany J Hughlett, female   DOB: January 05, 1986, 28 y.o.   MRN: [id number] I was called at 8 PM and was advised the pt was here with contractions and bloody show and that she was having some variable decelerations. I confirmed the decelerations on OBIX. They were variable and went down 20 BPM below baseline. I called Dr. Aldine ContesWhitacre and asked if she did not go into labor if it would be appropriate to augment the labor because of the decelerations that had improved with positioning. He felt it was justified to get the baby delivered. I came to the hospital and found that the patient was still in MAU . I was informed that the nurses were in safety rounds so I asked the ward clerk to call the charge nurse and ask if my patient could be brought toL&D The message was relayed to me that a room would be assigned when safety rounds were over. I continued to wait at the nurses station until 9:20 PM when a room was assigned and the pt arrived in the room at 9:30 PM At 9:40 PM I examined her and found the cervix 5 cm 100% effaced and the vertex at - 2 station. AROM produced slightly bloody fluid. The pt requested an epidural and when the CBC was available the epidural was requested.

## 2014-02-11 NOTE — Anesthesia Preprocedure Evaluation (Signed)

## 2014-02-11 NOTE — Progress Notes (Signed)
Provider reviewed strip. Will observe and recheck in hour or when pt becomes more uncomfortable.

## 2014-02-12 ENCOUNTER — Encounter (HOSPITAL_COMMUNITY): Payer: Self-pay | Admitting: *Deleted

## 2014-02-12 LAB — CBC
HCT: 33.1 % — ABNORMAL LOW (ref 36.0–46.0)
Hemoglobin: 11.7 g/dL — ABNORMAL LOW (ref 12.0–15.0)
MCH: 32.3 pg (ref 26.0–34.0)
MCHC: 35.3 g/dL (ref 30.0–36.0)
MCV: 91.4 fL (ref 78.0–100.0)
Platelets: 123 10*3/uL — ABNORMAL LOW (ref 150–400)
RBC: 3.62 MIL/uL — ABNORMAL LOW (ref 3.87–5.11)
RDW: 13.8 % (ref 11.5–15.5)
WBC: 11 10*3/uL — ABNORMAL HIGH (ref 4.0–10.5)

## 2014-02-12 LAB — RPR

## 2014-02-12 MED ORDER — DIBUCAINE 1 % RE OINT: 1.0000 "application " | TOPICAL_OINTMENT | RECTAL | Status: DC | PRN

## 2014-02-12 MED ORDER — BENZOCAINE-MENTHOL 20-0.5 % EX AERO: 1.0000 "application " | INHALATION_SPRAY | CUTANEOUS | Status: DC | PRN

## 2014-02-12 MED ORDER — DIPHENHYDRAMINE HCL 25 MG PO CAPS: 25.0000 mg | ORAL_CAPSULE | Freq: Four times a day (QID) | ORAL | Status: DC | PRN

## 2014-02-12 MED ORDER — LANOLIN HYDROUS EX OINT: TOPICAL_OINTMENT | CUTANEOUS | Status: DC | PRN

## 2014-02-12 MED ORDER — MEASLES, MUMPS & RUBELLA VAC ~~LOC~~ INJ: 0.5000 mL | INJECTION | Freq: Once | SUBCUTANEOUS | Status: DC

## 2014-02-12 MED ORDER — SENNOSIDES-DOCUSATE SODIUM 8.6-50 MG PO TABS
2.0000 | ORAL_TABLET | ORAL | Status: DC
  Administered 2014-02-13: 2 via ORAL
  Filled 2014-02-12: qty 2

## 2014-02-12 MED ORDER — PRENATAL MULTIVITAMIN CH: 1.0000 | ORAL_TABLET | Freq: Every day | ORAL | Status: DC

## 2014-02-12 MED ORDER — ZOLPIDEM TARTRATE 5 MG PO TABS: 5.0000 mg | ORAL_TABLET | Freq: Every evening | ORAL | Status: DC | PRN

## 2014-02-12 MED ORDER — LACTATED RINGERS IV SOLN: INTRAVENOUS | Status: AC

## 2014-02-12 MED ORDER — ONDANSETRON HCL 4 MG PO TABS
4.0000 mg | ORAL_TABLET | ORAL | Status: DC | PRN
Start: 2014-02-12 — End: 2014-02-13

## 2014-02-12 MED ORDER — OXYTOCIN 40 UNITS IN LACTATED RINGERS INFUSION - SIMPLE MED: 62.5000 mL/h | INTRAVENOUS | Status: AC | PRN

## 2014-02-12 MED ORDER — SIMETHICONE 80 MG PO CHEW: 80.0000 mg | CHEWABLE_TABLET | ORAL | Status: DC | PRN

## 2014-02-12 MED ORDER — TETANUS-DIPHTH-ACELL PERTUSSIS 5-2.5-18.5 LF-MCG/0.5 IM SUSP: 0.5000 mL | Freq: Once | INTRAMUSCULAR | Status: DC

## 2014-02-12 MED ORDER — OXYCODONE-ACETAMINOPHEN 5-325 MG PO TABS: 1.0000 | ORAL_TABLET | ORAL | Status: DC | PRN

## 2014-02-12 MED ORDER — OXYCODONE-ACETAMINOPHEN 5-325 MG PO TABS: 2.0000 | ORAL_TABLET | ORAL | Status: DC | PRN

## 2014-02-12 MED ORDER — PRENATAL MULTIVITAMIN CH
1.0000 | ORAL_TABLET | Freq: Every day | ORAL | Status: DC
  Administered 2014-02-12: 1 via ORAL
  Filled 2014-02-12: qty 1

## 2014-02-12 MED ORDER — ONDANSETRON HCL 4 MG/2ML IJ SOLN: 4.0000 mg | INTRAMUSCULAR | Status: DC | PRN

## 2014-02-12 MED ORDER — IBUPROFEN 600 MG PO TABS
600.0000 mg | ORAL_TABLET | Freq: Four times a day (QID) | ORAL | Status: DC
  Administered 2014-02-12 – 2014-02-13 (×5): 600 mg via ORAL
  Filled 2014-02-12 (×6): qty 1

## 2014-02-12 MED ORDER — WITCH HAZEL-GLYCERIN EX PADS: 1.0000 "application " | MEDICATED_PAD | CUTANEOUS | Status: DC | PRN

## 2014-02-12 NOTE — Lactation Note (Signed)
This note was copied from the chart of Tanya Claudia Pollockiffany Debord. Lactation Consultation Note Experienced BF 1st and 2nd child 6 months each, no challenges. 3rd baby BF 15 months d/t milk allergy and couldn't take formula, pumped and bottled after 6 months until 815 months old. Mom states her breast are the biggest they have ever been. States baby has latched on well. Did have code apgar spell BF after delivery. Encouraged to call for changes of baby while BF. Been in observation in nursery. Mom encouraged to feed baby 8-12 times/24 hours and with feeding cues. Mom encouraged to do skin-to-skin.Reviewed Baby & Me book's Breastfeeding Basics. [facility] brochure given w/resources, support groups and LC services. Encouraged to call for any assistance as needed. Patient Name: Tanya Bailey ZOXWR'UToday's Date: 02/12/2014 Reason for consult: Initial assessment   Maternal Data Has patient been taught Hand Expression?: Yes Does the patient have breastfeeding experience prior to this delivery?: Yes  Feeding Feeding Type: Breast Fed Length of feed:  (attempted, no latch)  LATCH Score/Interventions                      Lactation Tools Discussed/Used     Consult Status Consult Status: Follow-up Date: 02/12/14 Follow-up type: In-patient    Oneal Schoenberger, Diamond NickelLAURA G 02/12/2014, 7:01 AM

## 2014-02-12 NOTE — Anesthesia Postprocedure Evaluation (Signed)
  Anesthesia Post-op Note  Patient: Tanya Bailey  Procedure(s) Performed: * No procedures listed *  Patient Location: Mother/Baby  Anesthesia Type:Epidural  Level of Consciousness: awake, alert , oriented and patient cooperative  Airway and Oxygen Therapy: Patient Spontanous Breathing  Post-op Pain: mild  Post-op Assessment: Post-op Vital signs reviewed, Patient's Cardiovascular Status Stable, Respiratory Function Stable, Patent Airway, No headache, No backache, No residual numbness and No residual motor weakness  Post-op Vital Signs: Reviewed and stable  Last Vitals:  Filed Vitals:   02/12/14 0600  BP: 119/62  Pulse: 69  Temp: 37.1 C  Resp: 18    Complications: No apparent anesthesia complications

## 2014-02-12 NOTE — Progress Notes (Signed)
Patient ID: Tanya Bailey, female   DOB: November 10, 1985, 28 y.o.   MRN: [id number] #1 afebrile BP normal HGB 12.4 to 11.7 Baby had a nother episode so is in the nursery but is doing well.

## 2014-02-12 NOTE — Progress Notes (Signed)
UR chart review completed.  

## 2014-02-13 MED ORDER — OXYCODONE-ACETAMINOPHEN 5-325 MG PO TABS: 1.0000 | ORAL_TABLET | Freq: Four times a day (QID) | ORAL | Status: DC | PRN

## 2014-02-13 MED ORDER — IBUPROFEN 600 MG PO TABS: 600.0000 mg | ORAL_TABLET | Freq: Four times a day (QID) | ORAL | Status: DC | PRN

## 2014-02-13 NOTE — Discharge Summary (Signed)
NAMClaudia Bailey:  Corkum, Kyren             ACCOUNT NO.:  [id number]  MEDICAL RECORD NO.:  [id number]  LOCATION:  9122                          FACILITY:  [facility]  PHYSICIAN:  Malachi Prohomas F. Ambrose MantleHenley, M.D. DATE OF BIRTH:  06-23-1985  DATE OF ADMISSION:  02/11/2014 DATE OF DISCHARGE:                              DISCHARGE SUMMARY   This is a 28 year old black female, para 1-2-2-3, gravida 6, who is admitted to the hospital at 37 weeks and 1 day with contractions and bloody show and some decelerations with contractions.  Prenatal course is documented in her history and physical.  She had been admitted to the hospital through maternity admission within the last 2 weeks.  The admitting nurse thought she was in labor.  She was not in labor, so she was discharged.  She has been at home with the cervix 4 cm dilated.  She came to the hospital.  Did have some decelerations.  [facility] and she did not go into labor on her own without stimulator and labor because of the decelerations.  He felt it was very except to go ahead and get the baby delivered.  I came to the hospital immediately after I was called and the patient was not in Labor and Delivery.  I had been called at 8 o'clock.  She arrived in her room at 9:30 p.m.  She received an epidural because her labor did increase on her own.  Artificial rupture of the membranes was done for the slightly bloody fluid.  She became fully dilated, pushed well and delivered a living female infant 6 pounds 12 ounces.  Apgars were 8 and nine at 1 and five minutes.  There were no lacerations.  After I finished my delivery note, I checked on the patient one last time.  She was doing well and the baby was at the breast.  I walked to the doctors room, got a cracker and took off my shoes and heard over the intercom that there was a medical emergency in her room.  I put my shoes on without tying and ran to the room and the NICU team was  there.  The baby was responding.  The baby was attended by the NICU team and no more than 1 minute from the time, the code Apgar was called.  Dr. Rayna Sextonalph felt the baby probably choked on some mucus.  Subsequently, the baby and the patient did well and on the second postpartum day, the patient was discharged.  Initial hemoglobin 12.4, hematocrit 35.6, white count 9700, RPR nonreactive.  Followup hemoglobin 11.7.  FINAL DIAGNOSES:  Intrauterine pregnancy at 37 weeks and 1 day, delivered LOA.  OPERATION:  Spontaneous delivery LOA.  No lacerations.  FINAL CONDITION:  Improved.  INSTRUCTIONS:  Include our regular discharge instruction booklet as well as the after visit summary.  Prescriptions for Percocet 5/325, 30 tablets, 1 every 6 hours as needed for pain and Motrin 600 mg 30 tablets, 1 every 6 hours as needed for pain.  The patient is to continue her prenatal vitamins and she can continue her calcium and vitamin D as needed.  She is to return in  6 weeks for followup examination.    Malachi Prohomas F. Ambrose MantleHenley, M.D.    TFH/MEDQ  D:  02/13/2014  T:  02/13/2014  Job:  [id number]

## 2014-02-13 NOTE — Lactation Note (Signed)
This note was copied from the chart of Girl Claudia Pollockiffany Stober. Lactation Consultation Note  Patient Name: Girl Claudia Pollockiffany Gipe ZOXWR'UToday's Date: 02/13/2014 Reason for consult: Follow-up assessment Mom reports some mild nipple tenderness PS=2. Mom reports that baby is latching well and discomfort is improving. No breakdown reported, Mom applying EBM for comfort, care for sore nipples reviewed. Mom ready to go home and reports she did not need assist with latch. Mom is experienced BF, some basic teaching reviewed. Engorgement care reviewed if needed. Advised of OP services and support group. LC notes no stools charted in baby's life, Mom reports baby has not stooled. LC advised Wallie RenshawNatalie Branch, RN and she is called Dr. Hyacinth MeekerMiller, Peds to confirm order for d/c home. Mom denies other questions or concerns.   Maternal Data    Feeding Feeding Type: Breast Fed Length of feed: 30 min  LATCH Score/Interventions                      Lactation Tools Discussed/Used     Consult Status Consult Status: Complete Date: 02/13/14 Follow-up type: In-patient    Alfred LevinsGranger, Leibish Mcgregor Ann 02/13/2014, 10:41 AM

## 2014-02-13 NOTE — Progress Notes (Signed)
Patient ID: Tanya Bailey, female   DOB: Nov 26, 1985, 28 y.o.   MRN: [id number] #2 afebrile BP normal Baby doing well For D/C

## 2014-02-13 NOTE — Discharge Instructions (Signed)
booklet °

## 2014-02-13 NOTE — Plan of Care (Signed)
Problem: Discharge Progression Outcomes Goal: Afebrile, VS remain stable at discharge Outcome: Completed/Met Date Met:  02/13/14 Patient is progressing great in her care. Will be going home today. Discharge teaching done

## 2014-02-26 ENCOUNTER — Encounter (HOSPITAL_COMMUNITY): Payer: Self-pay | Admitting: *Deleted

## 2014-07-10 ENCOUNTER — Encounter (HOSPITAL_COMMUNITY): Payer: Self-pay | Admitting: Emergency Medicine

## 2014-07-10 ENCOUNTER — Emergency Department (HOSPITAL_COMMUNITY)
Admission: EM | Admit: 2014-07-10 | Discharge: 2014-07-10 | Disposition: A | Discharge status: Home / Self Care | Payer: Medicaid Other | Attending: Emergency Medicine | Admitting: Emergency Medicine

## 2014-07-10 ENCOUNTER — Emergency Department (HOSPITAL_COMMUNITY): Payer: Medicaid Other

## 2014-07-10 DIAGNOSIS — R011 Cardiac murmur, unspecified: Secondary | ICD-10-CM | POA: Insufficient documentation

## 2014-07-10 DIAGNOSIS — Z79899 Other long term (current) drug therapy: Secondary | ICD-10-CM | POA: Insufficient documentation

## 2014-07-10 DIAGNOSIS — S92351A Displaced fracture of fifth metatarsal bone, right foot, initial encounter for closed fracture: Secondary | ICD-10-CM | POA: Insufficient documentation

## 2014-07-10 DIAGNOSIS — Y998 Other external cause status: Secondary | ICD-10-CM | POA: Insufficient documentation

## 2014-07-10 DIAGNOSIS — Y9301 Activity, walking, marching and hiking: Secondary | ICD-10-CM | POA: Insufficient documentation

## 2014-07-10 DIAGNOSIS — S92901A Unspecified fracture of right foot, initial encounter for closed fracture: Secondary | ICD-10-CM

## 2014-07-10 DIAGNOSIS — Y9289 Other specified places as the place of occurrence of the external cause: Secondary | ICD-10-CM | POA: Insufficient documentation

## 2014-07-10 DIAGNOSIS — W108XXA Fall (on) (from) other stairs and steps, initial encounter: Secondary | ICD-10-CM | POA: Insufficient documentation

## 2014-07-10 DIAGNOSIS — Z8619 Personal history of other infectious and parasitic diseases: Secondary | ICD-10-CM | POA: Insufficient documentation

## 2014-07-10 MED ORDER — OXYCODONE-ACETAMINOPHEN 5-325 MG PO TABS: 1.0000 | ORAL_TABLET | Freq: Four times a day (QID) | ORAL | Status: DC | PRN

## 2014-07-10 NOTE — ED Provider Notes (Signed)
CSN: [id number]     Arrival date & time 07/10/14  0945 History   First MD Initiated Contact with Patient 07/10/14 1023     Chief Complaint  Patient presents with  . Fall  . Foot Pain    10 days post fall     (Consider location/radiation/quality/duration/timing/severity/associated sxs/prior Treatment) HPI Tanya Bailey is a 29 y.o. female who comes in for evaluation of right foot pain. Patient states on March 4 she was walking down her steps in high heels when she missed the last 2 steps and fell. She has been taking ibuprofen 800 mg with some relief of her symptoms. She reports taking ibuprofen prior to arrival and rates her discomfort as a 4/10. She reports being able to bear weight, however with some discomfort. Ibuprofen improves discomfort and weightbearing exacerbates her symptoms. She has not tried anything else to improve her symptoms. Denies fevers, chills, numbness or weakness, changes in skin temperature.  Past Medical History  Diagnosis Date  . Heart murmur     childhood  . Abnormal Pap smear   . Chlamydia   . Preterm labor    Past Surgical History  Procedure Laterality Date  . Tooth extraction     Family History  Problem Relation Age of Onset  . Anesthesia problems Neg Hx   . Alcohol abuse Neg Hx   . Arthritis Neg Hx   . Asthma Neg Hx   . Birth defects Neg Hx   . Cancer Neg Hx   . COPD Neg Hx   . Depression Neg Hx   . Diabetes Neg Hx   . Drug abuse Neg Hx   . Early death Neg Hx   . Hearing loss Neg Hx   . Heart disease Neg Hx   . Hyperlipidemia Neg Hx   . Hypertension Neg Hx   . Kidney disease Neg Hx   . Learning disabilities Neg Hx   . Mental illness Neg Hx   . Mental retardation Neg Hx   . Miscarriages / Stillbirths Neg Hx   . Stroke Neg Hx   . Vision loss Neg Hx   . Other Neg Hx    History  Substance Use Topics  . Smoking status: Never Smoker   . Smokeless tobacco: Never Used  . Alcohol Use: Yes     Comment: Special occas. when not pregnant    OB History    Gravida Para Term Preterm AB TAB SAB Ectopic Multiple Living   6 5 2 3 1  0 0 1 0 4     Review of Systems  Constitutional: Negative for fever.  Respiratory: Negative for shortness of breath.   Cardiovascular: Negative for chest pain.  Musculoskeletal: Positive for myalgias.       Right foot pain  Skin: Negative for rash.  All other systems reviewed and are negative.     Allergies  Review of patient's allergies indicates no known allergies.  Home Medications   Prior to Admission medications   Medication Sig Start Date End Date Taking? Authorizing Provider  ibuprofen (ADVIL,MOTRIN) 600 MG tablet Take 1 tablet (600 mg total) by mouth every 6 (six) hours as needed. 02/13/14   Tracey Harrieshomas Henley, MD  oxyCODONE-acetaminophen (PERCOCET/ROXICET) 5-325 MG per tablet Take 1 tablet by mouth every 6 (six) hours as needed (for pain scale less than 7). 07/10/14   Joycie PeekBenjamin Tiajah Oyster, PA-C  Prenatal Vit-Fe Fumarate-FA (PRENATAL MULTIVITAMIN) TABS tablet Take 1 tablet by mouth daily.     Historical Provider, MD  BP 122/76 mmHg  Pulse 67  Temp(Src) 98 F (36.7 C) (Oral)  Resp 16  SpO2 100%  LMP 06/11/2014 (Approximate)  Breastfeeding? Yes Physical Exam  Constitutional:  Awake, alert, nontoxic appearance.  HENT:  Head: Atraumatic.  Eyes: Right eye exhibits no discharge. Left eye exhibits no discharge.  Neck: Neck supple.  Pulmonary/Chest: Effort normal. She exhibits no tenderness.  Abdominal: Soft. There is no tenderness. There is no rebound.  Musculoskeletal: She exhibits no tenderness.  Baseline ROM, no obvious new focal weakness. Tenderness diffusely over third fourth and fifth metatarsals with no obvious deformities or lesions noted. Patient maintains full active range of motion of right ankle and toes. Motor and sensation are 5/5. Distal pulses intact with brisk cap refill. No tenderness to posterior aspect of lateral or medial malleolus. No tenderness with palpation to mid  tibia fibula.  Neurological:  Mental status and motor strength appears baseline for patient and situation.  Skin: No rash noted.  Psychiatric: She has a normal mood and affect.  Nursing note and vitals reviewed.   ED Course  Procedures (including critical care time) SPLINT APPLICATION Date/Time: 11:41 AM Authorized by: Sharlene Motts Consent: Verbal consent obtained. Risks and benefits: risks, benefits and alternatives were discussed Consent given by: patient Splint applied by: orthopedic technician Location details: R foot Splint type: cam walker    Supplies used: cam walker Post-procedure: The splinted body part was neurovascularly unchanged following the procedure. Patient tolerance: Patient tolerated the procedure well with no immediate complications.    Labs Review Labs Reviewed - No data to display  Imaging Review Dg Foot Complete Right  07/10/2014   CLINICAL DATA:  Patient fell down steps 11 days prior  EXAM: RIGHT FOOT COMPLETE - 3+ VIEW  COMPARISON:  None.  FINDINGS: Frontal, oblique, and lateral views were obtained. There is a spiral fracture of the mid to distal fifth metatarsal in near anatomic alignment. A tiny bony fragment is noted just lateral to the disrupted cortex along the lateral distal fifth metatarsal. No other fracture. No dislocation. Joint spaces appear intact. Incidental note is made of hallux valgus deformity at the first MTP joint.  IMPRESSION: Spiral fracture mid to distal fifth metatarsal in near anatomic alignment. Mild hallux valgus deformity first MTP joint.   Electronically Signed   By: Bretta Bang III M.D.   On: 07/10/2014 11:28     EKG Interpretation None     Meds given in ED:  Medications - No data to display  New Prescriptions   No medications on file   Filed Vitals:   07/10/14 0952  BP: 122/76  Pulse: 67  Temp: 98 F (36.7 C)  TempSrc: Oral  Resp: 16  SpO2: 100%    MDM  Vitals stable - WNL -afebrile Pt resting  comfortably in ED. received ice pack in ED. PE--diffuse swelling to the dorsal aspect of metatarsals. No evidence of open fracture. No other lesions or gross deformities. Distal pulses intact with brisk cap refill. Imaging--x-ray of right foot shows spiral fracture to distal fifth metatarsal in near anatomic alignment.  DDX--Will place patient in cam walker boot, given referral to orthopedics for definitive care. Will DC with pain medicines.  Rice therapy. Continue with ibuprofen use at home. I discussed all relevant lab findings and imaging results with pt and they verbalized understanding. Discussed f/u with PCP within 48 hrs and return precautions, pt very amenable to plan.  Final diagnoses:  Foot fracture, right, closed, initial encounter  Joycie Peek, PA-C 07/10/14 1348  Richardean Canal, MD 07/10/14 1524

## 2014-07-10 NOTE — Discharge Instructions (Signed)
Fracture A fracture is a break in a bone, due to a force on the bone that is greater than the bone's strength can handle. There are many types of fractures, including:  Complete fracture: The break passes completely through the bone.  Displaced: The ends of the bone fragments are not properly aligned.  Non-displaced: The ends of the bone fragments are in proper alignment.  Incomplete fracture (greenstick): The break does not pass completely through the bone. Incomplete fractures may or may not be angular (angulated).  Open fracture (compound): Part of the broken bone pokes through the skin. Open fractures have a high risk for infection.  Closed fracture: The fracture has not broken through the skin.  Comminuted fracture: The bone is broken into more than two pieces.  Compression fracture: The break occurs from extreme pressure on the bone (includes crushing injury).  Impacted fracture: The broken bone ends have been driven into each other.  Avulsion fracture: A ligament or tendon pulls a small piece of bone off from the main bony segment.  Pathologic fracture: A fracture due to the bone being made weak by a disease (osteoporosis or tumors).  Stress fracture: A fracture caused by intense exercise or repetitive and prolonged pressure that makes the bone weak. SYMPTOMS   Pain, tenderness, bleeding, bruising, and swelling at the fracture site.  Weakness and inability to bear weight on the injured extremity.  Paleness and deformity (sometimes).  Loss of pulse, numbness, tingling, or paralysis below the fracture site (usually a limb); these are emergencies. CAUSES  Bone being subjected to a force greater than its strength. RISK INCREASES WITH:  Contact sports and falls from heights.  Previous or current bone problems (osteoporosis or tumors).  Poor balance.  Poor strength and flexibility. PREVENTION   Warm up and stretch properly before activity.  Maintain physical  fitness:  Cardiovascular fitness.  Muscle strength.  Flexibility and endurance.  Wear proper protective equipment.  Use proper exercise technique. RELATED COMPLICATIONS   Bone fails to heal (nonunion).  Bone heals in a poor position (malunion).  Low blood volume (hypovolemic), shock due to blood loss.  Clump of fat cells travels through the blood (fat embolus) from the injury site to the lungs or brain (more common with thigh fractures).  Obstruction of nearby arteries. TREATMENT  Treatment first requires realigning of the bones (reduction) by a medically trained person, if the fracture is displaced. After realignment if the fracture is completed, or for non-displaced fractures, ice and medicine are used to reduce pain and inflammation. The bone and adjacent joints are then restrained with a splint, cast, or brace to allow the bones to heal without moving. Surgery is sometimes needed, to reposition the bones and hold the position with rods, pins, plates, or screws. Restraint for long periods of time may result in muscle and joint weakness or build up of fluid in tissues (edema). For this reason, physical therapy is often needed to regain strength and full range of motion. Recovery is complete when there is no bone motion at the fracture site and x-rays (radiographs) show complete healing.  MEDICATION   General anesthesia, sedation, or muscle relaxants may be needed to allow for realignment of the fracture. If pain medicine is needed, nonsteroidal anti-inflammatory medicines (aspirin and ibuprofen), or other minor pain relievers (acetaminophen), are often advised.  Do not take pain medicine for 7 days before surgery.  Stronger pain relievers may be prescribed by your caregiver. Use only as directed and only as much   as you need. SEEK MEDICAL CARE IF:   The following occur after restraint or surgery. (Report any of these signs immediately):  Swelling above or below the fracture  site.  Severe, persistent pain.  Blue or gray skin below the fracture site, especially under the nails. Numbness or loss of feeling below the fracture site. Document Released: 04/13/2005 Document Revised: 03/30/2012 Document Reviewed: 07/26/2008 Exit[employer] Patient Information 2015 [employer]. This information is not intended to replace advice given to you by your health care provider. Make sure you discuss any questions you have with your health care provider.  

## 2014-07-10 NOTE — ED Notes (Signed)
Ortho tec at bedside Ice pack applied

## 2014-07-10 NOTE — ED Notes (Signed)
Pt reports r/foot pain and swelling 10 days post fall. Pt stated that she fell down 2 steps. C/o increased difficulty putting pressure on r/foot

## 2015-01-20 ENCOUNTER — Emergency Department (HOSPITAL_COMMUNITY)
Admission: EM | Admit: 2015-01-20 | Discharge: 2015-01-20 | Disposition: A | Discharge status: Home / Self Care | Payer: Medicaid Other

## 2015-01-21 ENCOUNTER — Encounter (HOSPITAL_COMMUNITY): Payer: Self-pay | Admitting: Emergency Medicine

## 2015-01-21 ENCOUNTER — Emergency Department (HOSPITAL_COMMUNITY)
Admission: EM | Admit: 2015-01-21 | Discharge: 2015-01-21 | Disposition: A | Discharge status: Home / Self Care | Payer: Medicaid Other | Attending: Emergency Medicine | Admitting: Emergency Medicine

## 2015-01-21 DIAGNOSIS — Z8619 Personal history of other infectious and parasitic diseases: Secondary | ICD-10-CM | POA: Diagnosis not present

## 2015-01-21 DIAGNOSIS — Z8751 Personal history of pre-term labor: Secondary | ICD-10-CM | POA: Diagnosis not present

## 2015-01-21 DIAGNOSIS — R112 Nausea with vomiting, unspecified: Secondary | ICD-10-CM | POA: Insufficient documentation

## 2015-01-21 DIAGNOSIS — R1084 Generalized abdominal pain: Secondary | ICD-10-CM | POA: Insufficient documentation

## 2015-01-21 DIAGNOSIS — Z3202 Encounter for pregnancy test, result negative: Secondary | ICD-10-CM | POA: Diagnosis not present

## 2015-01-21 DIAGNOSIS — R109 Unspecified abdominal pain: Secondary | ICD-10-CM

## 2015-01-21 DIAGNOSIS — R011 Cardiac murmur, unspecified: Secondary | ICD-10-CM | POA: Diagnosis not present

## 2015-01-21 LAB — COMPREHENSIVE METABOLIC PANEL
ALT: 14 U/L (ref 14–54)
AST: 28 U/L (ref 15–41)
Albumin: 4.2 g/dL (ref 3.5–5.0)
Alkaline Phosphatase: 52 U/L (ref 38–126)
Anion gap: 6 (ref 5–15)
BUN: 9 mg/dL (ref 6–20)
CO2: 25 mmol/L (ref 22–32)
Calcium: 9.1 mg/dL (ref 8.9–10.3)
Chloride: 106 mmol/L (ref 101–111)
Creatinine, Ser: 0.97 mg/dL (ref 0.44–1.00)
GFR calc Af Amer: >60 mL/min (ref >60)
GFR calc non Af Amer: >60 mL/min (ref >60)
Glucose, Bld: 99 mg/dL (ref 65–99)
Potassium: 3.7 mmol/L (ref 3.5–5.1)
Sodium: 137 mmol/L (ref 135–145)
Total Bilirubin: 1 mg/dL (ref 0.3–1.2)
Total Protein: 7.5 g/dL (ref 6.5–8.1)

## 2015-01-21 LAB — URINALYSIS, ROUTINE W REFLEX MICROSCOPIC
Bilirubin Urine: NEGATIVE
Glucose, UA: NEGATIVE mg/dL
Ketones, ur: NEGATIVE mg/dL
Leukocytes, UA: NEGATIVE
Nitrite: NEGATIVE
Protein, ur: NEGATIVE mg/dL
Specific Gravity, Urine: 1.014 (ref 1.005–1.030)
Urobilinogen, UA: 1 mg/dL (ref 0.0–1.0)
pH: 5.5 (ref 5.0–8.0)

## 2015-01-21 LAB — URINE MICROSCOPIC-ADD ON

## 2015-01-21 LAB — I-STAT BETA HCG BLOOD, ED (MC, WL, AP ONLY): I-stat hCG, quantitative: <5 m[IU]/mL (ref <5)

## 2015-01-21 LAB — CBC
HCT: 42.2 % (ref 36.0–46.0)
Hemoglobin: 14.8 g/dL (ref 12.0–15.0)
MCH: 32.2 pg (ref 26.0–34.0)
MCHC: 35.1 g/dL (ref 30.0–36.0)
MCV: 91.7 fL (ref 78.0–100.0)
Platelets: 178 10*3/uL (ref 150–400)
RBC: 4.6 MIL/uL (ref 3.87–5.11)
RDW: 12.8 % (ref 11.5–15.5)
WBC: 5.2 10*3/uL (ref 4.0–10.5)

## 2015-01-21 LAB — LIPASE, BLOOD: Lipase: 18 U/L — ABNORMAL LOW (ref 22–51)

## 2015-01-21 MED ORDER — ONDANSETRON HCL 4 MG PO TABS: 4.0000 mg | ORAL_TABLET | Freq: Four times a day (QID) | ORAL | Status: DC

## 2015-01-21 MED ORDER — TRAMADOL HCL 50 MG PO TABS: 50.0000 mg | ORAL_TABLET | Freq: Four times a day (QID) | ORAL | Status: DC | PRN

## 2015-01-21 MED ORDER — ONDANSETRON HCL 4 MG/2ML IJ SOLN
4.0000 mg | Freq: Once | INTRAMUSCULAR | Status: AC
  Administered 2015-01-21: 4 mg via INTRAVENOUS
  Filled 2015-01-21: qty 2

## 2015-01-21 MED ORDER — KETOROLAC TROMETHAMINE 15 MG/ML IJ SOLN
15.0000 mg | Freq: Once | INTRAMUSCULAR | Status: AC
  Administered 2015-01-21: 15 mg via INTRAVENOUS
  Filled 2015-01-21: qty 1

## 2015-01-21 MED ORDER — MORPHINE SULFATE (PF) 4 MG/ML IV SOLN
4.0000 mg | Freq: Once | INTRAVENOUS | Status: AC
  Administered 2015-01-21: 4 mg via INTRAVENOUS
  Filled 2015-01-21: qty 1

## 2015-01-21 MED ORDER — SODIUM CHLORIDE 0.9 % IV BOLUS (SEPSIS)
1000.0000 mL | Freq: Once | INTRAVENOUS | Status: AC
  Administered 2015-01-21: 1000 mL via INTRAVENOUS

## 2015-01-21 NOTE — Progress Notes (Signed)
Medicaid [place] access lists pcp as [facility] with Dr Alwyn Pea as pcp [phone]

## 2015-01-21 NOTE — Progress Notes (Signed)
Tanya Bailey is her [employer] access doctor EPIC updated

## 2015-01-21 NOTE — ED Notes (Signed)
Patient c/o lower abdominal pain, emesis, bloating, fatigue, headache and hematuria onset Sunday after drinking on Saturday. Pt came to ED yesterday but went home due to long wait times.

## 2015-01-21 NOTE — Discharge Instructions (Signed)
Abdominal Pain Many things can cause abdominal pain. Usually, abdominal pain is not caused by a disease and will improve without treatment. It can often be observed and treated at home. Your health care provider will do a physical exam and possibly order blood tests and X-rays to help determine the seriousness of your pain. However, in many cases, more time must pass before a clear cause of the pain can be found. Before that point, your health care provider may not know if you need more testing or further treatment. HOME CARE INSTRUCTIONS  Monitor your abdominal pain for any changes. The following actions may help to alleviate any discomfort you are experiencing:  Only take over-the-counter or prescription medicines as directed by your health care provider.  Do not take laxatives unless directed to do so by your health care provider.  Try a clear liquid diet (broth, tea, or water) as directed by your health care provider. Slowly move to a bland diet as tolerated. SEEK MEDICAL CARE IF:  You have unexplained abdominal pain.  You have abdominal pain associated with nausea or diarrhea.  You have pain when you urinate or have a bowel movement.  You experience abdominal pain that wakes you in the night.  You have abdominal pain that is worsened or improved by eating food.  You have abdominal pain that is worsened with eating fatty foods.  You have a fever. SEEK IMMEDIATE MEDICAL CARE IF:   Your pain does not go away within 2 hours.  You keep throwing up (vomiting).  Your pain is felt only in portions of the abdomen, such as the right side or the left lower portion of the abdomen.  You pass bloody or black tarry stools. MAKE SURE YOU:  Understand these instructions.   Will watch your condition.   Will get help right away if you are not doing well or get worse.  Document Released: 01/21/2005 Document Revised: 04/18/2013 Document Reviewed: 12/21/2012 [facility] Patient Information  2015 [place], [place]. This information is not intended to replace advice given to you by your health care provider. Make sure you discuss any questions you have with your health care provider.  Bartholin's Cyst or Abscess Bartholin's glands are small glands located within the folds of skin (labia) along the sides of the lower opening of the vagina (birth canal). A cyst may develop when the duct of the gland becomes blocked. When this happens, fluid that accumulates within the cyst can become infected. This is known as an abscess. The Bartholin gland produces a mucous fluid to lubricate the outside of the vagina during sexual intercourse. SYMPTOMS   Patients with a small cyst may not have any symptoms.  Mild discomfort to severe pain depending on the size of the cyst and if it is infected (abscess).  Pain, redness, and swelling around the lower opening of the vagina.  Painful intercourse.  Pressure in the perineal area.  Swelling of the lips of the vagina (labia).  The cyst or abscess can be on one side or both sides of the vagina. DIAGNOSIS   A large swelling is seen in the lower vagina area by your caregiver.  Painful to touch.  Redness and pain, if it is an abscess. TREATMENT   Sometimes the cyst will go away on its own.  Apply warm wet compresses to the area or take hot sitz baths several times a day.  An incision to drain the cyst or abscess with local anesthesia.  Culture the pus, if it  is an abscess.  Antibiotic treatment, if it is an abscess.  Cut open the gland and suture the edges to make the opening of the gland bigger (marsupialization).  Remove the whole gland if the cyst or abscess returns. PREVENTION   Practice good hygiene.  Clean the vaginal area with a mild soap and soft cloth when bathing.  Do not rub hard in the vaginal area when bathing.  Protect the crotch area with a padded cushion if you take long bike rides or ride horses.  Be sure you are  well lubricated when you have sexual intercourse. HOME CARE INSTRUCTIONS   If your cyst or abscess was opened, a small piece of gauze, or a drain, may have been placed in the wound to allow drainage. Do not remove this gauze or drain unless directed by your caregiver.  Wear feminine pads, not tampons, as needed for any drainage or bleeding.  If antibiotics were prescribed, take them exactly as directed. Finish the entire course.  Only take over-the-counter or prescription medicines for pain, discomfort, or fever as directed by your caregiver. SEEK IMMEDIATE MEDICAL CARE IF:   You have an increase in pain, redness, swelling, or drainage.  You have bleeding from the wound which results in the use of more than the number of pads suggested by your caregiver in 24 hours.  You have chills.  You have a fever.  You develop any new problems (symptoms) or aggravation of your existing condition. MAKE SURE YOU:   Understand these instructions.  Will watch your condition.  Will get help right away if you are not doing well or get worse. Document Released: 04/13/2005 Document Revised: 07/06/2011 Document Reviewed: 11/30/2007 [facility] Patient Information 2015 [place], [place]. This information is not intended to replace advice given to you by your health care provider. Make sure you discuss any questions you have with your health care provider.

## 2015-02-01 NOTE — ED Provider Notes (Signed)
CSN: [id number]     Arrival date & time 01/21/15  1547 History   First MD Initiated Contact with Patient 01/21/15 1822     Chief Complaint  Patient presents with  . Emesis  . Abdominal Pain     (Consider location/radiation/quality/duration/timing/severity/associated sxs/prior Treatment) HPI   29 year old female with abdominal pain. Lower abdomen. Onset on Sunday. Patient had been drinking alcohol the night before. She initially thought she was just go over them decide presents the emergency room when symptoms persisted longer than what she expected. Associated nausea and vomiting. Feels fatigued. Mild body aches and headache. It is some hematuria which has since resolved. No urinary complaints otherwise.  Past Medical History  Diagnosis Date  . Heart murmur     childhood  . Abnormal Pap smear   . Chlamydia   . Preterm labor    Past Surgical History  Procedure Laterality Date  . Tooth extraction     Family History  Problem Relation Age of Onset  . Anesthesia problems Neg Hx   . Alcohol abuse Neg Hx   . Arthritis Neg Hx   . Asthma Neg Hx   . Birth defects Neg Hx   . Cancer Neg Hx   . COPD Neg Hx   . Depression Neg Hx   . Diabetes Neg Hx   . Drug abuse Neg Hx   . Early death Neg Hx   . Hearing loss Neg Hx   . Heart disease Neg Hx   . Hyperlipidemia Neg Hx   . Hypertension Neg Hx   . Kidney disease Neg Hx   . Learning disabilities Neg Hx   . Mental illness Neg Hx   . Mental retardation Neg Hx   . Miscarriages / Stillbirths Neg Hx   . Stroke Neg Hx   . Vision loss Neg Hx   . Other Neg Hx    Social History  Substance Use Topics  . Smoking status: Never Smoker   . Smokeless tobacco: Never Used  . Alcohol Use: Yes     Comment: Special occas. when not pregnant   OB History    Gravida Para Term Preterm AB TAB SAB Ectopic Multiple Living   0 0 1 0 4     Review of Systems  All systems reviewed and negative, other than as noted in HPI.   Allergies   Review of patient's allergies indicates no known allergies.  Home Medications   Prior to Admission medications   Medication Sig Start Date End Date Taking? Authorizing Provider  acetaminophen (TYLENOL) 500 MG tablet Take 500 mg by mouth every 6 (six) hours as needed for mild pain or moderate pain.   Yes Historical Provider, MD  levonorgestrel (MIRENA) 20 MCG/24HR IUD 1 each by Intrauterine route once.   Yes Historical Provider, MD  ibuprofen (ADVIL,MOTRIN) 600 MG tablet Take 1 tablet (600 mg total) by mouth every 6 (six) hours as needed. Patient not taking: Reported on 01/21/2015 02/13/14   Tracey Harries, MD  ondansetron (ZOFRAN) 4 MG tablet Take 1 tablet (4 mg total) by mouth every 6 (six) hours. 01/21/15   Raeford Razor, MD  oxyCODONE-acetaminophen (PERCOCET/ROXICET) 5-325 MG per tablet Take 1 tablet by mouth every 6 (six) hours as needed (for pain scale less than 7). Patient not taking: Reported on 01/21/2015 07/10/14   Joycie Peek, PA-C  traMADol (ULTRAM) 50 MG tablet Take 1 tablet (50 mg total) by mouth every 6 (six) hours as needed. 01/21/15  Raeford Razor, MD   BP 118/79 mmHg  Pulse 59  Temp(Src) 97.9 F (36.6 C) (Oral)  Resp 16  SpO2 100%  Breastfeeding? No Physical Exam  Constitutional: She appears well-developed and well-nourished. No distress.  HENT:  Head: Normocephalic and atraumatic.  Eyes: Conjunctivae are normal. Right eye exhibits no discharge. Left eye exhibits no discharge.  Neck: Neck supple.  Cardiovascular: Normal rate, regular rhythm and normal heart sounds.  Exam reveals no gallop and no friction rub.   No murmur heard. Pulmonary/Chest: Effort normal and breath sounds normal. No respiratory distress.  Abdominal: Soft. She exhibits no distension. There is tenderness.  Mild diffuse abdominal tenderness without rebound or guarding. No distention.  Genitourinary:  No CVA tenderness  Musculoskeletal: She exhibits no edema or tenderness.  Neurological: She is  alert.  Skin: Skin is warm and dry.  Psychiatric: She has a normal mood and affect. Her behavior is normal. Thought content normal.  Nursing note and vitals reviewed.   ED Course  Procedures (including critical care time) Labs Review Labs Reviewed  LIPASE, BLOOD - Abnormal; Notable for the following:    Lipase 18 (*)    All other components within normal limits  URINALYSIS, ROUTINE W REFLEX MICROSCOPIC (NOT AT [facility]) - Abnormal; Notable for the following:    Hgb urine dipstick SMALL (*)    All other components within normal limits  COMPREHENSIVE METABOLIC PANEL  CBC  URINE MICROSCOPIC-ADD ON  I-STAT BETA HCG BLOOD, ED ([facility], [facility], [facility] ONLY)    Imaging Review No results found. I have personally reviewed and evaluated these images and lab results as part of my medical decision-making.   EKG Interpretation None      MDM   Final diagnoses:  Abdominal pain, unspecified abdominal location  Non-intractable vomiting with nausea, vomiting of unspecified type    76-year-old female with abdominal pain and nausea/vomiting. Low suspicion for emergent process. Possibly gastritis secondary to alcohol ingestion shortly before symptoms began. She is afebrile. Nontoxic. Feels significant better with fluids and symptomatic control. Workup fairly unremarkable. It has been determined that no acute conditions requiring further emergency intervention are present at this time. The patient has been advised of the diagnosis and plan. I reviewed any labs and imaging including any potential incidental findings. We have discussed signs and symptoms that warrant return to the ED and they are listed in the discharge instructions.      Raeford Razor, MD 02/01/15 [phone]

## 2015-07-28 ENCOUNTER — Inpatient Hospital Stay (HOSPITAL_COMMUNITY): Payer: Medicaid Other

## 2015-07-28 ENCOUNTER — Inpatient Hospital Stay (HOSPITAL_COMMUNITY)
Admission: AD | Admit: 2015-07-28 | Discharge: 2015-07-28 | Disposition: A | Discharge status: Home / Self Care | Payer: Medicaid Other | Source: Ambulatory Visit | Attending: Obstetrics and Gynecology | Admitting: Obstetrics and Gynecology

## 2015-07-28 ENCOUNTER — Encounter (HOSPITAL_COMMUNITY): Payer: Self-pay | Admitting: Certified Nurse Midwife

## 2015-07-28 DIAGNOSIS — Z3009 Encounter for other general counseling and advice on contraception: Secondary | ICD-10-CM

## 2015-07-28 DIAGNOSIS — T8384XA Pain from genitourinary prosthetic devices, implants and grafts, initial encounter: Secondary | ICD-10-CM | POA: Diagnosis not present

## 2015-07-28 DIAGNOSIS — N941 Unspecified dyspareunia: Secondary | ICD-10-CM | POA: Diagnosis not present

## 2015-07-28 DIAGNOSIS — R102 Pelvic and perineal pain: Secondary | ICD-10-CM | POA: Diagnosis not present

## 2015-07-28 DIAGNOSIS — Z30432 Encounter for removal of intrauterine contraceptive device: Secondary | ICD-10-CM | POA: Diagnosis not present

## 2015-07-28 DIAGNOSIS — Y768 Miscellaneous obstetric and gynecological devices associated with adverse incidents, not elsewhere classified: Secondary | ICD-10-CM | POA: Insufficient documentation

## 2015-07-28 DIAGNOSIS — T839XXA Unspecified complication of genitourinary prosthetic device, implant and graft, initial encounter: Secondary | ICD-10-CM

## 2015-07-28 LAB — WET PREP, GENITAL
CLUE CELLS WET PREP: NONE SEEN
Sperm: NONE SEEN
TRICH WET PREP: NONE SEEN
YEAST WET PREP: NONE SEEN

## 2015-07-28 LAB — URINALYSIS, ROUTINE W REFLEX MICROSCOPIC
BILIRUBIN URINE: NEGATIVE
GLUCOSE, UA: NEGATIVE mg/dL
KETONES UR: 15 mg/dL — AB
NITRITE: NEGATIVE
PH: 6 (ref 5.0–8.0)
Protein, ur: NEGATIVE mg/dL
Specific Gravity, Urine: 1.025 (ref 1.005–1.030)

## 2015-07-28 LAB — URINE MICROSCOPIC-ADD ON: BACTERIA UA: NONE SEEN

## 2015-07-28 LAB — CBC
HCT: 38.5 % (ref 36.0–46.0)
Hemoglobin: 13.4 g/dL (ref 12.0–15.0)
MCH: 31.5 pg (ref 26.0–34.0)
MCHC: 34.8 g/dL (ref 30.0–36.0)
MCV: 90.6 fL (ref 78.0–100.0)
PLATELETS: 164 10*3/uL (ref 150–400)
RBC: 4.25 MIL/uL (ref 3.87–5.11)
RDW: 12.3 % (ref 11.5–15.5)
WBC: 5.5 10*3/uL (ref 4.0–10.5)

## 2015-07-28 LAB — POCT PREGNANCY, URINE: Preg Test, Ur: NEGATIVE

## 2015-07-28 MED ORDER — NORGESTIMATE-ETH ESTRADIOL 0.25-35 MG-MCG PO TABS: 1.0000 | ORAL_TABLET | Freq: Every day | ORAL | Status: DC

## 2015-07-28 NOTE — MAU Provider Note (Signed)
Chief Complaint: Abdominal Pain   None     SUBJECTIVE HPI: Tanya Bailey is a 30 y.o. [id number] who presents to maternity admissions reporting pelvic pain and pain with intercourse x 2-3 months, increasing in last 2 weeks. She describes the pain as sharp, low in her pelvis, and worsened by inserting a tampon or having intercourse.  She has not tried anything for her pain other than not using tampons or having sex.  She denies changes to her menses which are light and irregular since the Mirena IUD was placed in October of 2015, after her last baby.  She is not currently having vaginal bleeding. She feels like the IUD may have been dislodged during intercourse and now it is too painful so she is not having sex with her husband.  She is not planning a pregnancy right away but does desire another pregnancy in the near future and plans a BTL after her next baby for birth control.   She denies vaginal bleeding, vaginal itching/burning, urinary symptoms, h/a, dizziness, n/v, or fever/chills.     HPI  Past Medical History  Diagnosis Date  . Heart murmur     childhood  . Abnormal Pap smear   . Chlamydia   . Preterm labor    Past Surgical History  Procedure Laterality Date  . Tooth extraction     Social History   Social History  . Marital Status: Married    Spouse Name: N/A  . Number of Children: N/A  . Years of Education: N/A   Occupational History  . Not on file.   Social History Main Topics  . Smoking status: Never Smoker   . Smokeless tobacco: Never Used  . Alcohol Use: No     Comment: Special occas. when not pregnant  . Drug Use: No  . Sexual Activity: Yes    Birth Control/ Protection: IUD   Other Topics Concern  . Not on file   Social History Narrative   No current facility-administered medications on file prior to encounter.   Current Outpatient Prescriptions on File Prior to Encounter  Medication Sig Dispense Refill  . levonorgestrel (MIRENA) 20 MCG/24HR IUD 1  each by Intrauterine route once.    . ondansetron (ZOFRAN) 4 MG tablet Take 1 tablet (4 mg total) by mouth every 6 (six) hours. (Patient not taking: Reported on 07/28/2015) 12 tablet 0  . traMADol (ULTRAM) 50 MG tablet Take 1 tablet (50 mg total) by mouth every 6 (six) hours as needed. (Patient not taking: Reported on 07/28/2015) 8 tablet 0   No Known Allergies  ROS:  Review of Systems  Constitutional: Negative for fever, chills and fatigue.  Respiratory: Negative for shortness of breath.   Cardiovascular: Negative for chest pain.  Genitourinary: Positive for pelvic pain. Negative for dysuria, flank pain, vaginal bleeding, vaginal discharge, difficulty urinating and vaginal pain.  Neurological: Negative for dizziness and headaches.  Psychiatric/Behavioral: Negative.      I have reviewed patient's Past Medical Hx, Surgical Hx, Family Hx, Social Hx, medications and allergies.   Physical Exam   Patient Vitals for the past 24 hrs:  BP Temp Temp src Pulse Resp Height Weight  07/28/15 1538 113/66 mmHg - - 71 - - -  07/28/15 1320 122/67 mmHg 98.2 F (36.8 C) Oral 94 20 5\' 7"  (1.702 m) 188 lb 12.8 oz (85.639 kg)   Constitutional: Well-developed, well-nourished female in no acute distress.  Cardiovascular: normal rate Respiratory: normal effort GI: Abd soft, non-tender. Pos BS  x 4 MS: Extremities nontender, no edema, normal ROM Neurologic: Alert and oriented x 4.  GU: Neg CVAT.  PELVIC EXAM: Cervix pink, visually closed, without lesion, IUD string clearly visualized but device not visible in os and not touched by cotton swab inserted gently into os, scant white creamy discharge, vaginal walls and external genitalia normal Bimanual exam: Cervix 0/long/high, firm, anterior, positive CMT with tenderness as soon as cervix touched not just with motion,  uterus nontender, nonenlarged, adnexa without tenderness, enlargement, or mass  Second speculum exam performed after [place] and Mirena IUD removed  easily without complication   LAB RESULTS Results for orders placed or performed during the hospital encounter of 07/28/15 (from the past 24 hour(s))  Urinalysis, Routine w reflex microscopic (not at [facility])     Status: Abnormal   Collection Time: 07/28/15  1:10 PM  Result Value Ref Range   Color, Urine YELLOW YELLOW   APPearance CLEAR CLEAR   Specific Gravity, Urine 1.025 1.005 - 1.030   pH 6.0 5.0 - 8.0   Glucose, UA NEGATIVE NEGATIVE mg/dL   Hgb urine dipstick TRACE (A) NEGATIVE   Bilirubin Urine NEGATIVE NEGATIVE   Ketones, ur 15 (A) NEGATIVE mg/dL   Protein, ur NEGATIVE NEGATIVE mg/dL   Nitrite NEGATIVE NEGATIVE   Leukocytes, UA TRACE (A) NEGATIVE  Urine microscopic-add on     Status: Abnormal   Collection Time: 07/28/15  1:10 PM  Result Value Ref Range   Squamous Epithelial / LPF 0-5 (A) NONE SEEN   WBC, UA 0-5 0 - 5 WBC/hpf   RBC / HPF 0-5 0 - 5 RBC/hpf   Bacteria, UA NONE SEEN NONE SEEN   Urine-Other MUCOUS PRESENT   Pregnancy, urine POC     Status: None   Collection Time: 07/28/15  1:20 PM  Result Value Ref Range   Preg Test, Ur NEGATIVE NEGATIVE  Wet prep, genital     Status: Abnormal   Collection Time: 07/28/15  2:10 PM  Result Value Ref Range   Yeast Wet Prep HPF POC NONE SEEN NONE SEEN   Trich, Wet Prep NONE SEEN NONE SEEN   Clue Cells Wet Prep HPF POC NONE SEEN NONE SEEN   WBC, Wet Prep HPF POC FEW (A) NONE SEEN   Sperm NONE SEEN   CBC     Status: None   Collection Time: 07/28/15  2:25 PM  Result Value Ref Range   WBC 5.5 4.0 - 10.5 K/uL   RBC 4.25 3.87 - 5.11 MIL/uL   Hemoglobin 13.4 12.0 - 15.0 g/dL   HCT 16.1 09.6 - 04.5 %   MCV 90.6 78.0 - 100.0 fL   MCH 31.5 26.0 - 34.0 pg   MCHC 34.8 30.0 - 36.0 g/dL   RDW 40.9 81.1 - 91.4 %   Platelets 164 150 - 400 K/uL       IMAGING US Transvaginal Non-ob  07/28/2015  CLINICAL DATA:  Dyspareunia. IUD in place. Dark vaginal bleeding. Negative urine pregnancy test today. EXAM: TRANSABDOMINAL AND TRANSVAGINAL  ULTRASOUND OF PELVIS TECHNIQUE: Both transabdominal and transvaginal ultrasound examinations of the pelvis were performed. Transabdominal technique was performed for global imaging of the pelvis including uterus, ovaries, adnexal regions, and pelvic cul-de-sac. It was necessary to proceed with endovaginal exam following the transabdominal exam to visualize the endometrium and ovaries. COMPARISON:  None FINDINGS: Uterus Measurements: 4.0 x 5.3 x 9.0 cm. No fibroids or other mass visualized. Endometrium Thickness: 4 mm. IUD somewhat low in position over the  endometrial cavity in the lower uterine segment. Right ovary Measurements: 2.2 x 2.0 x 2.9 cm. Normal appearance/no adnexal mass. Left ovary Measurements: 2.0 x 2.0 x 3.7 cm. Normal appearance/no adnexal mass. Other findings No abnormal free fluid. IMPRESSION: Normal pelvic ultrasound. IUD somewhat low in position over the endometrial cavity in the lower uterine segment. Electronically Signed   By: Elberta Fortis M.D.   On: 07/28/2015 15:14   US Pelvis Complete  07/28/2015  CLINICAL DATA:  Dyspareunia. IUD in place. Dark vaginal bleeding. Negative urine pregnancy test today. EXAM: TRANSABDOMINAL AND TRANSVAGINAL ULTRASOUND OF PELVIS TECHNIQUE: Both transabdominal and transvaginal ultrasound examinations of the pelvis were performed. Transabdominal technique was performed for global imaging of the pelvis including uterus, ovaries, adnexal regions, and pelvic cul-de-sac. It was necessary to proceed with endovaginal exam following the transabdominal exam to visualize the endometrium and ovaries. COMPARISON:  None FINDINGS: Uterus Measurements: 4.0 x 5.3 x 9.0 cm. No fibroids or other mass visualized. Endometrium Thickness: 4 mm. IUD somewhat low in position over the endometrial cavity in the lower uterine segment. Right ovary Measurements: 2.2 x 2.0 x 2.9 cm. Normal appearance/no adnexal mass. Left ovary Measurements: 2.0 x 2.0 x 3.7 cm. Normal appearance/no adnexal  mass. Other findings No abnormal free fluid. IMPRESSION: Normal pelvic ultrasound. IUD somewhat low in position over the endometrial cavity in the lower uterine segment. Electronically Signed   By: Elberta Fortis M.D.   On: 07/28/2015 15:14    MAU Management/MDM: Ordered labs and reviewed results.  IUD in uterus but low, possibly causing pain at top of cervical canal.  Pt pain gone instantly after device removed.  Consult Dr Jackelyn Knife.   Pt denies personal or family hx of DVT/PE and is nonsmoker without HTN.  Rx for OCPs, pt to f/u in office.  Pregnancy desired in near future. Recommend folic acid/PNV daily.  Pt stable at time of discharge.  ASSESSMENT 1. Pelvic pain in female   2. Complication of intrauterine device (IUD) (HCC)   3. Dyspareunia in female   4. Encounter for IUD removal   5. Encounter for other general counseling or advice on contraception     PLAN Discharge home    Medication List    STOP taking these medications        levonorgestrel 20 MCG/24HR IUD  Commonly known as:  MIRENA     ondansetron 4 MG tablet  Commonly known as:  ZOFRAN     traMADol 50 MG tablet  Commonly known as:  ULTRAM      TAKE these medications        norgestimate-ethinyl estradiol 0.25-35 MG-MCG tablet  Commonly known as:  ORTHO-CYCLEN,SPRINTEC,PREVIFEM  Take 1 tablet by mouth daily.       Follow-up Information    Follow up with Bing Plume, MD.   Specialty:  Obstetrics and Gynecology   Why:  As needed, Return to MAU as needed for emergencies   Contact information:   [address] [place] [address] [phone]       Sharen Counter Certified Nurse-Midwife 07/28/2015  3:41 PM

## 2015-07-28 NOTE — MAU Note (Signed)
Pt states she had intercourse and since then she has had abdominal pain. Pt states "it feels like a tampon isn't in the place". Pt states this pain has lasted 2 weeks and rates it an 8/10. Pt denies vaginal bleeding or discharge.

## 2015-07-28 NOTE — Discharge Instructions (Signed)
Contraception Choices Contraception (birth control) is the use of any methods or devices to prevent pregnancy. Below are some methods to help avoid pregnancy. HORMONAL METHODS   Contraceptive implant. This is a thin, plastic tube containing progesterone hormone. It does not contain estrogen hormone. Your health care provider inserts the tube in the inner part of the upper arm. The tube can remain in place for up to 3 years. After 3 years, the implant must be removed. The implant prevents the ovaries from releasing an egg (ovulation), thickens the cervical mucus to prevent sperm from entering the uterus, and thins the lining of the inside of the uterus.  Progesterone-only injections. These injections are given every 3 months by your health care provider to prevent pregnancy. This synthetic progesterone hormone stops the ovaries from releasing eggs. It also thickens cervical mucus and changes the uterine lining. This makes it harder for sperm to survive in the uterus.  Birth control pills. These pills contain estrogen and progesterone hormone. They work by preventing the ovaries from releasing eggs (ovulation). They also cause the cervical mucus to thicken, preventing the sperm from entering the uterus. Birth control pills are prescribed by a health care provider.Birth control pills can also be used to treat heavy periods.  Minipill. This type of birth control pill contains only the progesterone hormone. They are taken every day of each month and must be prescribed by your health care provider.  Birth control patch. The patch contains hormones similar to those in birth control pills. It must be changed once a week and is prescribed by a health care provider.  Vaginal ring. The ring contains hormones similar to those in birth control pills. It is left in the vagina for 3 weeks, removed for 1 week, and then a new one is put back in place. The patient must be comfortable inserting and removing the ring  from the vagina.A health care provider's prescription is necessary.  Emergency contraception. Emergency contraceptives prevent pregnancy after unprotected sexual intercourse. This pill can be taken right after sex or up to 5 days after unprotected sex. It is most effective the sooner you take the pills after having sexual intercourse. Most emergency contraceptive pills are available without a prescription. Check with your pharmacist. Do not use emergency contraception as your only form of birth control. BARRIER METHODS   Female condom. This is a thin sheath (latex or rubber) that is worn over the penis during sexual intercourse. It can be used with spermicide to increase effectiveness.  Female condom. This is a soft, loose-fitting sheath that is put into the vagina before sexual intercourse.  Diaphragm. This is a soft, latex, dome-shaped barrier that must be fitted by a health care provider. It is inserted into the vagina, along with a spermicidal jelly. It is inserted before intercourse. The diaphragm should be left in the vagina for 6 to 8 hours after intercourse.  Cervical cap. This is a round, soft, latex or plastic cup that fits over the cervix and must be fitted by a health care provider. The cap can be left in place for up to 48 hours after intercourse.  Sponge. This is a soft, circular piece of polyurethane foam. The sponge has spermicide in it. It is inserted into the vagina after wetting it and before sexual intercourse.  Spermicides. These are chemicals that kill or block sperm from entering the cervix and uterus. They come in the form of creams, jellies, suppositories, foam, or tablets. They do not require a  prescription. They are inserted into the vagina with an applicator before having sexual intercourse. The process must be repeated every time you have sexual intercourse. INTRAUTERINE CONTRACEPTION  Intrauterine device (IUD). This is a T-shaped device that is put in a woman's uterus  during a menstrual period to prevent pregnancy. There are 2 types:  Copper IUD. This type of IUD is wrapped in copper wire and is placed inside the uterus. Copper makes the uterus and fallopian tubes produce a fluid that kills sperm. It can stay in place for 10 years.  Hormone IUD. This type of IUD contains the hormone progestin (synthetic progesterone). The hormone thickens the cervical mucus and prevents sperm from entering the uterus, and it also thins the uterine lining to prevent implantation of a fertilized egg. The hormone can weaken or kill the sperm that get into the uterus. It can stay in place for 3-5 years, depending on which type of IUD is used. PERMANENT METHODS OF CONTRACEPTION  Female tubal ligation. This is when the woman's fallopian tubes are surgically sealed, tied, or blocked to prevent the egg from traveling to the uterus.  Hysteroscopic sterilization. This involves placing a small coil or insert into each fallopian tube. Your doctor uses a technique called hysteroscopy to do the procedure. The device causes scar tissue to form. This results in permanent blockage of the fallopian tubes, so the sperm cannot fertilize the egg. It takes about 3 months after the procedure for the tubes to become blocked. You must use another form of birth control for these 3 months.  Female sterilization. This is when the female has the tubes that carry sperm tied off (vasectomy).This blocks sperm from entering the vagina during sexual intercourse. After the procedure, the man can still ejaculate fluid (semen). NATURAL PLANNING METHODS  Natural family planning. This is not having sexual intercourse or using a barrier method (condom, diaphragm, cervical cap) on days the woman could become pregnant.  Calendar method. This is keeping track of the length of each menstrual cycle and identifying when you are fertile.  Ovulation method. This is avoiding sexual intercourse during ovulation.  Symptothermal  method. This is avoiding sexual intercourse during ovulation, using a thermometer and ovulation symptoms.  Post-ovulation method. This is timing sexual intercourse after you have ovulated. Regardless of which type or method of contraception you choose, it is important that you use condoms to protect against the transmission of sexually transmitted infections (STIs). Talk with your health care provider about which form of contraception is most appropriate for you.   This information is not intended to replace advice given to you by your health care provider. Make sure you discuss any questions you have with your health care provider.   Document Released: 04/13/2005 Document Revised: 04/18/2013 Document Reviewed: 10/06/2012 Elsevier Interactive Patient Education 2016 [employer]  Pelvic Pain, Female Female pelvic pain can be caused by many different things and start from a variety of places. Pelvic pain refers to pain that is located in the lower half of the abdomen and between your hips. The pain may occur over a short period of time (acute) or may be reoccurring (chronic). The cause of pelvic pain may be related to disorders affecting the female reproductive organs (gynecologic), but it may also be related to the bladder, kidney stones, an intestinal complication, or muscle or skeletal problems. Getting help right away for pelvic pain is important, especially if there has been severe, sharp, or a sudden onset of unusual pain. It is  also important to get help right away because some types of pelvic pain can be life threatening.  CAUSES  Below are only some of the causes of pelvic pain. The causes of pelvic pain can be in one of several categories.   Gynecologic.  Pelvic inflammatory disease.  Sexually transmitted infection.  Ovarian cyst or a twisted ovarian ligament (ovarian torsion).  Uterine lining that grows outside the uterus (endometriosis).  Fibroids, cysts, or  tumors.  Ovulation.  Pregnancy.  Pregnancy that occurs outside the uterus (ectopic pregnancy).  Miscarriage.  Labor.  Abruption of the placenta or ruptured uterus.  Infection.  Uterine infection (endometritis).  Bladder infection.  Diverticulitis.  Miscarriage related to a uterine infection (septic abortion).  Bladder.  Inflammation of the bladder (cystitis).  Kidney stone(s).  Gastrointestinal.  Constipation.  Diverticulitis.  Neurologic.  Trauma.  Feeling pelvic pain because of mental or emotional causes (psychosomatic).  Cancers of the bowel or pelvis. EVALUATION  Your caregiver will want to take a careful history of your concerns. This includes recent changes in your health, a careful gynecologic history of your periods (menses), and a sexual history. Obtaining your family history and medical history is also important. Your caregiver may suggest a pelvic exam. A pelvic exam will help identify the location and severity of the pain. It also helps in the evaluation of which organ system may be involved. In order to identify the cause of the pelvic pain and be properly treated, your caregiver may order tests. These tests may include:   A pregnancy test.  Pelvic ultrasonography.  An X-ray exam of the abdomen.  A urinalysis or evaluation of vaginal discharge.  Blood tests. HOME CARE INSTRUCTIONS   Only take over-the-counter or prescription medicines for pain, discomfort, or fever as directed by your caregiver.   Rest as directed by your caregiver.   Eat a balanced diet.   Drink enough fluids to make your urine clear or pale yellow, or as directed.   Avoid sexual intercourse if it causes pain.   Apply warm or cold compresses to the lower abdomen depending on which one helps the pain.   Avoid stressful situations.   Keep a journal of your pelvic pain. Write down when it started, where the pain is located, and if there are things that seem to  be associated with the pain, such as food or your menstrual cycle.  Follow up with your caregiver as directed.  SEEK MEDICAL CARE IF:  Your medicine does not help your pain.  You have abnormal vaginal discharge. SEEK IMMEDIATE MEDICAL CARE IF:   You have heavy bleeding from the vagina.   Your pelvic pain increases.   You feel light-headed or faint.   You have chills.   You have pain with urination or blood in your urine.   You have uncontrolled diarrhea or vomiting.   You have a fever or persistent symptoms for more than 3 days.  You have a fever and your symptoms suddenly get worse.   You are being physically or sexually abused.   This information is not intended to replace advice given to you by your health care provider. Make sure you discuss any questions you have with your health care provider.   Document Released: 03/10/2004 Document Revised: 01/02/2015 Document Reviewed: 08/03/2011 Elsevier Interactive Patient Education [employer]

## 2015-07-29 LAB — GC/CHLAMYDIA PROBE AMP (~~LOC~~) NOT AT ARMC
CHLAMYDIA, DNA PROBE: NEGATIVE
NEISSERIA GONORRHEA: NEGATIVE

## 2015-07-29 LAB — HIV ANTIBODY (ROUTINE TESTING W REFLEX): HIV SCREEN 4TH GENERATION: NONREACTIVE

## 2016-04-27 NOTE — L&D Delivery Note (Signed)
Patient is 31 y.o. [id number] [redacted]w[redacted]d admitted with PTL. AROM one minute prior to delivery.  Prenatal course also complicated by h/o preterm deliveries, GBS+(not treated).    Goupil, Girl Milisa [[id number]]  Delivery Note At 6:13 AM a viable female was delivered via Vaginal, Spontaneous Delivery (Presentation: ROA ).  APGAR: 9, 9; weight  pending.   Placenta status: Intact .  Cord: 3V  with the following complications: None.  Cord pH: N/A  Anesthesia:  None Episiotomy: None Lacerations: Labial Suture Repair: monocryl Est. Blood Loss (mL): 250  Mom to postpartum.  Baby to Couplet care / Skin to Skin.  Upon arrival to floor patient was complete. She was AROM'd and a minute later delivered a viable infant. No nuchal cord present. Baby delivered without difficulty, was noted to have good tone and place on maternal abdomen for oral suctioning, drying and stimulation. Delayed cord clamping performed. Placenta delivered spontaneously with gentle cord traction. Fundus firm with massage and IM Pitocin. Perineum inspected and found to have labial laceration, which was repaired with good hemostasis achieved. Counts of sharps, instruments, and lap pads were all correct.   Caryl Ada, DO OB Fellow 01/20/2017, 6:57 AM

## 2016-06-25 ENCOUNTER — Inpatient Hospital Stay (HOSPITAL_COMMUNITY)
Admission: AD | Admit: 2016-06-25 | Discharge: 2016-06-25 | Disposition: A | Discharge status: Home / Self Care | Payer: Medicaid Other | Source: Ambulatory Visit | Attending: Obstetrics & Gynecology | Admitting: Obstetrics & Gynecology

## 2016-06-25 ENCOUNTER — Inpatient Hospital Stay (HOSPITAL_COMMUNITY): Payer: Medicaid Other

## 2016-06-25 ENCOUNTER — Encounter (HOSPITAL_COMMUNITY): Payer: Self-pay

## 2016-06-25 DIAGNOSIS — O26891 Other specified pregnancy related conditions, first trimester: Secondary | ICD-10-CM | POA: Insufficient documentation

## 2016-06-25 DIAGNOSIS — Z3490 Encounter for supervision of normal pregnancy, unspecified, unspecified trimester: Secondary | ICD-10-CM

## 2016-06-25 DIAGNOSIS — Z3A01 Less than 8 weeks gestation of pregnancy: Secondary | ICD-10-CM | POA: Insufficient documentation

## 2016-06-25 DIAGNOSIS — R102 Pelvic and perineal pain: Secondary | ICD-10-CM | POA: Diagnosis not present

## 2016-06-25 DIAGNOSIS — O2691 Pregnancy related conditions, unspecified, first trimester: Secondary | ICD-10-CM

## 2016-06-25 LAB — URINALYSIS, ROUTINE W REFLEX MICROSCOPIC
BACTERIA UA: NONE SEEN
Bilirubin Urine: NEGATIVE
Glucose, UA: NEGATIVE mg/dL
Hgb urine dipstick: NEGATIVE
Ketones, ur: NEGATIVE mg/dL
Leukocytes, UA: NEGATIVE
Nitrite: NEGATIVE
PROTEIN: NEGATIVE mg/dL
Specific Gravity, Urine: 1.027 (ref 1.005–1.030)
pH: 6 (ref 5.0–8.0)

## 2016-06-25 LAB — CBC
HEMATOCRIT: 31.3 % — AB (ref 36.0–46.0)
HEMOGLOBIN: 11.3 g/dL — AB (ref 12.0–15.0)
MCH: 31 pg (ref 26.0–34.0)
MCHC: 36.1 g/dL — AB (ref 30.0–36.0)
MCV: 85.8 fL (ref 78.0–100.0)
Platelets: 157 10*3/uL (ref 150–400)
RBC: 3.65 MIL/uL — ABNORMAL LOW (ref 3.87–5.11)
RDW: 12.5 % (ref 11.5–15.5)
WBC: 6.4 10*3/uL (ref 4.0–10.5)

## 2016-06-25 LAB — RPR: RPR: NONREACTIVE

## 2016-06-25 LAB — WET PREP, GENITAL
CLUE CELLS WET PREP: NONE SEEN
Sperm: NONE SEEN
Trich, Wet Prep: NONE SEEN
Yeast Wet Prep HPF POC: NONE SEEN

## 2016-06-25 LAB — POCT PREGNANCY, URINE: PREG TEST UR: POSITIVE — AB

## 2016-06-25 LAB — GC/CHLAMYDIA PROBE AMP (~~LOC~~) NOT AT ARMC
Chlamydia: NEGATIVE
NEISSERIA GONORRHEA: NEGATIVE

## 2016-06-25 LAB — HCG, QUANTITATIVE, PREGNANCY: hCG, Beta Chain, Quant, S: 58208 m[IU]/mL — ABNORMAL HIGH (ref <5)

## 2016-06-25 LAB — HIV ANTIBODY (ROUTINE TESTING W REFLEX): HIV Screen 4th Generation wRfx: NONREACTIVE

## 2016-06-25 NOTE — MAU Provider Note (Signed)
History     CSN: [id number]  Arrival date and time: 06/25/16 4098   First Provider Initiated Contact with Patient 06/25/16 [id number]      Chief Complaint  Patient presents with  . Pelvic Pain  . Abdominal Pain   Pelvic Pain  The patient's primary symptoms include pelvic pain. The patient's pertinent negatives include no vaginal discharge. This is a new problem. Episode onset: 2 days ago. The problem occurs intermittently. The problem has been unchanged. Pain severity now: 6/10  The problem affects the right side. She is pregnant. Associated symptoms include nausea. Pertinent negatives include no chills, constipation, diarrhea, dysuria, fever, urgency or vomiting. The vaginal discharge was normal. There has been no bleeding. Nothing aggravates the symptoms. She has tried nothing for the symptoms. She uses oral contraceptives (last took in [place]. ) for contraception. Her menstrual history has been regular (LMP 05/16/15 ).   Past Medical History:  Diagnosis Date  . Abnormal Pap smear   . Chlamydia   . Heart murmur    childhood  . Preterm labor     Past Surgical History:  Procedure Laterality Date  . TOOTH EXTRACTION      Family History  Problem Relation Age of Onset  . Anesthesia problems Neg Hx   . Alcohol abuse Neg Hx   . Arthritis Neg Hx   . Asthma Neg Hx   . Birth defects Neg Hx   . Cancer Neg Hx   . COPD Neg Hx   . Depression Neg Hx   . Diabetes Neg Hx   . Drug abuse Neg Hx   . Early death Neg Hx   . Hearing loss Neg Hx   . Heart disease Neg Hx   . Hyperlipidemia Neg Hx   . Hypertension Neg Hx   . Kidney disease Neg Hx   . Learning disabilities Neg Hx   . Mental illness Neg Hx   . Mental retardation Neg Hx   . Miscarriages / Stillbirths Neg Hx   . Stroke Neg Hx   . Vision loss Neg Hx   . Other Neg Hx     Social History  Substance Use Topics  . Smoking status: Never Smoker  . Smokeless tobacco: Never Used  . Alcohol use No     Comment: Special occas. when  not pregnant    Allergies: No Known Allergies  Prescriptions Prior to Admission  Medication Sig Dispense Refill Last Dose  . Prenatal Vit-Fe Fumarate-FA (PRENATAL MULTIVITAMIN) TABS tablet Take 1 tablet by mouth daily at 12 noon.   06/24/2016 at Unknown time  . norgestimate-ethinyl estradiol (ORTHO-CYCLEN,SPRINTEC,PREVIFEM) 0.25-35 MG-MCG tablet Take 1 tablet by mouth daily. 1 Package 11     Review of Systems  Constitutional: Negative for chills and fever.  Gastrointestinal: Positive for nausea. Negative for constipation, diarrhea and vomiting.  Genitourinary: Positive for pelvic pain. Negative for dysuria, urgency, vaginal bleeding and vaginal discharge.   Physical Exam   Blood pressure 109/74, pulse 72, temperature 98.2 F (36.8 C), temperature source Oral, resp. rate 18, height 5\' 8"  (1.727 m), weight 192 lb (87.1 kg), last menstrual period 05/15/2016, SpO2 100 %.  Physical Exam  Nursing note and vitals reviewed. Constitutional: She is oriented to person, place, and time. She appears well-developed and well-nourished. No distress.  HENT:  Head: Normocephalic.  Cardiovascular: Normal rate.   Respiratory: Effort normal.  GI: Soft. There is no tenderness. There is no rebound.  Neurological: She is alert and oriented to person, place, and  time.  Skin: Skin is warm and dry.  Psychiatric: She has a normal mood and affect.   Results for orders placed or performed during the hospital encounter of 06/25/16 (from the past 24 hour(s))  Urinalysis, Routine w reflex microscopic     Status: Abnormal   Collection Time: 06/25/16  3:54 AM  Result Value Ref Range   Color, Urine YELLOW YELLOW   APPearance CLEAR CLEAR   Specific Gravity, Urine 1.027 1.005 - 1.030   pH 6.0 5.0 - 8.0   Glucose, UA NEGATIVE NEGATIVE mg/dL   Hgb urine dipstick NEGATIVE NEGATIVE   Bilirubin Urine NEGATIVE NEGATIVE   Ketones, ur NEGATIVE NEGATIVE mg/dL   Protein, ur NEGATIVE NEGATIVE mg/dL   Nitrite NEGATIVE  NEGATIVE   Leukocytes, UA NEGATIVE NEGATIVE   RBC / HPF 0-5 0 - 5 RBC/hpf   WBC, UA 0-5 0 - 5 WBC/hpf   Bacteria, UA NONE SEEN NONE SEEN   Squamous Epithelial / LPF 0-5 (A) NONE SEEN   Mucous PRESENT   Pregnancy, urine POC     Status: Abnormal   Collection Time: 06/25/16  4:06 AM  Result Value Ref Range   Preg Test, Ur POSITIVE (A) NEGATIVE  Wet prep, genital     Status: Abnormal   Collection Time: 06/25/16  4:36 AM  Result Value Ref Range   Yeast Wet Prep HPF POC NONE SEEN NONE SEEN   Trich, Wet Prep NONE SEEN NONE SEEN   Clue Cells Wet Prep HPF POC NONE SEEN NONE SEEN   WBC, Wet Prep HPF POC FEW (A) NONE SEEN   Sperm NONE SEEN   CBC     Status: Abnormal   Collection Time: 06/25/16  4:48 AM  Result Value Ref Range   WBC 6.4 4.0 - 10.5 K/uL   RBC 3.65 (L) 3.87 - 5.11 MIL/uL   Hemoglobin 11.3 (L) 12.0 - 15.0 g/dL   HCT 16.1 (L) 09.6 - 04.5 %   MCV 85.8 78.0 - 100.0 fL   MCH 31.0 26.0 - 34.0 pg   MCHC 36.1 (H) 30.0 - 36.0 g/dL   RDW 40.9 81.1 - 91.4 %   Platelets 157 150 - 400 K/uL  US Ob Comp Less 14 Wks  Result Date: 06/25/2016 CLINICAL DATA:  Acute onset of pelvic pain and cramping. Initial encounter. EXAM: OBSTETRIC <14 WK [place] AND TRANSVAGINAL OB US TECHNIQUE: Both transabdominal and transvaginal ultrasound examinations were performed for complete evaluation of the gestation as well as the maternal uterus, adnexal regions, and pelvic cul-de-sac. Transvaginal technique was performed to assess early pregnancy. COMPARISON:  Pelvic ultrasound performed 07/28/2015 FINDINGS: Intrauterine gestational sac: Single; visualized and normal in shape. Yolk sac:  Yes Embryo:  Yes Cardiac Activity: Yes Heart Rate: 120  bpm CRL:  3.9  mm   6 w   0 d                  [place] EDC: 02/18/2017 Subchorionic hemorrhage:  None visualized. Maternal uterus/adnexae: The uterus is unremarkable in appearance. The right ovary is unremarkable, measuring 3.5 x 1.8 x 2.1 cm. No suspicious adnexal masses are seen; there  is no evidence for ovarian torsion. The left ovary is not visualized on this study. No free fluid is seen within the pelvic cul-de-sac. IMPRESSION: Single live intrauterine pregnancy noted, with a crown-rump length of 4 mm, corresponding to a gestational age of [redacted] weeks 0 days. This matches the gestational age of [redacted] weeks 6 days by LMP, reflecting an  estimated date of delivery of February 19, 2017. Electronically Signed   By: Roanna RaiderJeffery  Chang M.D.   On: 06/25/2016 05:31   [place] Ob Transvaginal  Result Date: 06/25/2016 CLINICAL DATA:  Acute onset of pelvic pain and cramping. Initial encounter. EXAM: OBSTETRIC <14 WK US AND TRANSVAGINAL OB US TECHNIQUE: Both transabdominal and transvaginal ultrasound examinations were performed for complete evaluation of the gestation as well as the maternal uterus, adnexal regions, and pelvic cul-de-sac. Transvaginal technique was performed to assess early pregnancy. COMPARISON:  Pelvic ultrasound performed 07/28/2015 FINDINGS: Intrauterine gestational sac: Single; visualized and normal in shape. Yolk sac:  Yes Embryo:  Yes Cardiac Activity: Yes Heart Rate: 120  bpm CRL:  3.9  mm   6 w   0 d                  US EDC: 02/18/2017 Subchorionic hemorrhage:  None visualized. Maternal uterus/adnexae: The uterus is unremarkable in appearance. The right ovary is unremarkable, measuring 3.5 x 1.8 x 2.1 cm. No suspicious adnexal masses are seen; there is no evidence for ovarian torsion. The left ovary is not visualized on this study. No free fluid is seen within the pelvic cul-de-sac. IMPRESSION: Single live intrauterine pregnancy noted, with a crown-rump length of 4 mm, corresponding to a gestational age of [redacted] weeks 0 days. This matches the gestational age of [redacted] weeks 6 days by LMP, reflecting an estimated date of delivery of February 19, 2017. Electronically Signed   By: Roanna RaiderJeffery  Chang M.D.   On: 06/25/2016 05:31     MAU Course  Procedures  MDM   Assessment and Plan   1. Intrauterine  pregnancy   2. Pelvic pain in pregnancy, antepartum, first trimester   3. [redacted] weeks gestation of pregnancy    DC home Comfort measures reviewed  1st Trimester precautions  RX: none  Return to MAU as needed FU with OB as planned  Follow-up Information    Bing PlumeHENLEY,THOMAS F, MD Follow up.   Specialty:  Obstetrics and Gynecology Contact information: [address], [address] [place] [id number] [phone]            Tawnya CrookHogan, Donyea Beverlin Donovan 06/25/2016, 4:34 AM

## 2016-06-25 NOTE — Discharge Instructions (Signed)
First Trimester of Pregnancy The first trimester of pregnancy is from week 1 until the end of week 13 (months 1 through 3). A week after a sperm fertilizes an egg, the egg will implant on the wall of the uterus. This embryo will begin to develop into a baby. Genes from you and your partner will form the baby. The female genes will determine whether the baby will be a boy or a girl. At 6-8 weeks, the eyes and face will be formed, and the heartbeat can be seen on ultrasound. At the end of 12 weeks, all the baby's organs will be formed. Now that you are pregnant, you will want to do everything you can to have a healthy baby. Two of the most important things are to get good prenatal care and to follow your health care provider's instructions. Prenatal care is all the medical care you receive before the baby's birth. This care will help prevent, find, and treat any problems during the pregnancy and childbirth. Body changes during your first trimester Your body goes through many changes during pregnancy. The changes vary from woman to woman.  You may gain or lose a couple of pounds at first.  You may feel sick to your stomach (nauseous) and you may throw up (vomit). If the vomiting is uncontrollable, call your health care provider.  You may tire easily.  You may develop headaches that can be relieved by medicines. All medicines should be approved by your health care provider.  You may urinate more often. Painful urination may mean you have a bladder infection.  You may develop heartburn as a result of your pregnancy.  You may develop constipation because certain hormones are causing the muscles that push stool through your intestines to slow down.  You may develop hemorrhoids or swollen veins (varicose veins).  Your breasts may begin to grow larger and become tender. Your nipples may stick out more, and the tissue that surrounds them (areola) may become darker.  Your gums may bleed and may be  sensitive to brushing and flossing.  Dark spots or blotches (chloasma, mask of pregnancy) may develop on your face. This will likely fade after the baby is born.  Your menstrual periods will stop.  You may have a loss of appetite.  You may develop cravings for certain kinds of food.  You may have changes in your emotions from day to day, such as being excited to be pregnant or being concerned that something may go wrong with the pregnancy and baby.  You may have more vivid and strange dreams.  You may have changes in your hair. These can include thickening of your hair, rapid growth, and changes in texture. Some women also have hair loss during or after pregnancy, or hair that feels dry or thin. Your hair will most likely return to normal after your baby is born.  What to expect at prenatal visits During a routine prenatal visit:  You will be weighed to make sure you and the baby are growing normally.  Your blood pressure will be taken.  Your abdomen will be measured to track your baby's growth.  The fetal heartbeat will be listened to between weeks 10 and 14 of your pregnancy.  Test results from any previous visits will be discussed.  Your health care provider may ask you:  How you are feeling.  If you are feeling the baby move.  If you have had any abnormal symptoms, such as leaking fluid, bleeding, severe headaches,   or abdominal cramping.  If you are using any tobacco products, including cigarettes, chewing tobacco, and electronic cigarettes.  If you have any questions.  Other tests that may be performed during your first trimester include:  Blood tests to find your blood type and to check for the presence of any previous infections. The tests will also be used to check for low iron levels (anemia) and protein on red blood cells (Rh antibodies). Depending on your risk factors, or if you previously had diabetes during pregnancy, you may have tests to check for high blood  sugar that affects pregnant women (gestational diabetes).  Urine tests to check for infections, diabetes, or protein in the urine.  An ultrasound to confirm the proper growth and development of the baby.  Fetal screens for spinal cord problems (spina bifida) and Down syndrome.  HIV (human immunodeficiency virus) testing. Routine prenatal testing includes screening for HIV, unless you choose not to have this test.  You may need other tests to make sure you and the baby are doing well.  Follow these instructions at home: Medicines  Follow your health care provider's instructions regarding medicine use. Specific medicines may be either safe or unsafe to take during pregnancy.  Take a prenatal vitamin that contains at least 600 micrograms (mcg) of folic acid.  If you develop constipation, try taking a stool softener if your health care provider approves. Eating and drinking  Eat a balanced diet that includes fresh fruits and vegetables, whole grains, good sources of protein such as meat, eggs, or tofu, and low-fat dairy. Your health care provider will help you determine the amount of weight gain that is right for you.  Avoid raw meat and uncooked cheese. These carry germs that can cause birth defects in the baby.  Eating four or five small meals rather than three large meals a day may help relieve nausea and vomiting. If you start to feel nauseous, eating a few soda crackers can be helpful. Drinking liquids between meals, instead of during meals, also seems to help ease nausea and vomiting.  Limit foods that are high in fat and processed sugars, such as fried and sweet foods.  To prevent constipation: ? Eat foods that are high in fiber, such as fresh fruits and vegetables, whole grains, and beans. ? Drink enough fluid to keep your urine clear or pale yellow. Activity  Exercise only as directed by your health care provider. Most women can continue their usual exercise routine during  pregnancy. Try to exercise for 30 minutes at least 5 days a week. Exercising will help you: ? Control your weight. ? Stay in shape. ? Be prepared for labor and delivery.  Experiencing pain or cramping in the lower abdomen or lower back is a good sign that you should stop exercising. Check with your health care provider before continuing with normal exercises.  Try to avoid standing for long periods of time. Move your legs often if you must stand in one place for a long time.  Avoid heavy lifting.  Wear low-heeled shoes and practice good posture.  You may continue to have sex unless your health care provider tells you not to. Relieving pain and discomfort  Wear a good support bra to relieve breast tenderness.  Take warm sitz baths to soothe any pain or discomfort caused by hemorrhoids. Use hemorrhoid cream if your health care provider approves.  Rest with your legs elevated if you have leg cramps or low back pain.  If you develop   varicose veins in your legs, wear support hose. Elevate your feet for 15 minutes, 3-4 times a day. Limit salt in your diet. Prenatal care  Schedule your prenatal visits by the twelfth week of pregnancy. They are usually scheduled monthly at first, then more often in the last 2 months before delivery.  Write down your questions. Take them to your prenatal visits.  Keep all your prenatal visits as told by your health care provider. This is important. Safety  Wear your seat belt at all times when driving.  Make a list of emergency phone numbers, including numbers for family, friends, the hospital, and police and fire departments. General instructions  Ask your health care provider for a referral to a local prenatal education class. Begin classes no later than the beginning of month 6 of your pregnancy.  Ask for help if you have counseling or nutritional needs during pregnancy. Your health care provider can offer advice or refer you to specialists for help  with various needs.  Do not use hot tubs, steam rooms, or saunas.  Do not douche or use tampons or scented sanitary pads.  Do not cross your legs for long periods of time.  Avoid cat litter boxes and soil used by cats. These carry germs that can cause birth defects in the baby and possibly loss of the fetus by miscarriage or stillbirth.  Avoid all smoking, herbs, alcohol, and medicines not prescribed by your health care provider. Chemicals in these products affect the formation and growth of the baby.  Do not use any products that contain nicotine or tobacco, such as cigarettes and e-cigarettes. If you need help quitting, ask your health care provider. You may receive counseling support and other resources to help you quit.  Schedule a dentist appointment. At home, brush your teeth with a soft toothbrush and be gentle when you floss. Contact a health care provider if:  You have dizziness.  You have mild pelvic cramps, pelvic pressure, or nagging pain in the abdominal area.  You have persistent nausea, vomiting, or diarrhea.  You have a bad smelling vaginal discharge.  You have pain when you urinate.  You notice increased swelling in your face, hands, legs, or ankles.  You are exposed to fifth disease or chickenpox.  You are exposed to German measles (rubella) and have never had it. Get help right away if:  You have a fever.  You are leaking fluid from your vagina.  You have spotting or bleeding from your vagina.  You have severe abdominal cramping or pain.  You have rapid weight gain or loss.  You vomit blood or material that looks like coffee grounds.  You develop a severe headache.  You have shortness of breath.  You have any kind of trauma, such as from a fall or a car accident. Summary  The first trimester of pregnancy is from week 1 until the end of week 13 (months 1 through 3).  Your body goes through many changes during pregnancy. The changes vary from  woman to woman.  You will have routine prenatal visits. During those visits, your health care provider will examine you, discuss any test results you may have, and talk with you about how you are feeling. This information is not intended to replace advice given to you by your health care provider. Make sure you discuss any questions you have with your health care provider. Document Released: 04/07/2001 Document Revised: 03/25/2016 Document Reviewed: 03/25/2016 Elsevier Interactive Patient Education  2017 [employer]  

## 2016-06-25 NOTE — MAU Note (Signed)
Pt states that she had a +upt at home on 06/09/2016. States for 3 days she has had sharp lower abdominal pain and cramping. States tonight it woke her out of her sleep. Rates 6/10. Has not taken anything for pain. Denies vag bleeding. LMP: 05/15/2016

## 2016-08-18 ENCOUNTER — Inpatient Hospital Stay (HOSPITAL_COMMUNITY)
Admission: AD | Admit: 2016-08-18 | Discharge: 2016-08-18 | Disposition: A | Discharge status: Home / Self Care | Payer: Medicaid Other | Source: Ambulatory Visit | Attending: Family Medicine | Admitting: Family Medicine

## 2016-08-18 ENCOUNTER — Encounter (HOSPITAL_COMMUNITY): Payer: Self-pay | Admitting: *Deleted

## 2016-08-18 DIAGNOSIS — O26899 Other specified pregnancy related conditions, unspecified trimester: Secondary | ICD-10-CM | POA: Diagnosis not present

## 2016-08-18 DIAGNOSIS — Z3A13 13 weeks gestation of pregnancy: Secondary | ICD-10-CM | POA: Insufficient documentation

## 2016-08-18 DIAGNOSIS — Z8751 Personal history of pre-term labor: Secondary | ICD-10-CM | POA: Insufficient documentation

## 2016-08-18 DIAGNOSIS — Z8619 Personal history of other infectious and parasitic diseases: Secondary | ICD-10-CM | POA: Insufficient documentation

## 2016-08-18 DIAGNOSIS — K529 Noninfective gastroenteritis and colitis, unspecified: Secondary | ICD-10-CM | POA: Diagnosis not present

## 2016-08-18 DIAGNOSIS — O26891 Other specified pregnancy related conditions, first trimester: Secondary | ICD-10-CM | POA: Insufficient documentation

## 2016-08-18 DIAGNOSIS — R109 Unspecified abdominal pain: Secondary | ICD-10-CM

## 2016-08-18 DIAGNOSIS — M545 Low back pain: Secondary | ICD-10-CM | POA: Diagnosis present

## 2016-08-18 HISTORY — DX: Unspecified ectopic pregnancy without intrauterine pregnancy: O00.90

## 2016-08-18 LAB — URINALYSIS, ROUTINE W REFLEX MICROSCOPIC
Bilirubin Urine: NEGATIVE
GLUCOSE, UA: NEGATIVE mg/dL
Hgb urine dipstick: NEGATIVE
Ketones, ur: NEGATIVE mg/dL
LEUKOCYTES UA: NEGATIVE
Nitrite: NEGATIVE
PH: 6 (ref 5.0–8.0)
Protein, ur: NEGATIVE mg/dL
SPECIFIC GRAVITY, URINE: 1.025 (ref 1.005–1.030)

## 2016-08-18 LAB — CBC WITH DIFFERENTIAL/PLATELET
Basophils Absolute: 0 10*3/uL (ref 0.0–0.1)
Basophils Relative: 0 %
EOS ABS: 0 10*3/uL (ref 0.0–0.7)
EOS PCT: 0 %
HCT: 31 % — ABNORMAL LOW (ref 36.0–46.0)
HEMOGLOBIN: 11.3 g/dL — AB (ref 12.0–15.0)
Lymphocytes Relative: 29 %
Lymphs Abs: 2 10*3/uL (ref 0.7–4.0)
MCH: 32.1 pg (ref 26.0–34.0)
MCHC: 36.5 g/dL — AB (ref 30.0–36.0)
MCV: 88.1 fL (ref 78.0–100.0)
Monocytes Absolute: 0.4 10*3/uL (ref 0.1–1.0)
Monocytes Relative: 6 %
NEUTROS PCT: 65 %
Neutro Abs: 4.5 10*3/uL (ref 1.7–7.7)
Platelets: 150 10*3/uL (ref 150–400)
RBC: 3.52 MIL/uL — AB (ref 3.87–5.11)
RDW: 13.4 % (ref 11.5–15.5)
WBC: 7 10*3/uL (ref 4.0–10.5)

## 2016-08-18 LAB — BASIC METABOLIC PANEL
Anion gap: 6 (ref 5–15)
BUN: 10 mg/dL (ref 6–20)
CHLORIDE: 103 mmol/L (ref 101–111)
CO2: 24 mmol/L (ref 22–32)
CREATININE: 0.57 mg/dL (ref 0.44–1.00)
Calcium: 8.8 mg/dL — ABNORMAL LOW (ref 8.9–10.3)
Glucose, Bld: 90 mg/dL (ref 65–99)
POTASSIUM: 3.7 mmol/L (ref 3.5–5.1)
SODIUM: 133 mmol/L — AB (ref 135–145)

## 2016-08-18 LAB — WET PREP, GENITAL
CLUE CELLS WET PREP: NONE SEEN
SPERM: NONE SEEN
TRICH WET PREP: NONE SEEN
Yeast Wet Prep HPF POC: NONE SEEN

## 2016-08-18 MED ORDER — CYCLOBENZAPRINE HCL 5 MG PO TABS
5.0000 mg | ORAL_TABLET | Freq: Once | ORAL | Status: AC
Start: 2016-08-18 — End: 2016-08-18
  Administered 2016-08-18: 5 mg via ORAL
  Filled 2016-08-18: qty 1

## 2016-08-18 MED ORDER — DICYCLOMINE HCL 10 MG PO CAPS
10.0000 mg | ORAL_CAPSULE | Freq: Once | ORAL | Status: AC
  Administered 2016-08-18: 10 mg via ORAL
  Filled 2016-08-18: qty 1

## 2016-08-18 NOTE — MAU Note (Signed)
Warm compress given for lower abd. And lower back.

## 2016-08-18 NOTE — MAU Note (Signed)
Pt states she woke up with lower back pain, also lower abd cramping.  Feels like she still has to urinate after voiding.  Denies dysuria but has pressure.  Denies vaginal bleeding.  No fever, vomiting or diarrhea.  Has some nausea.

## 2016-08-18 NOTE — Discharge Instructions (Signed)
Abdominal Pain During Pregnancy Belly (abdominal) pain is common during pregnancy. Most of the time, it is not a serious problem. Other times, it can be a sign that something is wrong with the pregnancy. Always tell your doctor if you have belly pain. Follow these instructions at home: Monitor your belly pain for any changes. The following actions may help you feel better:  Do not have sex (intercourse) or put anything in your vagina until you feel better.  Rest until your pain stops.  Drink clear fluids if you feel sick to your stomach (nauseous). Do not eat solid food until you feel better.  Only take medicine as told by your doctor.  Keep all doctor visits as told. Get help right away if:  You are bleeding, leaking fluid, or pieces of tissue come out of your vagina.  You have more pain or cramping.  You keep throwing up (vomiting).  You have pain when you pee (urinate) or have blood in your pee.  You have a fever.  You do not feel your baby moving as much.  You feel very weak or feel like passing out.  You have trouble breathing, with or without belly pain.  You have a very bad headache and belly pain.  You have fluid leaking from your vagina and belly pain.  You keep having watery poop (diarrhea).  Your belly pain does not go away after resting, or the pain gets worse. This information is not intended to replace advice given to you by your health care provider. Make sure you discuss any questions you have with your health care provider. Document Released: 04/01/2009 Document Revised: 11/20/2015 Document Reviewed: 11/10/2012 Elsevier Interactive Patient Education  2017 [employer].   Food Choices to Help Relieve Diarrhea, Adult When you have diarrhea, the foods you eat and your eating habits are very important. Choosing the right foods and drinks can help:  Relieve diarrhea.  Replace lost fluids and nutrients.  Prevent dehydration. What general guidelines  should I follow? Relieving diarrhea   Choose foods with less than 2 g or .07 oz. of fiber per serving.  Limit fats to less than 8 tsp (38 g or 1.34 oz.) a day.  Avoid the following:  Foods and beverages sweetened with high-fructose corn syrup, honey, or sugar alcohols such as xylitol, sorbitol, and mannitol.  Foods that contain a lot of fat or sugar.  Fried, greasy, or spicy foods.  High-fiber grains, breads, and cereals.  Raw fruits and vegetables.  Eat foods that are rich in probiotics. These foods include dairy products such as yogurt and fermented milk products. They help increase healthy bacteria in the stomach and intestines (gastrointestinal tract, or GI tract).  If you have lactose intolerance, avoid dairy products. These may make your diarrhea worse.  Take medicine to help stop diarrhea (antidiarrheal medicine) only as told by your health care provider. Replacing nutrients   Eat small meals or snacks every 3-4 hours.  Eat bland foods, such as white rice, toast, or baked potato, until your diarrhea starts to get better. Gradually reintroduce nutrient-rich foods as tolerated or as told by your health care provider. This includes:  Well-cooked protein foods.  Peeled, seeded, and soft-cooked fruits and vegetables.  Low-fat dairy products.  Take vitamin and mineral supplements as told by your health care provider. Preventing dehydration    Start by sipping water or a special solution to prevent dehydration (oral rehydration solution, ORS). Urine that is clear or pale yellow means that  you are getting enough fluid.  Try to drink at least 8-10 cups of fluid each day to help replace lost fluids.  You may add other liquids in addition to water, such as clear juice or decaffeinated sports drinks, as tolerated or as told by your health care provider.  Avoid drinks with caffeine, such as coffee, tea, or soft drinks.  Avoid alcohol. What foods are recommended? The items  listed may not be a complete list. Talk with your health care provider about what dietary choices are best for you. Grains  White rice. White, [place], or pita breads (fresh or toasted), including plain rolls, buns, or bagels. White pasta. Saltine, soda, or graham crackers. Pretzels. Low-fiber cereal. Cooked cereals made with water (such as cornmeal, farina, or cream cereals). Plain muffins. Matzo. Melba toast. Zwieback. Vegetables  Potatoes (without the skin). Most well-cooked and canned vegetables without skins or seeds. Tender lettuce. Fruits  Apple sauce. Fruits canned in juice. Cooked apricots, cherries, grapefruit, peaches, pears, or plums. Fresh bananas and cantaloupe. Meats and other protein foods  Baked or boiled chicken. Eggs. Tofu. Fish. Seafood. Smooth nut butters. Ground or well-cooked tender beef, ham, veal, lamb, pork, or poultry. Dairy  Plain yogurt, kefir, and unsweetened liquid yogurt. Lactose-free milk, buttermilk, skim milk, or soy milk. Low-fat or nonfat hard cheese. Beverages  Water. Low-calorie sports drinks. Fruit juices without pulp. Strained tomato and vegetable juices. Decaffeinated teas. Sugar-free beverages not sweetened with sugar alcohols. Oral rehydration solutions, if approved by your health care provider. Seasoning and other foods  Bouillon, broth, or soups made from recommended foods. What foods are not recommended? The items listed may not be a complete list. Talk with your health care provider about what dietary choices are best for you. Grains  Whole grain, whole wheat, bran, or rye breads, rolls, pastas, and crackers. Wild or brown rice. Whole grain or bran cereals. Barley. Oats and oatmeal. Corn tortillas or taco shells. Granola. Popcorn. Vegetables  Raw vegetables. Fried vegetables. Cabbage, broccoli, Brussels sprouts, artichokes, baked beans, beet greens, corn, kale, legumes, peas, sweet potatoes, and yams. Potato skins. Cooked spinach and cabbage. Fruits   Dried fruit, including raisins and dates. Raw fruits. Stewed or dried prunes. Canned fruits with syrup. Meat and other protein foods  Fried or fatty meats. Deli meats. Chunky nut butters. Nuts and seeds. Beans and lentils. Tomasa Blase. Hot dogs. Sausage. Dairy  High-fat cheeses. Whole milk, chocolate milk, and beverages made with milk, such as milk shakes. Half-and-half. Cream. sour cream. Ice cream. Beverages  Caffeinated beverages (such as coffee, tea, soda, or energy drinks). Alcoholic beverages. Fruit juices with pulp. Prune juice. Soft drinks sweetened with high-fructose corn syrup or sugar alcohols. High-calorie sports drinks. Fats and oils  Butter. Cream sauces. Margarine. Salad oils. Plain salad dressings. Olives. Avocados. Mayonnaise. Sweets and desserts  Sweet rolls, doughnuts, and sweet breads. Sugar-free desserts sweetened with sugar alcohols such as xylitol and sorbitol. Seasoning and other foods  Honey. Hot sauce. Chili powder. Gravy. Cream-based or milk-based soups. Pancakes and waffles. Summary  When you have diarrhea, the foods you eat and your eating habits are very important.  Make sure you get at least 8-10 cups of fluid each day, or enough to keep your urine clear or pale yellow.  Eat bland foods and gradually reintroduce healthy, nutrient-rich foods as tolerated, or as told by your health care provider.  Avoid high-fiber, fried, greasy, or spicy foods. This information is not intended to replace advice given to you by your  health care provider. Make sure you discuss any questions you have with your health care provider. Document Released: 07/04/2003 Document Revised: 04/10/2016 Document Reviewed: 04/10/2016 Elsevier Interactive Patient Education  2017 [employer].

## 2016-08-18 NOTE — MAU Provider Note (Signed)
Chief Complaint: Back Pain; Abdominal Pain; and Urinary Frequency   SUBJECTIVE HPI: Tanya Bailey is a 31 y.o. [id number] at [redacted]w[redacted]d who presents to Maternity Admissions reporting abdominal cramping, back pain, and urinary frequency.  Woke this morning with low back pain. Constant pain, described as dull and achy, points to the SI joint. Tried heating pads, warm bath, stretching, position changes. Tried a nap, when she woke she had abdominal cramping, lower abdomen that is constant R>L, similar to menstrual cramp. Denies any VB or abnormal vaginal discharge. She also notes today she has to frequently urinate, about 20-30 minutes, but occasionally just dribbling. +urgency, and incomplete voiding. Denies dysuria.     Past Medical History:  Diagnosis Date  . Abnormal Pap smear   . Chlamydia   . Ectopic pregnancy   . Heart murmur    childhood  . Preterm labor    OB History  Gravida Para Term Preterm AB Living  SAB TAB Ectopic Multiple Live Births  0 0 1 0 5    # Outcome Date GA Lbr Len/2nd Weight Sex Delivery Anes PTL Lv  7 Current           6 Term 02/11/14 [redacted]w[redacted]d 04:58 / 00:06 6 lb 12.3 oz (3.07 kg) F Vag-Spont EPI  LIV  5 Ectopic           4 Preterm     M Vag-Spont None  LIV     Birth Comments: 36 weeks  3 Preterm     F Vag-Spont None  LIV     Birth Comments: 35 weeks  2 Preterm     M Vag-Spont None  DEC     Birth Comments: 23 weeks, contractions and water broke.  1 Term      Vag-Spont None  LIV     Past Surgical History:  Procedure Laterality Date  . TOOTH EXTRACTION     Social History   Social History  . Marital status: Married    Spouse name: N/A  . Number of children: N/A  . Years of education: N/A   Occupational History  . Not on file.   Social History Main Topics  . Smoking status: Never Smoker  . Smokeless tobacco: Never Used  . Alcohol use No     Comment: Special occas. when not pregnant  . Drug use: No  . Sexual activity: Yes   Other  Topics Concern  . Not on file   Social History Narrative  . No narrative on file   No current facility-administered medications on file prior to encounter.    Current Outpatient Prescriptions on File Prior to Encounter  Medication Sig Dispense Refill  . Prenatal Vit-Fe Fumarate-FA (PRENATAL MULTIVITAMIN) TABS tablet Take 1 tablet by mouth daily at 12 noon.     No Known Allergies  I have reviewed the past Medical Hx, Surgical Hx, Social Hx, Allergies and Medications.   REVIEW OF SYSTEMS  A comprehensive ROS was negative except per HPI.   OBJECTIVE Patient Vitals for the past 24 hrs:  BP Temp Temp src Pulse Resp SpO2 Height Weight  08/18/16 2031 (!) 97/51 - - 70 17 - - -  08/18/16 1906 (!) 94/49 98.8 F (37.1 C) Oral 68 17 - - -  08/18/16 1713 110/65 97.9 F (36.6 C) Oral 78 20 100 %  (1.727 m) 200 lb (90.7 kg)    PHYSICAL EXAM Constitutional: Well-developed, well-nourished female in no acute distress.  Cardiovascular: normal rate, rhythm, no murmurs Respiratory: normal rate and effort. CTAB GI: Abd soft, RLQ TTP without guarding, non-distended. Pos BS x 4 MS: Extremities nontender, no edema, normal ROM; Tenderness to SI join in back Neurologic: Alert and oriented x 4.  GU: Neg CVAT. SPECULUM EXAM: NEFG, physiologic discharge, no blood noted, cervix clean BIMANUAL: cervix closed/long/high; uterus normal size, no adnexal tenderness or masses. Tenderness to RLQ/pelvis. No CMT.  LAB RESULTS Results for orders placed or performed during the hospital encounter of 08/18/16 (from the past 24 hour(s))  Urinalysis, Routine w reflex microscopic     Status: None   Collection Time: 08/18/16  5:20 PM  Result Value Ref Range   Color, Urine YELLOW YELLOW   APPearance CLEAR CLEAR   Specific Gravity, Urine 1.025 1.005 - 1.030   pH 6.0 5.0 - 8.0   Glucose, UA NEGATIVE NEGATIVE mg/dL   Hgb urine dipstick NEGATIVE NEGATIVE   Bilirubin Urine NEGATIVE NEGATIVE   Ketones, ur NEGATIVE  NEGATIVE mg/dL   Protein, ur NEGATIVE NEGATIVE mg/dL   Nitrite NEGATIVE NEGATIVE   Leukocytes, UA NEGATIVE NEGATIVE  Wet prep, genital     Status: Abnormal   Collection Time: 08/18/16  6:35 PM  Result Value Ref Range   Yeast Wet Prep HPF POC NONE SEEN NONE SEEN   Trich, Wet Prep NONE SEEN NONE SEEN   Clue Cells Wet Prep HPF POC NONE SEEN NONE SEEN   WBC, Wet Prep HPF POC FEW (A) NONE SEEN   Sperm NONE SEEN   CBC with Differential/Platelet     Status: Abnormal   Collection Time: 08/18/16  7:25 PM  Result Value Ref Range   WBC 7.0 4.0 - 10.5 K/uL   RBC 3.52 (L) 3.87 - 5.11 MIL/uL   Hemoglobin 11.3 (L) 12.0 - 15.0 g/dL   HCT 16.1 (L) 09.6 - 04.5 %   MCV 88.1 78.0 - 100.0 fL   MCH 32.1 26.0 - 34.0 pg   MCHC 36.5 (H) 30.0 - 36.0 g/dL   RDW 40.9 81.1 - 91.4 %   Platelets 150 150 - 400 K/uL   Neutrophils Relative % 65 %   Neutro Abs 4.5 1.7 - 7.7 K/uL   Lymphocytes Relative 29 %   Lymphs Abs 2.0 0.7 - 4.0 K/uL   Monocytes Relative 6 %   Monocytes Absolute 0.4 0.1 - 1.0 K/uL   Eosinophils Relative 0 %   Eosinophils Absolute 0.0 0.0 - 0.7 K/uL   Basophils Relative 0 %   Basophils Absolute 0.0 0.0 - 0.1 K/uL  Basic metabolic panel     Status: Abnormal   Collection Time: 08/18/16  7:25 PM  Result Value Ref Range   Sodium 133 (L) 135 - 145 mmol/L   Potassium 3.7 3.5 - 5.1 mmol/L   Chloride 103 101 - 111 mmol/L   CO2 24 22 - 32 mmol/L   Glucose, Bld 90 65 - 99 mg/dL   BUN 10 6 - 20 mg/dL   Creatinine, Ser 7.82 0.44 - 1.00 mg/dL   Calcium 8.8 (L) 8.9 - 10.3 mg/dL   GFR calc non Af Amer >60 >60 mL/min   GFR calc Af Amer >60 >60 mL/min   Anion gap 6 5 - 15    IMAGING No results found.  MAU COURSE CBC w/ diff - no infection BMP - no electrolyte abnormalities, kidney function normal UA - clean Wet prep - NEG   MDM Plan of care reviewed with patient, including labs and tests  ordered and medical treatment. Discussed likely an intestinal issue. Good FHT, cervix is  closed/thick/long/high, discussed this as reassuring to patient. Patient likely has gastroenteritis, likely early stages. Bleeding, pain precautions given for return to MAU/ED. Discussed to also return if fever develops, or pain gets worse.    ASSESSMENT 1. Abdominal pain affecting pregnancy   2. Gastroenteritis     PLAN Discharge home in stable condition. Supportive care, PO hydration Follow up in OB office for initial OB appt Bleeding precautions   Follow-up Information    [facility] [facility]. Schedule an appointment as soon as possible for a visit in 1 week(s).   Specialty:  Obstetrics and Gynecology Why:  Initial prenatal appointment Contact information: [address], [address] [place] [address] [phone]         Allergies as of 08/18/2016   No Known Allergies     Medication List    TAKE these medications   prenatal multivitamin Tabs tablet Take 1 tablet by mouth daily at 12 noon.        Jen Mow, DO OB Fellow 08/19/2016 12:22 AM

## 2016-08-18 NOTE — MAU Note (Signed)
Discharge instructions reviewed with patient, patient verbalized understanding, pt.'s SO at the [facility] to take her home.  Patient stable, pain 3/10, abd.; currently experiencing no back pain (per patient). Dr. Omer Jack at the bedside earlier to review labs and discharge instructions.

## 2016-08-31 ENCOUNTER — Other Ambulatory Visit (HOSPITAL_COMMUNITY)
Admission: RE | Admit: 2016-08-31 | Discharge: 2016-08-31 | Disposition: A | Discharge status: Home / Self Care | Payer: Medicaid Other | Source: Ambulatory Visit | Attending: Obstetrics | Admitting: Obstetrics

## 2016-08-31 ENCOUNTER — Ambulatory Visit (INDEPENDENT_AMBULATORY_CARE_PROVIDER_SITE_OTHER): Payer: Medicaid Other | Admitting: Obstetrics

## 2016-08-31 ENCOUNTER — Encounter: Payer: Self-pay | Admitting: Obstetrics

## 2016-08-31 VITALS — BP 116/66 | HR 87 | Wt 203.2 lb

## 2016-08-31 DIAGNOSIS — O26892 Other specified pregnancy related conditions, second trimester: Secondary | ICD-10-CM | POA: Diagnosis not present

## 2016-08-31 DIAGNOSIS — N898 Other specified noninflammatory disorders of vagina: Secondary | ICD-10-CM | POA: Diagnosis not present

## 2016-08-31 DIAGNOSIS — O0993 Supervision of high risk pregnancy, unspecified, third trimester: Secondary | ICD-10-CM | POA: Insufficient documentation

## 2016-08-31 DIAGNOSIS — Z124 Encounter for screening for malignant neoplasm of cervix: Secondary | ICD-10-CM | POA: Insufficient documentation

## 2016-08-31 DIAGNOSIS — O0990 Supervision of high risk pregnancy, unspecified, unspecified trimester: Secondary | ICD-10-CM

## 2016-08-31 DIAGNOSIS — Z3A15 15 weeks gestation of pregnancy: Secondary | ICD-10-CM | POA: Diagnosis not present

## 2016-08-31 DIAGNOSIS — Z3481 Encounter for supervision of other normal pregnancy, first trimester: Secondary | ICD-10-CM

## 2016-08-31 DIAGNOSIS — Z8759 Personal history of other complications of pregnancy, childbirth and the puerperium: Secondary | ICD-10-CM

## 2016-08-31 DIAGNOSIS — O0992 Supervision of high risk pregnancy, unspecified, second trimester: Secondary | ICD-10-CM

## 2016-08-31 DIAGNOSIS — O09212 Supervision of pregnancy with history of pre-term labor, second trimester: Secondary | ICD-10-CM

## 2016-08-31 HISTORY — DX: Supervision of high risk pregnancy, unspecified, third trimester: O09.93

## 2016-08-31 MED ORDER — TERCONAZOLE 0.8 % VA CREA: 1.0000 | TOPICAL_CREAM | Freq: Every day | VAGINAL | 0 refills | Status: DC

## 2016-08-31 NOTE — Progress Notes (Signed)
Subjective:    Tanya Bailey is being seen today for her first obstetrical visit.  This is not a planned pregnancy. She is at [redacted]w[redacted]d gestation. Her obstetrical history is significant for previous preterm deliveries. Relationship with FOB: significant other, living together. Patient does intend to breast feed. Pregnancy history fully reviewed.  The information documented in the HPI was reviewed and verified.  Menstrual History: OB History    Gravida Para Term Preterm AB Living   7 5 2 3 1 4    SAB TAB Ectopic Multiple Live Births   0 0 1 0 5      Patient's last menstrual period was 05/15/2016.    Past Medical History:  Diagnosis Date  . Abnormal Pap smear   . Chlamydia   . Ectopic pregnancy   . Heart murmur    childhood  . Preterm labor     Past Surgical History:  Procedure Laterality Date  . TOOTH EXTRACTION       (Not in a hospital admission) No Known Allergies  Social History  Substance Use Topics  . Smoking status: Never Smoker  . Smokeless tobacco: Never Used  . Alcohol use No     Comment: Special occas. when not pregnant    Family History  Problem Relation Age of Onset  . Cancer Maternal Grandfather   . Prostate cancer Paternal Grandfather   . Anesthesia problems Neg Hx   . Alcohol abuse Neg Hx   . Arthritis Neg Hx   . Asthma Neg Hx   . Birth defects Neg Hx   . COPD Neg Hx   . Depression Neg Hx   . Diabetes Neg Hx   . Drug abuse Neg Hx   . Early death Neg Hx   . Hearing loss Neg Hx   . Heart disease Neg Hx   . Hyperlipidemia Neg Hx   . Hypertension Neg Hx   . Kidney disease Neg Hx   . Learning disabilities Neg Hx   . Mental illness Neg Hx   . Mental retardation Neg Hx   . Miscarriages / Stillbirths Neg Hx   . Stroke Neg Hx   . Vision loss Neg Hx   . Other Neg Hx      Review of Systems Constitutional: negative for weight loss Gastrointestinal: negative for vomiting Genitourinary:negative for genital lesions and vaginal discharge and  dysuria Musculoskeletal:negative for back pain Behavioral/Psych: negative for abusive relationship, depression, illegal drug usage and tobacco use    Objective:    BP 116/66   Pulse 87   Wt 203 lb 3.2 oz (92.2 kg)   LMP 05/15/2016   BMI 30.90 kg/m  General Appearance:    Alert, cooperative, no distress, appears stated age  Head:    Normocephalic, without obvious abnormality, atraumatic  Eyes:    PERRL, conjunctiva/corneas clear, EOM's intact, fundi    benign, both eyes  Ears:    Normal TM's and external ear canals, both ears  Nose:   Nares normal, septum midline, mucosa normal, no drainage    or sinus tenderness  Throat:   Lips, mucosa, and tongue normal; teeth and gums normal  Neck:   Supple, symmetrical, trachea midline, no adenopathy;    thyroid:  no enlargement/tenderness/nodules; no carotid   bruit or JVD  Back:     Symmetric, no curvature, ROM normal, no CVA tenderness  Lungs:     Clear to auscultation bilaterally, respirations unlabored  Chest Wall:    No tenderness or deformity  Heart:    Regular rate and rhythm, S1 and S2 normal, no murmur, rub   or gallop  Breast Exam:    No tenderness, masses, or nipple abnormality  Abdomen:     Soft, non-tender, bowel sounds active all four quadrants,    no masses, no organomegaly  Genitalia:    Normal female without lesion, discharge or tenderness  Extremities:   Extremities normal, atraumatic, no cyanosis or edema  Pulses:   2+ and symmetric all extremities  Skin:   Skin color, texture, turgor normal, no rashes or lesions  Lymph nodes:   Cervical, supraclavicular, and axillary nodes normal  Neurologic:   CNII-XII intact, normal strength, sensation and reflexes    throughout      Lab Review Urine pregnancy test Labs reviewed yes Radiologic studies reviewed no  Assessment:    Pregnancy at 4125w3d weeks   Previous preterm deliveries.  Term delivery last pregnancy, on 17- P weekly injections starting in 2nd trimester   Plan:      1. Supervision of high risk pregnancy, antepartum Rx: - Obstetric Panel, Including HIV - Hemoglobinopathy evaluation - Culture, OB Urine - AFP, Quad Screen - AMB referral to maternal fetal medicine - US MFM OB COMP + 14 WK; Future  2. History of stillbirth   3. Vaginal yeast infection Rx: - Cervicovaginal ancillary only -Terazol 3 Rx  4. Encounter for Papanicolaou smear for cervical cancer screening Rx: - Cytology - PAP   Referred to MFM for consultation and ultrasound 17-P to be started at [redacted] weeks gestation.  Received 17- P last pregnancy and had term delivery. 17- P Rx  Prenatal vitamins.  Counseling provided regarding continued use of seat belts, cessation of alcohol consumption, smoking or use of illicit drugs; infection precautions i.e., influenza/TDAP immunizations, toxoplasmosis,CMV, parvovirus, listeria and varicella; workplace safety, exercise during pregnancy; routine dental care, safe medications, sexual activity, hot tubs, saunas, pools, travel, caffeine use, fish and methlymercury, potential toxins, hair treatments, varicose veins Weight gain recommendations per IOM guidelines reviewed: underweight/BMI< 18.5--> gain 28 - 40 lbs; normal weight/BMI 18.5 - 24.9--> gain 25 - 35 lbs; overweight/BMI 25 - 29.9--> gain 15 - 25 lbs; obese/BMI >30->gain  11 - 20 lbs Problem list reviewed and updated. FIRST/CF mutation testing/NIPT/QUAD SCREEN/fragile X/Ashkenazi Jewish population testing/Spinal muscular atrophy discussed: requested. Role of ultrasound in pregnancy discussed; fetal survey: requested. Amniocentesis discussed: not indicated. VBAC calculator score: VBAC consent form provided   Meds ordered this encounter  Medications  . Prenatal Vit-Fe Fumarate-FA (NAT-RUL PRENATAL VITAMINS) 28-0.8 MG TABS    Sig: Take by mouth.  . terconazole (TERAZOL 3) 0.8 % vaginal cream    Sig: Place 1 applicator vaginally at bedtime.    Dispense:  20 g    Refill:  0   Orders  Placed This Encounter  Procedures  . Culture, OB Urine  . US MFM OB COMP + 14 WK    Standing Status:   Future    Standing Expiration Date:   10/31/2017    Order Specific Question:   Reason for Exam (SYMPTOM  OR DIAGNOSIS REQUIRED)    Answer:   Previous preterm deliveries.    Order Specific Question:   Preferred imaging location?    Answer:   [facility]  . Obstetric Panel, Including HIV  . Hemoglobinopathy evaluation  . AFP, Quad Screen    Order Specific Question:   Repeat Sample    Answer:   No    Order Specific Question:   Maternal Race  Answer:   African American    Order Specific Question:   EDD    Answer:   02/19/2017    Order Specific Question:   Gest Age at U/S (Wk.Dy)    Answer:   [redacted]w[redacted]d    Order Specific Question:   LMP:    Answer:   05/15/2016    Order Specific Question:   Number of Fetuses    Answer:   1    Order Specific Question:   Ultrasound Date    Answer:   06/25/2016    Order Specific Question:   Hx of OSB/NTD?    Answer:   No    Order Specific Question:   History of Down Syndrome?    Answer:   No    Order Specific Question:   Maternal IDDM (insulin-dependent diabetes mellitus)    Answer:   No    Order Specific Question:   Maternal Weight (lbs)    Answer:   203  . AMB referral to maternal fetal medicine    Referral Priority:   Routine    Referral Type:   Consultation    Referral Reason:   Specialty Services Required    Number of Visits Requested:   1    Follow up in 4 weeks for prenatal visit.  Will receive weekly 17- P injections starting at 16 weeks, until 37 weeks 50% of 20 min visit spent on counseling and coordination of care.                                                                                                                                                                                                                               Patient ID: Tanya Bailey, female   DOB: [birth year], 31 y.o.   MRN: [id number] Patient ID: Tanya Bailey, female   DOB: Aug 21, 1985, 31 y.o.   MRN: [id number]

## 2016-09-01 LAB — CYTOLOGY - PAP
Diagnosis: NEGATIVE
HPV (WINDOPATH): NOT DETECTED

## 2016-09-02 ENCOUNTER — Other Ambulatory Visit: Payer: Self-pay | Admitting: Obstetrics

## 2016-09-02 LAB — OBSTETRIC PANEL, INCLUDING HIV
Antibody Screen: NEGATIVE
BASOS ABS: 0 10*3/uL (ref 0.0–0.2)
Basos: 0 %
EOS (ABSOLUTE): 0.1 10*3/uL (ref 0.0–0.4)
Eos: 1 %
HEP B S AG: NEGATIVE
HIV Screen 4th Generation wRfx: NONREACTIVE
Hematocrit: 35.4 % (ref 34.0–46.6)
Hemoglobin: 12.2 g/dL (ref 11.1–15.9)
IMMATURE GRANULOCYTES: 1 %
Immature Grans (Abs): 0.1 10*3/uL (ref 0.0–0.1)
LYMPHS ABS: 1.5 10*3/uL (ref 0.7–3.1)
Lymphs: 21 %
MCH: 31.2 pg (ref 26.6–33.0)
MCHC: 34.5 g/dL (ref 31.5–35.7)
MCV: 91 fL (ref 79–97)
MONOCYTES: 7 %
Monocytes Absolute: 0.5 10*3/uL (ref 0.1–0.9)
NEUTROS ABS: 5 10*3/uL (ref 1.4–7.0)
NEUTROS PCT: 70 %
PLATELETS: 163 10*3/uL (ref 150–379)
RBC: 3.91 x10E6/uL (ref 3.77–5.28)
RDW: 14.7 % (ref 12.3–15.4)
RPR: NONREACTIVE
RUBELLA: 2.03 {index} (ref >0.99)
Rh Factor: POSITIVE
WBC: 7.1 10*3/uL (ref 3.4–10.8)

## 2016-09-02 LAB — HEMOGLOBINOPATHY EVALUATION
HEMOGLOBIN F QUANTITATION: 0 % (ref 0.0–2.0)
HGB A: 97.2 % (ref 96.4–98.8)
HGB C: 0 %
HGB S: 0 %
HGB VARIANT: 0 %
Hemoglobin A2 Quantitation: 2.8 % (ref 1.8–3.2)

## 2016-09-02 LAB — CULTURE, OB URINE

## 2016-09-02 LAB — CERVICOVAGINAL ANCILLARY ONLY
BACTERIAL VAGINITIS: NEGATIVE
CANDIDA VAGINITIS: POSITIVE — AB
Chlamydia: NEGATIVE
NEISSERIA GONORRHEA: NEGATIVE
TRICH (WINDOWPATH): NEGATIVE

## 2016-09-02 LAB — URINE CULTURE, OB REFLEX

## 2016-09-03 ENCOUNTER — Other Ambulatory Visit: Payer: Self-pay | Admitting: Obstetrics

## 2016-09-03 LAB — AFP, QUAD SCREEN
DIA MOM VALUE: 0.59
DIA VALUE (EIA): 87.67 pg/mL
DSR (BY AGE) 1 IN: 553
DSR (Second Trimester) 1 IN: 10000
Gestational Age: 15.4 WEEKS
MATERNAL AGE AT EDD: 31.7 a
MSAFP Mom: 0.98
MSAFP: 26.6 ng/mL
MSHCG Mom: 1.58
MSHCG: 59377 m[IU]/mL
Osb Risk: 10000
Test Results:: NEGATIVE
UE3 VALUE: 0.66 ng/mL
Weight: 203 [lb_av]
uE3 Mom: 1.13

## 2016-09-07 ENCOUNTER — Ambulatory Visit (INDEPENDENT_AMBULATORY_CARE_PROVIDER_SITE_OTHER): Payer: Medicaid Other | Admitting: *Deleted

## 2016-09-07 VITALS — BP 111/69 | HR 105 | Wt 202.6 lb

## 2016-09-07 DIAGNOSIS — O09212 Supervision of pregnancy with history of pre-term labor, second trimester: Secondary | ICD-10-CM | POA: Diagnosis not present

## 2016-09-07 DIAGNOSIS — O0990 Supervision of high risk pregnancy, unspecified, unspecified trimester: Secondary | ICD-10-CM

## 2016-09-07 MED ORDER — HYDROXYPROGESTERONE CAPROATE 275 MG/1.1ML ~~LOC~~ SOAJ: 275.0000 mg | Freq: Once | SUBCUTANEOUS | Status: DC

## 2016-09-07 MED ORDER — HYDROXYPROGESTERONE CAPROATE 250 MG/ML IM OIL
250.0000 mg | TOPICAL_OIL | Freq: Once | INTRAMUSCULAR | Status: AC
  Administered 2016-09-07: 250 mg via INTRAMUSCULAR

## 2016-09-14 ENCOUNTER — Ambulatory Visit: Payer: Medicaid Other

## 2016-09-22 ENCOUNTER — Ambulatory Visit (HOSPITAL_COMMUNITY): Payer: Medicaid Other

## 2016-09-22 ENCOUNTER — Ambulatory Visit: Payer: Medicaid Other

## 2016-09-28 ENCOUNTER — Ambulatory Visit (INDEPENDENT_AMBULATORY_CARE_PROVIDER_SITE_OTHER): Payer: Medicaid Other | Admitting: Obstetrics & Gynecology

## 2016-09-28 VITALS — BP 114/74 | HR 76 | Wt 208.0 lb

## 2016-09-28 DIAGNOSIS — O09212 Supervision of pregnancy with history of pre-term labor, second trimester: Secondary | ICD-10-CM

## 2016-09-28 DIAGNOSIS — Z3480 Encounter for supervision of other normal pregnancy, unspecified trimester: Secondary | ICD-10-CM

## 2016-09-28 DIAGNOSIS — Z3482 Encounter for supervision of other normal pregnancy, second trimester: Secondary | ICD-10-CM

## 2016-09-28 MED ORDER — HYDROXYPROGESTERONE CAPROATE 250 MG/ML IM OIL
250.0000 mg | TOPICAL_OIL | Freq: Once | INTRAMUSCULAR | Status: AC
  Administered 2016-09-28: 250 mg via INTRAMUSCULAR

## 2016-09-28 NOTE — Progress Notes (Signed)
   PRENATAL VISIT NOTE  Subjective:  Tanya Bailey is a 31 y.o. [phone] at 3133w3d being seen today for ongoing prenatal care.  She is currently monitored for the following issues for this high-risk pregnancy and has Supervision of other normal pregnancy, antepartum on her problem list.  Patient reports no complaints.  Contractions: Not present. Vag. Bleeding: None.  Movement: Present. Denies leaking of fluid.   The following portions of the patient's history were reviewed and updated as appropriate: allergies, current medications, past family history, past medical history, past social history, past surgical history and problem list. Problem list updated.  Objective:   Vitals:   09/28/16 1000  BP: 114/74  Pulse: 76  Weight: 94.3 kg (208 lb)    Fetal Status: Fetal Heart Rate (bpm): 149   Movement: Present     General:  Alert, oriented and cooperative. Patient is in no acute distress.  Skin: Skin is warm and dry. No rash noted.   Cardiovascular: Normal heart rate noted  Respiratory: Normal respiratory effort, no problems with respiration noted  Abdomen: Soft, gravid, appropriate for gestational age. Pain/Pressure: Absent     Pelvic:  Cervical exam deferred        Extremities: Normal range of motion.  Edema: None  Mental Status: Normal mood and affect. Normal behavior. Normal judgment and thought content.   Assessment and Plan:  Pregnancy: [id number] at 3133w3d  1. Supervision of other normal pregnancy, antepartum H/O PTB - hydroxyprogesterone caproate (MAKENA) 250 mg/mL injection 250 mg; Inject 1 mL (250 mg total) into the muscle once.  Preterm labor symptoms and general obstetric precautions including but not limited to vaginal bleeding, contractions, leaking of fluid and fetal movement were reviewed in detail with the patient. Please refer to After Visit Summary for other counseling recommendations.  4 week return  And 17 p weekly  Scheryl DarterJames Cayton Cuevas, MD

## 2016-09-28 NOTE — Patient Instructions (Signed)

## 2016-09-29 ENCOUNTER — Other Ambulatory Visit: Payer: Self-pay | Admitting: Obstetrics

## 2016-09-29 ENCOUNTER — Ambulatory Visit (HOSPITAL_COMMUNITY)
Admission: RE | Admit: 2016-09-29 | Discharge: 2016-09-29 | Disposition: A | Discharge status: Home / Self Care | Payer: Medicaid Other | Source: Ambulatory Visit | Attending: Obstetrics | Admitting: Obstetrics

## 2016-09-29 ENCOUNTER — Other Ambulatory Visit (HOSPITAL_COMMUNITY): Payer: Self-pay | Admitting: *Deleted

## 2016-09-29 ENCOUNTER — Encounter (HOSPITAL_COMMUNITY): Payer: Self-pay

## 2016-09-29 DIAGNOSIS — Z3A19 19 weeks gestation of pregnancy: Secondary | ICD-10-CM

## 2016-09-29 DIAGNOSIS — O0990 Supervision of high risk pregnancy, unspecified, unspecified trimester: Secondary | ICD-10-CM

## 2016-09-29 DIAGNOSIS — O0992 Supervision of high risk pregnancy, unspecified, second trimester: Secondary | ICD-10-CM | POA: Diagnosis not present

## 2016-09-29 DIAGNOSIS — O09299 Supervision of pregnancy with other poor reproductive or obstetric history, unspecified trimester: Secondary | ICD-10-CM

## 2016-09-29 DIAGNOSIS — Z363 Encounter for antenatal screening for malformations: Secondary | ICD-10-CM | POA: Insufficient documentation

## 2016-09-29 DIAGNOSIS — O09219 Supervision of pregnancy with history of pre-term labor, unspecified trimester: Principal | ICD-10-CM

## 2016-09-29 DIAGNOSIS — O09899 Supervision of other high risk pregnancies, unspecified trimester: Secondary | ICD-10-CM

## 2016-09-29 NOTE — Progress Notes (Signed)
MFM consultation, staff Note:   I reviewed with the patient the pathophysiology of preterm delivery in pregnancy and that often the etiology of the preterm delivery is not ascertained from the clinical scenario. In this patient's case she has experienced spontaneous preterm deliveries at 7823, 35 and [redacted] weeks gestation.?  Infection may be a cause of preterm labor and preterm delivery. Advanced cervical dilation can also lead to infection; therefore, there are 2 mechanisms that could be contributing to the preterm delivery. Nonetheless the recurrence for spontaneous preterm birth is 2 times higher than the general population in women who experience a prior preterm delivery and the actual risk can be in the range of 50% to 60%. The risk of preterm delivery increases with each prior preterm birth. The most recent birth is the most predictive of risk for future preterm birth, which was demonstrated in the [place] study by Kerr-McGeeBakketeig et al in 1981. The risk increases further as the gestational age of the index preterm birth declines, especially with gestational age before 7332 weeks. If the 1st preterm birth occurred before 32 weeks the risk of recurrent preterm birth is 28%. The etiology of recurrent preterm delivery is often not clear but has been related to shortened cervix, infection and short inter-pregnancy interval.  I did remind the patient that there is no treatment for preterm delivery, but there are ways to follow the next pregnancy in order to make appropriate interventions.  The cervical length should be measured via transvaginal ultrasound at the anatomy scan, which we performed today.  Cervical length was reassuring at 4.5cm, conferring low risk for preterm delivery.  That being said, cervical length assessment at 22-23  6/7 weeks has been shown to be most predictive. If the cervical length is measured at 25-29 mm (low normal/borderline), then we will advise performing screening measurement every  other week until 28-0/7 weeks. If at any point up to 22 weeks the cervix measures less than 25 mm, we would then recommend a follow up maternal fetal medicine consultation with the patient regarding the need for supplemental vaginal progesterone and more close surveillance depending on actual length. Again, this would be a discussion that would need to be undertaken at that time depending on the clinical scenario.  Given her cervix is long and closed (>3.0cm in closed length), we would recommend that the patient obtain another cervical length measurement between 22 and 24 weeks' gestational age as this is an ideal gestational age in order to assign the patient a risk assessment for recurrent preterm delivery in this pregnancy.  Hence this was scheduled today. ? I also advised the patient undergo weekly intramuscular injections of 17-hydroxyprogesterone caproate (17-OHP) from [redacted] wks GA until 36 weeks' gestational age. This is a once per week intramuscular injection which is supported by compelling scientific evidence to decrease the risk of recurrent preterm delivery in women with a prior history of preterm delivery. I did remind the patient that this is not a treatment for preterm delivery, but rather an intervention that may significantly reduce the recurrence of preterm delivery.  Summary of Recommendations:  1. Fetal survey today; see [facility]  2. The cervical length today is reassuring but should be reassessed at 22-23 6/7 wks for risk assessment.  A. If the cervical length is measured at 25-29 mm (low normal/borderline), then we will advise performing screening measurement every other week until 28-0/7 weeks.  B. If at any point up to 24 weeks the cervix measures less than 25  mm, we would then recommend a follow up maternal fetal medicine consultation with the patient regarding the need for supplemental vaginal progesterone and more close surveillance depending on actual length. Again, this would  be a discussion that would need to be undertaken at that time depending on the clinical scenario.  C. If cervix is long and closed (>3.0cm in closed length), there will not be a need for follow up cervical length unless clinically indicated.  3. I also advised the patient undergo weekly intramuscular injections of 17-hydroxyprogesterone caproate (17-OHP) from [redacted] wks GA until 36 weeks' gestational age.   Time Spent:  I spent in excess of 40 minutes in consultation with this patient to review records, evaluate her case, and provide her with an adequate discussion and education. More than 50% of this time was spent in direct face-to-face counseling.   It was a pleasure seeing your patient in the office today. Thank you for consultation. Please do not hesitate to contact our service for any further questions.  ?  Thank you,  ?  Louann Sjogren Denney  ?  Rogelia Boga, MD, MS, FACOG  Assistant Professor  Section of Maternal-Fetal Medicine  [facility]

## 2016-09-29 NOTE — Progress Notes (Signed)
MFM consultation, staff Note:   I reviewed with the patient the pathophysiology of preterm delivery in pregnancy and that often the etiology of the preterm delivery is not ascertained from the clinical scenario. In this patient's case she has experienced spontaneous preterm deliveries at 7823, 35 and [redacted] weeks gestation.?  Infection may be a cause of preterm labor and preterm delivery. Advanced cervical dilation can also lead to infection; therefore, there are 2 mechanisms that could be contributing to the preterm delivery. Nonetheless the recurrence for spontaneous preterm birth is 2 times higher than the general population in women who experience a prior preterm delivery and the actual risk can be in the range of 50% to 60%. The risk of preterm delivery increases with each prior preterm birth. The most recent birth is the most predictive of risk for future preterm birth, which was demonstrated in the [place] study by Kerr-McGeeBakketeig et al in 1981. The risk increases further as the gestational age of the index preterm birth declines, especially with gestational age before 7332 weeks. If the 1st preterm birth occurred before 32 weeks the risk of recurrent preterm birth is 28%. The etiology of recurrent preterm delivery is often not clear but has been related to shortened cervix, infection and short inter-pregnancy interval.  I did remind the patient that there is no treatment for preterm delivery, but there are ways to follow the next pregnancy in order to make appropriate interventions.  The cervical length should be measured via transvaginal ultrasound at the anatomy scan, which we performed today.  Cervical length was reassuring at 4.5cm, conferring low risk for preterm delivery.  That being said, cervical length assessment at 22-23  6/7 weeks has been shown to be most predictive. If the cervical length is measured at 25-29 mm (low normal/borderline), then we will advise performing screening measurement every  other week until 28-0/7 weeks. If at any point up to 22 weeks the cervix measures less than 25 mm, we would then recommend a follow up maternal fetal medicine consultation with the patient regarding the need for supplemental vaginal progesterone and more close surveillance depending on actual length. Again, this would be a discussion that would need to be undertaken at that time depending on the clinical scenario.  Given her cervix is long and closed (>3.0cm in closed length), we would recommend that the patient obtain another cervical length measurement between 22 and 24 weeks' gestational age as this is an ideal gestational age in order to assign the patient a risk assessment for recurrent preterm delivery in this pregnancy.  Hence this was scheduled today. ? I also advised the patient undergo weekly intramuscular injections of 17-hydroxyprogesterone caproate (17-OHP) from [redacted] wks GA until 36 weeks' gestational age. This is a once per week intramuscular injection which is supported by compelling scientific evidence to decrease the risk of recurrent preterm delivery in women with a prior history of preterm delivery. I did remind the patient that this is not a treatment for preterm delivery, but rather an intervention that may significantly reduce the recurrence of preterm delivery.  Summary of Recommendations:  1. Fetal survey today; see [facility]  2. The cervical length today is reassuring but should be reassessed at 22-23 6/7 wks for risk assessment.  A. If the cervical length is measured at 25-29 mm (low normal/borderline), then we will advise performing screening measurement every other week until 28-0/7 weeks.  B. If at any point up to 24 weeks the cervix measures less than 25  mm, we would then recommend a follow up maternal fetal medicine consultation with the patient regarding the need for supplemental vaginal progesterone and more close surveillance depending on actual length. Again, this would  be a discussion that would need to be undertaken at that time depending on the clinical scenario.  C. If cervix is long and closed (>3.0cm in closed length), there will not be a need for follow up cervical length unless clinically indicated.  3. I also advised the patient undergo weekly intramuscular injections of 17-hydroxyprogesterone caproate (17-OHP) from [redacted] wks GA until 36 weeks' gestational age.   Time Spent:  I spent in excess of 40 minutes in consultation with this patient to review records, evaluate her case, and provide her with an adequate discussion and education. More than 50% of this time was spent in direct face-to-face counseling.   It was a pleasure seeing your patient in the office today. Thank you for consultation. Please do not hesitate to contact our service for any further questions.  ?  Thank you,  ?  Tanya Bailey  ?  Tanya Boga, MD, MS, FACOG  Assistant Professor  Section of Maternal-Fetal Medicine  [facility]

## 2016-10-05 ENCOUNTER — Ambulatory Visit (INDEPENDENT_AMBULATORY_CARE_PROVIDER_SITE_OTHER): Payer: Medicaid Other

## 2016-10-05 VITALS — BP 103/67 | HR 75 | Wt 210.0 lb

## 2016-10-05 DIAGNOSIS — O09212 Supervision of pregnancy with history of pre-term labor, second trimester: Secondary | ICD-10-CM | POA: Diagnosis not present

## 2016-10-05 DIAGNOSIS — O09219 Supervision of pregnancy with history of pre-term labor, unspecified trimester: Secondary | ICD-10-CM

## 2016-10-05 MED ORDER — HYDROXYPROGESTERONE CAPROATE 250 MG/ML IM OIL
250.0000 mg | TOPICAL_OIL | Freq: Once | INTRAMUSCULAR | Status: AC
Start: 2016-10-05 — End: 2016-10-12
  Administered 2016-10-12: 250 mg via INTRAMUSCULAR

## 2016-10-05 NOTE — Progress Notes (Signed)
Pt presents for 17p injection. Pt tolerated well

## 2016-10-06 ENCOUNTER — Inpatient Hospital Stay (HOSPITAL_COMMUNITY): Payer: Medicaid Other

## 2016-10-06 ENCOUNTER — Encounter (HOSPITAL_COMMUNITY): Payer: Self-pay | Admitting: *Deleted

## 2016-10-06 ENCOUNTER — Inpatient Hospital Stay (HOSPITAL_COMMUNITY)
Admission: AD | Admit: 2016-10-06 | Discharge: 2016-10-06 | Disposition: A | Discharge status: Home / Self Care | Payer: Medicaid Other | Source: Ambulatory Visit | Attending: Obstetrics and Gynecology | Admitting: Obstetrics and Gynecology

## 2016-10-06 DIAGNOSIS — Z3A00 Weeks of gestation of pregnancy not specified: Secondary | ICD-10-CM | POA: Diagnosis not present

## 2016-10-06 DIAGNOSIS — B9689 Other specified bacterial agents as the cause of diseases classified elsewhere: Secondary | ICD-10-CM | POA: Diagnosis not present

## 2016-10-06 DIAGNOSIS — O4700 False labor before 37 completed weeks of gestation, unspecified trimester: Secondary | ICD-10-CM

## 2016-10-06 DIAGNOSIS — N760 Acute vaginitis: Secondary | ICD-10-CM | POA: Diagnosis not present

## 2016-10-06 DIAGNOSIS — B373 Candidiasis of vulva and vagina: Secondary | ICD-10-CM | POA: Diagnosis not present

## 2016-10-06 DIAGNOSIS — O26899 Other specified pregnancy related conditions, unspecified trimester: Secondary | ICD-10-CM | POA: Diagnosis not present

## 2016-10-06 DIAGNOSIS — R1030 Lower abdominal pain, unspecified: Secondary | ICD-10-CM | POA: Diagnosis present

## 2016-10-06 DIAGNOSIS — O9989 Other specified diseases and conditions complicating pregnancy, childbirth and the puerperium: Secondary | ICD-10-CM | POA: Diagnosis not present

## 2016-10-06 DIAGNOSIS — O479 False labor, unspecified: Secondary | ICD-10-CM

## 2016-10-06 DIAGNOSIS — B3731 Acute candidiasis of vulva and vagina: Secondary | ICD-10-CM

## 2016-10-06 LAB — URINALYSIS, ROUTINE W REFLEX MICROSCOPIC
BILIRUBIN URINE: NEGATIVE
GLUCOSE, UA: NEGATIVE mg/dL
HGB URINE DIPSTICK: NEGATIVE
KETONES UR: NEGATIVE mg/dL
NITRITE: NEGATIVE
PH: 7 (ref 5.0–8.0)
Protein, ur: NEGATIVE mg/dL
Specific Gravity, Urine: 1.021 (ref 1.005–1.030)

## 2016-10-06 LAB — WET PREP, GENITAL
Sperm: NONE SEEN
Trich, Wet Prep: NONE SEEN

## 2016-10-06 LAB — OB RESULTS CONSOLE GC/CHLAMYDIA
Chlamydia: NEGATIVE
Gonorrhea: NEGATIVE

## 2016-10-06 MED ORDER — METRONIDAZOLE 500 MG PO TABS: 500.0000 mg | ORAL_TABLET | Freq: Two times a day (BID) | ORAL | 0 refills | Status: DC

## 2016-10-06 MED ORDER — NIFEDIPINE 10 MG PO CAPS
10.0000 mg | ORAL_CAPSULE | Freq: Three times a day (TID) | ORAL | Status: DC
  Administered 2016-10-06: 10 mg via ORAL
  Filled 2016-10-06: qty 1

## 2016-10-06 MED ORDER — TERCONAZOLE 0.4 % VA CREA: 1.0000 | TOPICAL_CREAM | Freq: Every day | VAGINAL | 0 refills | Status: DC

## 2016-10-06 NOTE — Discharge Instructions (Signed)
Bacterial Vaginosis Bacterial vaginosis is an infection of the vagina. It happens when too many germs (bacteria) grow in the vagina. This infection puts you at risk for infections from sex (STIs). Treating this infection can lower your risk for some STIs. You should also treat this if you are pregnant. It can cause your baby to be born early. Follow these instructions at home: Medicines  Take over-the-counter and prescription medicines only as told by your doctor.  Take or use your antibiotic medicine as told by your doctor. Do not stop taking or using it even if you start to feel better. General instructions  If you your sexual partner is a woman, tell her that you have this infection. She needs to get treatment if she has symptoms. If you have a female partner, he does not need to be treated.  During treatment: ? Avoid sex. ? Do not douche. ? Avoid alcohol as told. ? Avoid breastfeeding as told.  Drink enough fluid to keep your pee (urine) clear or pale yellow.  Keep your vagina and butt (rectum) clean. ? Wash the area with warm water every day. ? Wipe from front to back after you use the toilet.  Keep all follow-up visits as told by your doctor. This is important. Preventing this condition  Do not douche.  Use only warm water to wash around your vagina.  Use protection when you have sex. This includes: ? Latex condoms. ? Dental dams.  Limit how many people you have sex with. It is best to only have sex with the same person (be monogamous).  Get tested for STIs. Have your partner get tested.  Wear underwear that is cotton or lined with cotton.  Avoid tight pants and pantyhose. This is most important in summer.  Do not use any products that have nicotine or tobacco in them. These include cigarettes and e-cigarettes. If you need help quitting, ask your doctor.  Do not use illegal drugs.  Limit how much alcohol you drink. Contact a doctor if:  Your symptoms do not get  better, even after you are treated.  You have more discharge or pain when you pee (urinate).  You have a fever.  You have pain in your belly (abdomen).  You have pain with sex.  Your bleed from your vagina between periods. Summary  This infection happens when too many germs (bacteria) grow in the vagina.  Treating this condition can lower your risk for some infections from sex (STIs).  You should also treat this if you are pregnant. It can cause early (premature) birth.  Do not stop taking or using your antibiotic medicine even if you start to feel better. This information is not intended to replace advice given to you by your health care provider. Make sure you discuss any questions you have with your health care provider. Document Released: 01/21/2008 Document Revised: 12/28/2015 Document Reviewed: 12/28/2015 Elsevier Interactive Patient Education  2017 Elsevier Inc. Vaginal Yeast infection, Adult Vaginal yeast infection is a condition that causes soreness, swelling, and redness (inflammation) of the vagina. It also causes vaginal discharge. This is a common condition. Some women get this infection frequently. What are the causes? This condition is caused by a change in the normal balance of the yeast (candida) and bacteria that live in the vagina. This change causes an overgrowth of yeast, which causes the inflammation. What increases the risk? This condition is more likely to develop in:  Women who take antibiotic medicines.  Women who have   diabetes.  Women who take birth control pills.  Women who are pregnant.  Women who douche often.  Women who have a weak defense (immune) system.  Women who have been taking steroid medicines for a long time.  Women who frequently wear tight clothing.  What are the signs or symptoms? Symptoms of this condition include:  White, thick vaginal discharge.  Swelling, itching, redness, and irritation of the vagina. The lips of the  vagina (vulva) may be affected as well.  Pain or a burning feeling while urinating.  Pain during sex.  How is this diagnosed? This condition is diagnosed with a medical history and physical exam. This will include a pelvic exam. Your health care provider will examine a sample of your vaginal discharge under a microscope. Your health care provider may send this sample for testing to confirm the diagnosis. How is this treated? This condition is treated with medicine. Medicines may be over-the-counter or prescription. You may be told to use one or more of the following:  Medicine that is taken orally.  Medicine that is applied as a cream.  Medicine that is inserted directly into the vagina (suppository).  Follow these instructions at home:  Take or apply over-the-counter and prescription medicines only as told by your health care provider.  Do not have sex until your health care provider has approved. Tell your sex partner that you have a yeast infection. That person should go to his or her health care provider if he or she develops symptoms.  Do not wear tight clothes, such as pantyhose or tight pants.  Avoid using tampons until your health care provider approves.  Eat more yogurt. This may help to keep your yeast infection from returning.  Try taking a sitz bath to help with discomfort. This is a warm water bath that is taken while you are sitting down. The water should only come up to your hips and should cover your buttocks. Do this 3-4 times per day or as told by your health care provider.  Do not douche.  Wear breathable, cotton underwear.  If you have diabetes, keep your blood sugar levels under control. Contact a health care provider if:  You have a fever.  Your symptoms go away and then return.  Your symptoms do not get better with treatment.  Your symptoms get worse.  You have new symptoms.  You develop blisters in or around your vagina.  You have blood coming  from your vagina and it is not your menstrual period.  You develop pain in your abdomen. This information is not intended to replace advice given to you by your health care provider. Make sure you discuss any questions you have with your health care provider. Document Released: 01/21/2005 Document Revised: 09/25/2015 Document Reviewed: 10/15/2014 Elsevier Interactive Patient Education  2018 Elsevier Inc.  

## 2016-10-06 NOTE — MAU Provider Note (Signed)
History     CSN: [id number]  Arrival date and time: 10/06/16 2011   First Provider Initiated Contact with Patient 10/06/16 2102      Chief Complaint  Patient presents with  . Abdominal Pain   Patient is a 31 y/o G7P4 With history of pre-term labor that has had on and off contractions and lower abdominal pain for the past several weeks. She reports she feels pressure between her legs especially with walking. She denies any vaginal discharge or vaginal bleeding. She reports regular fetal movement. She has been getting her 17-P injections and had a cervical length with her anatomy scan which was normal.     OB History    Gravida Para Term Preterm AB Living   7 5 2 3 1 4    SAB TAB Ectopic Multiple Live Births   0 0 1 0 5      Past Medical History:  Diagnosis Date  . Abnormal Pap smear   . Chlamydia   . Ectopic pregnancy   . Heart murmur    childhood  . Preterm labor     Past Surgical History:  Procedure Laterality Date  . TOOTH EXTRACTION      Family History  Problem Relation Age of Onset  . Cancer Maternal Grandfather   . Prostate cancer Paternal Grandfather   . Anesthesia problems Neg Hx   . Alcohol abuse Neg Hx   . Arthritis Neg Hx   . Asthma Neg Hx   . Birth defects Neg Hx   . COPD Neg Hx   . Depression Neg Hx   . Diabetes Neg Hx   . Drug abuse Neg Hx   . Early death Neg Hx   . Hearing loss Neg Hx   . Heart disease Neg Hx   . Hyperlipidemia Neg Hx   . Hypertension Neg Hx   . Kidney disease Neg Hx   . Learning disabilities Neg Hx   . Mental illness Neg Hx   . Mental retardation Neg Hx   . Miscarriages / Stillbirths Neg Hx   . Stroke Neg Hx   . Vision loss Neg Hx   . Other Neg Hx     Social History  Substance Use Topics  . Smoking status: Never Smoker  . Smokeless tobacco: Never Used  . Alcohol use No     Comment: Special occas. when not pregnant    Allergies: No Known Allergies  Facility-Administered Medications Prior to Admission   Medication Dose Route Frequency Provider Last Rate Last Dose  . hydroxyprogesterone caproate (MAKENA) 250 mg/mL injection 250 mg  250 mg Intramuscular Once Adam PhenixArnold, James G, MD       Prescriptions Prior to Admission  Medication Sig Dispense Refill Last Dose  . Prenatal Vit-Fe Fumarate-FA (NAT-RUL PRENATAL VITAMINS) 28-0.8 MG TABS Take by mouth.   Taking    Review of Systems  Constitutional: Negative for chills and fever.  HENT: Negative for congestion and rhinorrhea.   Respiratory: Negative for cough and shortness of breath.   Cardiovascular: Negative for chest pain and palpitations.  Genitourinary: Negative for difficulty urinating, dysuria, frequency and urgency.  Musculoskeletal: Negative for arthralgias and myalgias.  Skin: Negative for rash and wound.  Neurological: Negative for dizziness and weakness.   Physical Exam   Blood pressure 107/64, pulse 74, temperature 98.4 F (36.9 C), temperature source Oral, resp. rate 18, height 5\' 8"  (1.727 m), weight 213 lb (96.6 kg), last menstrual period 05/15/2016.  Physical Exam  Vitals reviewed. Constitutional:  She is oriented to person, place, and time. She appears well-developed and well-nourished.  HENT:  Head: Normocephalic and atraumatic.  Cardiovascular: Normal rate and intact distal pulses.  Exam reveals no gallop and no friction rub.   No murmur heard. Respiratory: Effort normal and breath sounds normal. No respiratory distress. She has no wheezes.  GI: Soft. Bowel sounds are normal. She exhibits no distension. There is no tenderness. There is no rebound and no guarding.  Musculoskeletal: Normal range of motion. She exhibits no edema.  Neurological: She is alert and oriented to person, place, and time. No cranial nerve deficit.  Skin: Skin is warm. No erythema.  Psychiatric: She has a normal mood and affect. Her behavior is normal.    MAU Course  Procedures  MDM In [place] patient underwent monitoring with tocometery which  showed no contractions.   Patient underwent wet prep and GC/CT. Wet prep showed yeast and BV.   Patient underwent cervical length was 4.6 cm.  Ok to D/C home.   Assessment and Plan  #1. Yeast infection: treat with terconazole #2. BV: treat with flagyl 500mg  BID for 1 wk.   Ernestina Penna 10/06/2016, 9:36 PM

## 2016-10-06 NOTE — MAU Note (Signed)
Pt  Says she has pelvic  Pressure - started x1-2 weeks  But  Yesterday was worse-   - today  Feels  Same- .    Feels  Cramps-  Did  Not  Call   Dr.    Maurice SmallLast  Sex - 1 week ago.  No problems  With preg-  But  Is taking 17-P injections.

## 2016-10-07 LAB — GC/CHLAMYDIA PROBE AMP (~~LOC~~) NOT AT ARMC
Chlamydia: NEGATIVE
Neisseria Gonorrhea: NEGATIVE

## 2016-10-12 ENCOUNTER — Ambulatory Visit (INDEPENDENT_AMBULATORY_CARE_PROVIDER_SITE_OTHER): Payer: Medicaid Other

## 2016-10-12 DIAGNOSIS — O09212 Supervision of pregnancy with history of pre-term labor, second trimester: Secondary | ICD-10-CM | POA: Diagnosis not present

## 2016-10-12 DIAGNOSIS — O09219 Supervision of pregnancy with history of pre-term labor, unspecified trimester: Secondary | ICD-10-CM

## 2016-10-12 MED ORDER — HYDROXYPROGESTERONE CAPROATE 250 MG/ML IM OIL
250.0000 mg | TOPICAL_OIL | Freq: Once | INTRAMUSCULAR | Status: AC
  Administered 2016-10-19: 250 mg via INTRAMUSCULAR

## 2016-10-13 ENCOUNTER — Other Ambulatory Visit (HOSPITAL_COMMUNITY): Payer: Self-pay | Admitting: Obstetrics and Gynecology

## 2016-10-13 ENCOUNTER — Ambulatory Visit (HOSPITAL_COMMUNITY)
Admission: RE | Admit: 2016-10-13 | Discharge: 2016-10-13 | Disposition: A | Discharge status: Home / Self Care | Payer: Medicaid Other | Source: Ambulatory Visit | Attending: Obstetrics | Admitting: Obstetrics

## 2016-10-13 ENCOUNTER — Other Ambulatory Visit (HOSPITAL_COMMUNITY): Payer: Self-pay | Admitting: *Deleted

## 2016-10-13 ENCOUNTER — Encounter (HOSPITAL_COMMUNITY): Payer: Self-pay

## 2016-10-13 DIAGNOSIS — O09212 Supervision of pregnancy with history of pre-term labor, second trimester: Secondary | ICD-10-CM | POA: Insufficient documentation

## 2016-10-13 DIAGNOSIS — O09219 Supervision of pregnancy with history of pre-term labor, unspecified trimester: Secondary | ICD-10-CM | POA: Diagnosis present

## 2016-10-13 DIAGNOSIS — O09899 Supervision of other high risk pregnancies, unspecified trimester: Secondary | ICD-10-CM

## 2016-10-13 DIAGNOSIS — Z3A21 21 weeks gestation of pregnancy: Secondary | ICD-10-CM | POA: Insufficient documentation

## 2016-10-15 ENCOUNTER — Inpatient Hospital Stay (HOSPITAL_COMMUNITY)
Admission: AD | Admit: 2016-10-15 | Discharge: 2016-10-15 | Disposition: A | Discharge status: Home / Self Care | Payer: Medicaid Other | Source: Ambulatory Visit | Attending: Obstetrics & Gynecology | Admitting: Obstetrics & Gynecology

## 2016-10-15 ENCOUNTER — Inpatient Hospital Stay (HOSPITAL_COMMUNITY): Payer: Medicaid Other

## 2016-10-15 ENCOUNTER — Encounter (HOSPITAL_COMMUNITY): Payer: Self-pay | Admitting: *Deleted

## 2016-10-15 DIAGNOSIS — Z79899 Other long term (current) drug therapy: Secondary | ICD-10-CM | POA: Insufficient documentation

## 2016-10-15 DIAGNOSIS — Z3A21 21 weeks gestation of pregnancy: Secondary | ICD-10-CM | POA: Diagnosis not present

## 2016-10-15 DIAGNOSIS — Z8751 Personal history of pre-term labor: Secondary | ICD-10-CM

## 2016-10-15 DIAGNOSIS — O479 False labor, unspecified: Secondary | ICD-10-CM | POA: Diagnosis not present

## 2016-10-15 LAB — URINALYSIS, ROUTINE W REFLEX MICROSCOPIC
BILIRUBIN URINE: NEGATIVE
GLUCOSE, UA: NEGATIVE mg/dL
Hgb urine dipstick: NEGATIVE
KETONES UR: NEGATIVE mg/dL
LEUKOCYTES UA: NEGATIVE
Nitrite: NEGATIVE
PH: 8 (ref 5.0–8.0)
Protein, ur: NEGATIVE mg/dL
Specific Gravity, Urine: 1.017 (ref 1.005–1.030)

## 2016-10-15 NOTE — MAU Note (Signed)
Pt presents with c/o uterine ctxs.  Pt reports ctxs began this morning around 6:30.  Pt states ctxs are every 5-6 minutes.  Denies vaginal bleeding.  Pt reports hx of 3 preterm deliveries.

## 2016-10-15 NOTE — ED Provider Notes (Signed)
Opened in error

## 2016-10-15 NOTE — MAU Provider Note (Signed)
MAU HISTORY AND PHYSICAL  Chief Complaint:  Contractions   Tanya Bailey is a 31 y.o.  [id number] with IUP at [redacted]w[redacted]d presenting for Contractions . Patient states she has been having  regular, every 5 minutes contractions, none vaginal bleeding, intact membranes, with active fetal movement.  She has a history of preterm labor at 23w with a previous pregnancy.  She has been taking weekly 17p injections.        Past Medical History:  Diagnosis Date  . Abnormal Pap smear   . Chlamydia   . Ectopic pregnancy   . Heart murmur    childhood  . Preterm labor          Past Surgical History:  Procedure Laterality Date  . TOOTH EXTRACTION     FH/SH noncontributory  No Known Allergies           Facility-Administered Medications Prior to Admission  Medication Dose Route Frequency Provider Last Rate Last Dose  . hydroxyprogesterone caproate (MAKENA) 250 mg/mL injection 250 mg  250 mg Intramuscular Once Constant, Peggy, MD              Prescriptions Prior to Admission  Medication Sig Dispense Refill Last Dose  . metroNIDAZOLE (FLAGYL) 500 MG tablet Take 1 tablet (500 mg total) by mouth 2 (two) times daily. (Patient not taking: Reported on 10/13/2016) 14 tablet 0 Not Taking  . Prenatal Vit-Fe Fumarate-FA (NAT-RUL PRENATAL VITAMINS) 28-0.8 MG TABS Take by mouth.   Taking  . terconazole (TERAZOL 7) 0.4 % vaginal cream Place 1 applicator vaginally at bedtime. (Patient not taking: Reported on 10/13/2016) 45 g 0 Not Taking    Review of Systems - Negative except for what is mentioned in HPI.  Physical Exam  Blood pressure 128/66, pulse 77, temperature 98.2 F (36.8 C), temperature source Oral, resp. rate 20, last menstrual period 05/15/2016. GENERAL: Well-developed, well-nourished female in no acute distress.  LUNGS: Clear to auscultation bilaterally.  HEART: Regular rate and rhythm. ABDOMEN: Soft, nontender, nondistended, gravid.  EXTREMITIES: Nontender, no edema, 2+  distal pulses. Cervical Exam: closed/thick/ballotable  Contractions: irritability, no contractions   Labs:      Results for orders placed or performed during the hospital encounter of 10/15/16 (from the past 24 hour(s))  Urinalysis, Routine w reflex microscopic   Collection Time: 10/15/16  9:22 AM  Result Value Ref Range   Color, Urine YELLOW YELLOW   APPearance CLEAR CLEAR   Specific Gravity, Urine 1.017 1.005 - 1.030   pH 8.0 5.0 - 8.0   Glucose, UA NEGATIVE NEGATIVE mg/dL   Hgb urine dipstick NEGATIVE NEGATIVE   Bilirubin Urine NEGATIVE NEGATIVE   Ketones, ur NEGATIVE NEGATIVE mg/dL   Protein, ur NEGATIVE NEGATIVE mg/dL   Nitrite NEGATIVE NEGATIVE   Leukocytes, UA NEGATIVE NEGATIVE   [place] Mfm Ob Transvaginal  Result Date: 10/15/2016 ----------------------------------------------------------------------  OBSTETRICS REPORT                      (Signed Final 10/15/2016 11:37 am) ---------------------------------------------------------------------- Patient Info  ID #:       [id number]                          D.O.B.:  05/29/1985 (31 yrs)  Name:       Tanya Bailey               Visit Date: 10/15/2016 10:20 am ---------------------------------------------------------------------- Performed By  Performed By:  Mesha Tester BS,       Tertiary Phy.:    [facility]                    RDMS, RVT                                                             KARIM CNM  Attending:        Charlsie Merles MD         Address:          [address]                                                             [place], [place]  Referred By:      MAU Nursing-           Location:         [facility]                    MAU/Triage  Secondary Phy.:   Jorje Guild ---------------------------------------------------------------------- Orders   #  Description                                 Code   1  [place] MFM OB  TRANSVAGINAL                      [phone]  ----------------------------------------------------------------------   #  Ordered By               Order #        Accession #    Episode #   1  Luna Kitchens         [id number]      [id number]     [id number]  ---------------------------------------------------------------------- Indications   [redacted] weeks gestation of pregnancy                Z3A.21   Preterm contractions                           O47.00   Poor obstetric history: Previous preterm       O09.219   delivery, antepartum (x3)  ---------------------------------------------------------------------- OB History  Gravidity:    7         Term:   2        Prem:   3  Ectopic:      1        Living:  4 ---------------------------------------------------------------------- Fetal Evaluation  Num  Of Fetuses:     1  Fetal Heart         153  Rate(bpm):  Cardiac Activity:   Observed  Presentation:       Transverse, head to maternal left  Placenta:           Posterior, above cervical os  P. Cord Insertion:  Previously Visualized  Amniotic Fluid  AFI FV:      Subjectively within normal limits ---------------------------------------------------------------------- Gestational Age  LMP:           21w 6d        Date:  05/15/16                 EDD:   02/19/17  Best:          [place]21w 6d     Det. By:  LMP  (05/15/16)          EDD:   02/19/17 ---------------------------------------------------------------------- Cervix Uterus Adnexa  Cervix  Length:           4.32  cm.  Measured transvaginally.  Uterus  No abnormality visualized.  Left Ovary  Size(cm)     2.59   x   2.53   x  1.56      Vol(ml): 5.4  Within normal limits. No adnexal mass visualized.  Right Ovary  Size(cm)     2.86   x   1.56   x  1.68      Vol(ml): 3.9  Within normal limits. No adnexal mass visualized.  Cul De Sac:   No free fluid seen.  Adnexa:       No abnormality visualized. ---------------------------------------------------------------------- Impression  IUP at 21+6 weeks   Normal fetal cardiac activity  Transverse presentation  Cervical length 43mm without funnelling ---------------------------------------------------------------------- Recommendations  Continue clinical evaluation and management ----------------------------------------------------------------------                 Charlsie MerlesMark Newman, MD Electronically Signed Final Report   10/15/2016 11:37 am ----------------------------------------------------------------------    Assessment: Tanya Bailey is  31 y.o. [id number] at 1736w6d presents with Contractions  Plan: Contractions - thought to be [employer] CorporationBraxton Hicks contractions. U/S reassuring with cervical length 4.32.  Cervical exam fingertip/0/ballotable.  - return precautions provided  Howard PouchLauren Feng, MD 6/21/201810:07 AM  I confirm that I have verified the information documented in the resident's note and that I have also personally reperformed the physical exam and all medical decision making activities.  Luna KitchensKathryn Antoine Vandermeulen CNM   MAU course:  Patient states that this morning she started feeling some irregular contractions that were short and 6/10 intensity. She went to work, and then decided to come here. She denies bleeding, leaking of fluid, HA, blurry vision, dysuria.   FRH 149 by Doppler  -US shows cervical length of 4.32 -Patient stable for discharge with strict return precautions. -Patient verbalized understanding Luna KitchensKathryn Delmar Dondero

## 2016-10-15 NOTE — Discharge Instructions (Signed)

## 2016-10-19 ENCOUNTER — Ambulatory Visit: Payer: Medicaid Other

## 2016-10-19 ENCOUNTER — Ambulatory Visit (INDEPENDENT_AMBULATORY_CARE_PROVIDER_SITE_OTHER): Payer: Medicaid Other

## 2016-10-19 DIAGNOSIS — Z8751 Personal history of pre-term labor: Secondary | ICD-10-CM

## 2016-10-19 DIAGNOSIS — O09212 Supervision of pregnancy with history of pre-term labor, second trimester: Secondary | ICD-10-CM | POA: Diagnosis not present

## 2016-10-19 NOTE — Progress Notes (Signed)
Patient is in the office for 17p injection, administered by Liam RogersAuriel Burch, CMA ..Marland Kitchen

## 2016-10-26 ENCOUNTER — Encounter: Payer: Medicaid Other | Admitting: Obstetrics and Gynecology

## 2016-10-27 ENCOUNTER — Ambulatory Visit (HOSPITAL_COMMUNITY)
Admission: RE | Admit: 2016-10-27 | Discharge: 2016-10-27 | Disposition: A | Discharge status: Home / Self Care | Payer: Medicaid Other | Source: Ambulatory Visit | Attending: Obstetrics | Admitting: Obstetrics

## 2016-10-27 ENCOUNTER — Encounter (HOSPITAL_COMMUNITY): Payer: Self-pay | Admitting: *Deleted

## 2016-10-27 ENCOUNTER — Inpatient Hospital Stay (HOSPITAL_COMMUNITY)
Admission: AD | Admit: 2016-10-27 | Discharge: 2016-10-27 | Disposition: A | Discharge status: Home / Self Care | Payer: Medicaid Other | Source: Ambulatory Visit | Attending: Family Medicine | Admitting: Family Medicine

## 2016-10-27 ENCOUNTER — Encounter (HOSPITAL_COMMUNITY): Payer: Self-pay

## 2016-10-27 ENCOUNTER — Inpatient Hospital Stay (HOSPITAL_COMMUNITY): Payer: Medicaid Other

## 2016-10-27 DIAGNOSIS — Z3A23 23 weeks gestation of pregnancy: Secondary | ICD-10-CM | POA: Insufficient documentation

## 2016-10-27 DIAGNOSIS — O4702 False labor before 37 completed weeks of gestation, second trimester: Secondary | ICD-10-CM

## 2016-10-27 LAB — URINALYSIS, ROUTINE W REFLEX MICROSCOPIC
Bilirubin Urine: NEGATIVE
GLUCOSE, UA: NEGATIVE mg/dL
Hgb urine dipstick: NEGATIVE
Ketones, ur: NEGATIVE mg/dL
Leukocytes, UA: NEGATIVE
Nitrite: NEGATIVE
Protein, ur: NEGATIVE mg/dL
SPECIFIC GRAVITY, URINE: 1.012 (ref 1.005–1.030)
pH: 9 — ABNORMAL HIGH (ref 5.0–8.0)

## 2016-10-27 LAB — FETAL FIBRONECTIN: Fetal Fibronectin: NEGATIVE

## 2016-10-27 MED ORDER — TERBUTALINE SULFATE 1 MG/ML IJ SOLN
0.2500 mg | Freq: Once | INTRAMUSCULAR | Status: AC
  Administered 2016-10-27: 0.25 mg via SUBCUTANEOUS
  Filled 2016-10-27: qty 1

## 2016-10-27 MED ORDER — LACTATED RINGERS IV BOLUS (SEPSIS)
1000.0000 mL | Freq: Once | INTRAVENOUS | Status: AC
  Administered 2016-10-27: 1000 mL via INTRAVENOUS

## 2016-10-27 NOTE — Discharge Instructions (Signed)

## 2016-10-27 NOTE — MAU Note (Signed)
Pt c/o contractions every 5 minutes that started today at 230pm. Pt states the contractions have been increasing in intensity since that time. Pt denies bleeding and leaking. Pt states baby is moving normally.

## 2016-10-29 ENCOUNTER — Ambulatory Visit (INDEPENDENT_AMBULATORY_CARE_PROVIDER_SITE_OTHER): Payer: Medicaid Other | Admitting: Obstetrics & Gynecology

## 2016-10-29 VITALS — BP 109/66 | HR 76 | Wt 220.7 lb

## 2016-10-29 DIAGNOSIS — O09219 Supervision of pregnancy with history of pre-term labor, unspecified trimester: Secondary | ICD-10-CM

## 2016-10-29 DIAGNOSIS — O09212 Supervision of pregnancy with history of pre-term labor, second trimester: Secondary | ICD-10-CM | POA: Diagnosis not present

## 2016-10-29 DIAGNOSIS — Z3480 Encounter for supervision of other normal pregnancy, unspecified trimester: Secondary | ICD-10-CM

## 2016-10-29 DIAGNOSIS — O09899 Supervision of other high risk pregnancies, unspecified trimester: Secondary | ICD-10-CM | POA: Insufficient documentation

## 2016-10-29 DIAGNOSIS — Z3482 Encounter for supervision of other normal pregnancy, second trimester: Secondary | ICD-10-CM

## 2016-10-29 MED ORDER — HYDROXYPROGESTERONE CAPROATE 250 MG/ML IM OIL
250.0000 mg | TOPICAL_OIL | Freq: Once | INTRAMUSCULAR | Status: AC
  Administered 2016-10-29: 250 mg via INTRAMUSCULAR

## 2016-10-29 NOTE — Patient Instructions (Signed)

## 2016-10-29 NOTE — Progress Notes (Signed)
17 given in RUOQ. Tolerated well. Administrations This Visit    hydroxyprogesterone caproate (MAKENA) 250 mg/mL injection 250 mg    Admin Date 10/29/2016 Action Given Dose 250 mg Route Intramuscular Administered By Maretta BeesMcGlashan, Brailyn Killion J, RMA

## 2016-10-29 NOTE — Progress Notes (Signed)
US for CL in 11 days   PRENATAL VISIT NOTE  Subjective:  Tanya Bailey is a 31 y.o. [phone] at 262w6d being seen today for ongoing prenatal care.  She is currently monitored for the following issues for this high-risk pregnancy and has Supervision of other normal pregnancy, antepartum and History of preterm delivery, currently pregnant on her problem list.  Patient reports occasional contractions.  Contractions: Irregular. Vag. Bleeding: None.  Movement: Present. Denies leaking of fluid.   The following portions of the patient's history were reviewed and updated as appropriate: allergies, current medications, past family history, past medical history, past social history, past surgical history and problem list. Problem list updated.  Objective:   Vitals:   10/29/16 1629  BP: 109/66  Pulse: 76  Weight: 220 lb 11.2 oz (100.1 kg)    Fetal Status: Fetal Heart Rate (bpm): 142   Movement: Present     General:  Alert, oriented and cooperative. Patient is in no acute distress.  Skin: Skin is warm and dry. No rash noted.   Cardiovascular: Normal heart rate noted  Respiratory: Normal respiratory effort, no problems with respiration noted  Abdomen: Soft, gravid, appropriate for gestational age. Pain/Pressure: Absent     Pelvic:  Cervical exam deferred        Extremities: Normal range of motion.  Edema: Trace  Mental Status: Normal mood and affect. Normal behavior. Normal judgment and thought content.   Assessment and Plan:  Pregnancy: [id number] at 652w6d  1. Supervision of other normal pregnancy, antepartum   2. History of preterm delivery, currently pregnant CL in 11 days  Preterm labor symptoms and general obstetric precautions including but not limited to vaginal bleeding, contractions, leaking of fluid and fetal movement were reviewed in detail with the patient. Please refer to After Visit Summary for other counseling recommendations.  Return in about 3 weeks (around 11/19/2016)  for 17 p every week.   Scheryl DarterJames Arnold, MD

## 2016-11-05 ENCOUNTER — Ambulatory Visit: Payer: Medicaid Other

## 2016-11-09 ENCOUNTER — Other Ambulatory Visit (HOSPITAL_COMMUNITY): Payer: Self-pay | Admitting: Obstetrics and Gynecology

## 2016-11-09 ENCOUNTER — Ambulatory Visit (HOSPITAL_COMMUNITY)
Admission: RE | Admit: 2016-11-09 | Discharge: 2016-11-09 | Disposition: A | Discharge status: Home / Self Care | Payer: Medicaid Other | Source: Ambulatory Visit | Attending: Obstetrics | Admitting: Obstetrics

## 2016-11-09 ENCOUNTER — Encounter (HOSPITAL_COMMUNITY): Payer: Self-pay

## 2016-11-09 DIAGNOSIS — O09219 Supervision of pregnancy with history of pre-term labor, unspecified trimester: Secondary | ICD-10-CM

## 2016-11-09 DIAGNOSIS — Z362 Encounter for other antenatal screening follow-up: Secondary | ICD-10-CM | POA: Diagnosis not present

## 2016-11-09 DIAGNOSIS — O09899 Supervision of other high risk pregnancies, unspecified trimester: Secondary | ICD-10-CM

## 2016-11-09 DIAGNOSIS — Z3A25 25 weeks gestation of pregnancy: Secondary | ICD-10-CM

## 2016-11-09 DIAGNOSIS — O09212 Supervision of pregnancy with history of pre-term labor, second trimester: Secondary | ICD-10-CM | POA: Diagnosis not present

## 2016-11-12 ENCOUNTER — Ambulatory Visit: Payer: Medicaid Other

## 2016-11-19 ENCOUNTER — Ambulatory Visit (INDEPENDENT_AMBULATORY_CARE_PROVIDER_SITE_OTHER): Payer: Medicaid Other | Admitting: Obstetrics and Gynecology

## 2016-11-19 ENCOUNTER — Other Ambulatory Visit: Payer: Medicaid Other

## 2016-11-19 VITALS — BP 115/69 | HR 80 | Wt 222.5 lb

## 2016-11-19 DIAGNOSIS — O09212 Supervision of pregnancy with history of pre-term labor, second trimester: Secondary | ICD-10-CM

## 2016-11-19 DIAGNOSIS — O09899 Supervision of other high risk pregnancies, unspecified trimester: Secondary | ICD-10-CM

## 2016-11-19 DIAGNOSIS — Z3482 Encounter for supervision of other normal pregnancy, second trimester: Secondary | ICD-10-CM

## 2016-11-19 DIAGNOSIS — Z3480 Encounter for supervision of other normal pregnancy, unspecified trimester: Secondary | ICD-10-CM

## 2016-11-19 DIAGNOSIS — Z23 Encounter for immunization: Secondary | ICD-10-CM | POA: Diagnosis not present

## 2016-11-19 DIAGNOSIS — O09219 Supervision of pregnancy with history of pre-term labor, unspecified trimester: Principal | ICD-10-CM

## 2016-11-19 MED ORDER — TETANUS-DIPHTH-ACELL PERTUSSIS 5-2.5-18.5 LF-MCG/0.5 IM SUSP: 0.5000 mL | Freq: Once | INTRAMUSCULAR | Status: DC

## 2016-11-19 NOTE — Progress Notes (Signed)
   PRENATAL VISIT NOTE  Subjective:  Tanya Bailey is a 31 y.o. [phone] at 3114w6d being seen today for ongoing prenatal care.  She is currently monitored for the following issues for this high-risk pregnancy and has Supervision of other normal pregnancy, antepartum and History of preterm delivery, currently pregnant on her problem list.  Patient reports no complaints.  Contractions: Not present. Vag. Bleeding: None.  Movement: Present. Denies leaking of fluid.   The following portions of the patient's history were reviewed and updated as appropriate: allergies, current medications, past family history, past medical history, past social history, past surgical history and problem list. Problem list updated.  Objective:   Vitals:   11/19/16 0933  BP: 115/69  Pulse: 80  Weight: 222 lb 8 oz (100.9 kg)    Fetal Status: Fetal Heart Rate (bpm): 144   Movement: Present     General:  Alert, oriented and cooperative. Patient is in no acute distress.  Skin: Skin is warm and dry. No rash noted.   Cardiovascular: Normal heart rate noted  Respiratory: Normal respiratory effort, no problems with respiration noted  Abdomen: Soft, gravid, appropriate for gestational age.  Pain/Pressure: Present     Pelvic: Cervical exam deferred        Extremities: Normal range of motion.  Edema: Trace  Mental Status:  Normal mood and affect. Normal behavior. Normal judgment and thought content.   Assessment and Plan:  Pregnancy: [id number] at 3134w6d  1. History of preterm delivery, currently pregnant Patient declined further 17-P injections 7/16 CL 3.4 cm  2. Supervision of other normal pregnancy, antepartum Patient is doing well Third trimester labs, glucola and tdap today  Preterm labor symptoms and general obstetric precautions including but not limited to vaginal bleeding, contractions, leaking of fluid and fetal movement were reviewed in detail with the patient. Please refer to After Visit Summary for  other counseling recommendations.  Return in about 2 weeks (around 12/03/2016) for ROB, weekly for 17-P.   Catalina AntiguaPeggy Gearald Stonebraker, MD

## 2016-11-20 LAB — CBC
HEMATOCRIT: 34.7 % (ref 34.0–46.6)
Hemoglobin: 11.8 g/dL (ref 11.1–15.9)
MCH: 31.3 pg (ref 26.6–33.0)
MCHC: 34 g/dL (ref 31.5–35.7)
MCV: 92 fL (ref 79–97)
Platelets: 148 10*3/uL — ABNORMAL LOW (ref 150–379)
RBC: 3.77 x10E6/uL (ref 3.77–5.28)
RDW: 13.2 % (ref 12.3–15.4)
WBC: 7.2 10*3/uL (ref 3.4–10.8)

## 2016-11-20 LAB — GLUCOSE TOLERANCE, 2 HOURS W/ 1HR
GLUCOSE, 1 HOUR: 155 mg/dL (ref 65–179)
GLUCOSE, FASTING: 83 mg/dL (ref 65–91)
Glucose, 2 hour: 120 mg/dL (ref 65–152)

## 2016-11-20 LAB — HIV ANTIBODY (ROUTINE TESTING W REFLEX): HIV Screen 4th Generation wRfx: NONREACTIVE

## 2016-11-20 LAB — RPR: RPR: NONREACTIVE

## 2016-12-01 ENCOUNTER — Ambulatory Visit (INDEPENDENT_AMBULATORY_CARE_PROVIDER_SITE_OTHER): Payer: Medicaid Other | Admitting: Obstetrics and Gynecology

## 2016-12-01 ENCOUNTER — Encounter: Payer: Self-pay | Admitting: Obstetrics and Gynecology

## 2016-12-01 VITALS — BP 101/71 | HR 73 | Wt 223.1 lb

## 2016-12-01 DIAGNOSIS — O09899 Supervision of other high risk pregnancies, unspecified trimester: Secondary | ICD-10-CM

## 2016-12-01 DIAGNOSIS — O0993 Supervision of high risk pregnancy, unspecified, third trimester: Secondary | ICD-10-CM

## 2016-12-01 DIAGNOSIS — O09213 Supervision of pregnancy with history of pre-term labor, third trimester: Secondary | ICD-10-CM

## 2016-12-01 DIAGNOSIS — O09219 Supervision of pregnancy with history of pre-term labor, unspecified trimester: Principal | ICD-10-CM

## 2016-12-01 NOTE — Progress Notes (Signed)
Patient reports good fetal movement, denies pain. Pt declined to continue 17-p injections.

## 2016-12-01 NOTE — Progress Notes (Signed)
Prenatal Visit Note Date: 12/01/2016 Clinic: [facility]  Subjective:  Tanya Bailey is a 31 y.o. [id number] at 476w4d being seen today for ongoing prenatal care.  She is currently monitored for the following issues for this high-risk pregnancy and has Supervision of high risk pregnancy, antepartum, third trimester and History of preterm delivery, currently pregnant on her problem list.  Patient reports no complaints.   Contractions: Not present. Vag. Bleeding: None.  Movement: Present. Denies leaking of fluid.   The following portions of the patient's history were reviewed and updated as appropriate: allergies, current medications, past family history, past medical history, past social history, past surgical history and problem list. Problem list updated.  Objective:   Vitals:   12/01/16 1045  BP: 101/71  Pulse: 73  Weight: 223 lb 1.6 oz (101.2 kg)    Fetal Status: Fetal Heart Rate (bpm): 130s Fundal Height: 28 cm Movement: Present     General:  Alert, oriented and cooperative. Patient is in no acute distress.  Skin: Skin is warm and dry. No rash noted.   Cardiovascular: Normal heart rate noted  Respiratory: Normal respiratory effort, no problems with respiration noted  Abdomen: Soft, gravid, appropriate for gestational age. Pain/Pressure: Absent     Pelvic:  Cervical exam deferred        Extremities: Normal range of motion.  Edema: Trace  Mental Status: Normal mood and affect. Normal behavior. Normal judgment and thought content.   Urinalysis:      Assessment and Plan:  Pregnancy: [id number] at 9376w4d  1. Supervision of high risk pregnancy, antepartum, third trimester Routine care. Mirena  2. History of preterm delivery, currently pregnant Declines any more 17p injections b/c it gives her hot flashes and makes her feel uncomfortable. Benefit of it d/w her and she declines. Also d/w her that can do vaginal progesterone as it may provide some benefit but she  declines this too. 7/16 growth scan and CL normal and greater than 3cm  Preterm labor symptoms and general obstetric precautions including but not limited to vaginal bleeding, contractions, leaking of fluid and fetal movement were reviewed in detail with the patient. Please refer to After Visit Summary for other counseling recommendations.  Return in about 2 weeks (around 12/15/2016) for rob.   Torreon BingPickens, Tanya Asleson, MD

## 2016-12-15 ENCOUNTER — Encounter: Payer: Self-pay | Admitting: Obstetrics

## 2016-12-15 ENCOUNTER — Ambulatory Visit (INDEPENDENT_AMBULATORY_CARE_PROVIDER_SITE_OTHER): Payer: Medicaid Other | Admitting: Obstetrics

## 2016-12-15 VITALS — BP 116/77 | HR 74 | Wt 225.6 lb

## 2016-12-15 DIAGNOSIS — Z8759 Personal history of other complications of pregnancy, childbirth and the puerperium: Secondary | ICD-10-CM

## 2016-12-15 DIAGNOSIS — O09219 Supervision of pregnancy with history of pre-term labor, unspecified trimester: Secondary | ICD-10-CM

## 2016-12-15 DIAGNOSIS — O09213 Supervision of pregnancy with history of pre-term labor, third trimester: Secondary | ICD-10-CM

## 2016-12-15 DIAGNOSIS — O09899 Supervision of other high risk pregnancies, unspecified trimester: Secondary | ICD-10-CM

## 2016-12-15 DIAGNOSIS — O0993 Supervision of high risk pregnancy, unspecified, third trimester: Secondary | ICD-10-CM

## 2016-12-15 NOTE — Progress Notes (Signed)
Subjective:  Tanya Bailey is a 31 y.o. [phone] at [redacted]w[redacted]d being seen today for ongoing prenatal care.  She is currently monitored for the following issues for this high-risk pregnancy and has Supervision of high risk pregnancy, antepartum, third trimester and History of preterm delivery, currently pregnant on her problem list.  Patient reports no complaints.  Contractions: Irritability. Vag. Bleeding: None.  Movement: Present. Denies leaking of fluid.   The following portions of the patient's history were reviewed and updated as appropriate: allergies, current medications, past family history, past medical history, past social history, past surgical history and problem list. Problem list updated.  Objective:   Vitals:   12/15/16 0902  BP: 116/77  Pulse: 74  Weight: 225 lb 9.6 oz (102.3 kg)    Fetal Status: Fetal Heart Rate (bpm): 140   Movement: Present     General:  Alert, oriented and cooperative. Patient is in no acute distress.  Skin: Skin is warm and dry. No rash noted.   Cardiovascular: Normal heart rate noted  Respiratory: Normal respiratory effort, no problems with respiration noted  Abdomen: Soft, gravid, appropriate for gestational age. Pain/Pressure: Present     Pelvic:  Cervical exam deferred        Extremities: Normal range of motion.  Edema: None  Mental Status: Normal mood and affect. Normal behavior. Normal judgment and thought content.   Urinalysis:      Assessment and Plan:  Pregnancy: [id number] at [redacted]w[redacted]d  1. Supervision of high risk pregnancy, antepartum, third trimester   2. History of preterm delivery, currently pregnant    3. History of stillbirth   Preterm labor symptoms and general obstetric precautions including but not limited to vaginal bleeding, contractions, leaking of fluid and fetal movement were reviewed in detail with the patient. Please refer to After Visit Summary for other counseling recommendations.  Return in about 2 weeks (around  12/29/2016) for ROB.   Brock Bad, MD

## 2016-12-15 NOTE — Progress Notes (Signed)
Patient is having more pressure. She has a contraction here and there- maybe 2/day. No urinary complaints or back pain.

## 2016-12-29 ENCOUNTER — Ambulatory Visit (INDEPENDENT_AMBULATORY_CARE_PROVIDER_SITE_OTHER): Payer: Medicaid Other | Admitting: Obstetrics

## 2016-12-29 VITALS — BP 127/77 | HR 94 | Wt 227.0 lb

## 2016-12-29 DIAGNOSIS — O0993 Supervision of high risk pregnancy, unspecified, third trimester: Secondary | ICD-10-CM

## 2016-12-29 DIAGNOSIS — O09899 Supervision of other high risk pregnancies, unspecified trimester: Secondary | ICD-10-CM

## 2016-12-29 DIAGNOSIS — Z8759 Personal history of other complications of pregnancy, childbirth and the puerperium: Secondary | ICD-10-CM

## 2016-12-29 DIAGNOSIS — O09219 Supervision of pregnancy with history of pre-term labor, unspecified trimester: Secondary | ICD-10-CM

## 2016-12-29 DIAGNOSIS — O09213 Supervision of pregnancy with history of pre-term labor, third trimester: Secondary | ICD-10-CM

## 2016-12-29 NOTE — Progress Notes (Signed)
Patient reports her feet are swelling. She thinks she is losing her mucus plug- she does feel a lot of pressure that sometimes hurts to walk.

## 2016-12-30 ENCOUNTER — Encounter: Payer: Self-pay | Admitting: Obstetrics

## 2016-12-30 NOTE — Progress Notes (Signed)
Subjective:  Tanya Bailey is a 31 y.o. [phone] at 1066w5d being seen today for ongoing prenatal care.  She is currently monitored for the following issues for this high-risk pregnancy and has Supervision of high risk pregnancy, antepartum, third trimester and History of preterm delivery, currently pregnant on her problem list.  Patient reports pelvic pressure.  Contractions: Irregular. Vag. Bleeding: None.  Movement: Present. Denies leaking of fluid.   The following portions of the patient's history were reviewed and updated as appropriate: allergies, current medications, past family history, past medical history, past social history, past surgical history and problem list. Problem list updated.  Objective:   Vitals:   12/29/16 1605  BP: 127/77  Pulse: 94  Weight: 227 lb (103 kg)    Fetal Status: Fetal Heart Rate (bpm): 140   Movement: Present     General:  Alert, oriented and cooperative. Patient is in no acute distress.  Skin: Skin is warm and dry. No rash noted.   Cardiovascular: Normal heart rate noted  Respiratory: Normal respiratory effort, no problems with respiration noted  Abdomen: Soft, gravid, appropriate for gestational age. Pain/Pressure: Present     Pelvic:  Cervical exam deferred        Extremities: Normal range of motion.  Edema: None  Mental Status: Normal mood and affect. Normal behavior. Normal judgment and thought content.   Urinalysis:      Assessment and Plan:  Pregnancy: [id number] at 3266w5d  1. Supervision of high risk pregnancy, antepartum, third trimester   2. History of preterm delivery, currently pregnant - weekly 17-P injections until 36 weeks  3. History of stillbirth   Preterm labor symptoms and general obstetric precautions including but not limited to vaginal bleeding, contractions, leaking of fluid and fetal movement were reviewed in detail with the patient. Please refer to After Visit Summary for other counseling recommendations.  Return in  about 1 week (around 01/05/2017) for ROB.   Brock BadHarper, Theresea Trautmann A, MD

## 2017-01-12 ENCOUNTER — Ambulatory Visit (INDEPENDENT_AMBULATORY_CARE_PROVIDER_SITE_OTHER): Payer: Medicaid Other | Admitting: Obstetrics

## 2017-01-12 ENCOUNTER — Encounter (HOSPITAL_COMMUNITY): Payer: Self-pay | Admitting: *Deleted

## 2017-01-12 ENCOUNTER — Encounter: Payer: Self-pay | Admitting: Obstetrics

## 2017-01-12 ENCOUNTER — Inpatient Hospital Stay (HOSPITAL_COMMUNITY)
Admission: AD | Admit: 2017-01-12 | Discharge: 2017-01-12 | Disposition: A | Discharge status: Home / Self Care | Payer: Medicaid Other | Source: Ambulatory Visit | Attending: Family Medicine | Admitting: Family Medicine

## 2017-01-12 VITALS — BP 126/85 | HR 76 | Wt 232.4 lb

## 2017-01-12 DIAGNOSIS — Z3A34 34 weeks gestation of pregnancy: Secondary | ICD-10-CM | POA: Diagnosis not present

## 2017-01-12 DIAGNOSIS — R609 Edema, unspecified: Secondary | ICD-10-CM | POA: Diagnosis present

## 2017-01-12 DIAGNOSIS — Z3689 Encounter for other specified antenatal screening: Secondary | ICD-10-CM

## 2017-01-12 DIAGNOSIS — O1203 Gestational edema, third trimester: Secondary | ICD-10-CM | POA: Insufficient documentation

## 2017-01-12 DIAGNOSIS — O368130 Decreased fetal movements, third trimester, not applicable or unspecified: Secondary | ICD-10-CM | POA: Insufficient documentation

## 2017-01-12 DIAGNOSIS — Z8759 Personal history of other complications of pregnancy, childbirth and the puerperium: Secondary | ICD-10-CM

## 2017-01-12 DIAGNOSIS — O09213 Supervision of pregnancy with history of pre-term labor, third trimester: Secondary | ICD-10-CM

## 2017-01-12 DIAGNOSIS — O09219 Supervision of pregnancy with history of pre-term labor, unspecified trimester: Principal | ICD-10-CM

## 2017-01-12 DIAGNOSIS — O0993 Supervision of high risk pregnancy, unspecified, third trimester: Secondary | ICD-10-CM

## 2017-01-12 DIAGNOSIS — O09899 Supervision of other high risk pregnancies, unspecified trimester: Secondary | ICD-10-CM

## 2017-01-12 HISTORY — DX: Unspecified abnormal cytological findings in specimens from vagina: R87.629

## 2017-01-12 LAB — URINALYSIS, ROUTINE W REFLEX MICROSCOPIC
BILIRUBIN URINE: NEGATIVE
Glucose, UA: NEGATIVE mg/dL
Hgb urine dipstick: NEGATIVE
Ketones, ur: NEGATIVE mg/dL
Leukocytes, UA: NEGATIVE
NITRITE: NEGATIVE
PH: 7 (ref 5.0–8.0)
Protein, ur: NEGATIVE mg/dL
Specific Gravity, Urine: 1.003 — ABNORMAL LOW (ref 1.005–1.030)

## 2017-01-12 NOTE — Discharge Instructions (Signed)
Third Trimester of Pregnancy The third trimester is from week 28 through week 40 (months 7 through 9). The third trimester is a time when the unborn baby (fetus) is growing rapidly. At the end of the ninth month, the fetus is about 20 inches in length and weighs 6-10 pounds. Body changes during your third trimester Your body will continue to go through many changes during pregnancy. The changes vary from woman to woman. During the third trimester:  Your weight will continue to increase. You can expect to gain 25-35 pounds (11-16 kg) by the end of the pregnancy.  You may begin to get stretch marks on your hips, abdomen, and breasts.  You may urinate more often because the fetus is moving lower into your pelvis and pressing on your bladder.  You may develop or continue to have heartburn. This is caused by increased hormones that slow down muscles in the digestive tract.  You may develop or continue to have constipation because increased hormones slow digestion and cause the muscles that push waste through your intestines to relax.  You may develop hemorrhoids. These are swollen veins (varicose veins) in the rectum that can itch or be painful.  You may develop swollen, bulging veins (varicose veins) in your legs.  You may have increased body aches in the pelvis, back, or thighs. This is due to weight gain and increased hormones that are relaxing your joints.  You may have changes in your hair. These can include thickening of your hair, rapid growth, and changes in texture. Some women also have hair loss during or after pregnancy, or hair that feels dry or thin. Your hair will most likely return to normal after your baby is born.  Your breasts will continue to grow and they will continue to become tender. A yellow fluid (colostrum) may leak from your breasts. This is the first milk you are producing for your baby.  Your belly button may stick out.  You may notice more swelling in your hands,  face, or ankles.  You may have increased tingling or numbness in your hands, arms, and legs. The skin on your belly may also feel numb.  You may feel short of breath because of your expanding uterus.  You may have more problems sleeping. This can be caused by the size of your belly, increased need to urinate, and an increase in your body's metabolism.  You may notice the fetus "dropping," or moving lower in your abdomen (lightening).  You may have increased vaginal discharge.  You may notice your joints feel loose and you may have pain around your pelvic bone.  What to expect at prenatal visits You will have prenatal exams every 2 weeks until week 36. Then you will have weekly prenatal exams. During a routine prenatal visit:  You will be weighed to make sure you and the baby are growing normally.  Your blood pressure will be taken.  Your abdomen will be measured to track your baby's growth.  The fetal heartbeat will be listened to.  Any test results from the previous visit will be discussed.  You may have a cervical check near your due date to see if your cervix has softened or thinned (effaced).  You will be tested for Group B streptococcus. This happens between 35 and 37 weeks.  Your health care provider may ask you:  What your birth plan is.  How you are feeling.  If you are feeling the baby move.  If you have had   any abnormal symptoms, such as leaking fluid, bleeding, severe headaches, or abdominal cramping.  If you are using any tobacco products, including cigarettes, chewing tobacco, and electronic cigarettes.  If you have any questions.  Other tests or screenings that may be performed during your third trimester include:  Blood tests that check for low iron levels (anemia).  Fetal testing to check the health, activity level, and growth of the fetus. Testing is done if you have certain medical conditions or if there are problems during the  pregnancy.  Nonstress test (NST). This test checks the health of your baby to make sure there are no signs of problems, such as the baby not getting enough oxygen. During this test, a belt is placed around your belly. The baby is made to move, and its heart rate is monitored during movement.  What is false labor? False labor is a condition in which you feel small, irregular tightenings of the muscles in the womb (contractions) that usually go away with rest, changing position, or drinking water. These are called Braxton Hicks contractions. Contractions may last for hours, days, or even weeks before true labor sets in. If contractions come at regular intervals, become more frequent, increase in intensity, or become painful, you should see your health care provider. What are the signs of labor?  Abdominal cramps.  Regular contractions that start at 10 minutes apart and become stronger and more frequent with time.  Contractions that start on the top of the uterus and spread down to the lower abdomen and back.  Increased pelvic pressure and dull back pain.  A watery or bloody mucus discharge that comes from the vagina.  Leaking of amniotic fluid. This is also known as your "water breaking." It could be a slow trickle or a gush. Let your health care provider know if it has a color or strange odor. If you have any of these signs, call your health care provider right away, even if it is before your due date. Follow these instructions at home: Medicines  Follow your health care provider's instructions regarding medicine use. Specific medicines may be either safe or unsafe to take during pregnancy.  Take a prenatal vitamin that contains at least 600 micrograms (mcg) of folic acid.  If you develop constipation, try taking a stool softener if your health care provider approves. Eating and drinking  Eat a balanced diet that includes fresh fruits and vegetables, whole grains, good sources of protein  such as meat, eggs, or tofu, and low-fat dairy. Your health care provider will help you determine the amount of weight gain that is right for you.  Avoid raw meat and uncooked cheese. These carry germs that can cause birth defects in the baby.  If you have low calcium intake from food, talk to your health care provider about whether you should take a daily calcium supplement.  Eat four or five small meals rather than three large meals a day.  Limit foods that are high in fat and processed sugars, such as fried and sweet foods.  To prevent constipation: ? Drink enough fluid to keep your urine clear or pale yellow. ? Eat foods that are high in fiber, such as fresh fruits and vegetables, whole grains, and beans. Activity  Exercise only as directed by your health care provider. Most women can continue their usual exercise routine during pregnancy. Try to exercise for 30 minutes at least 5 days a week. Stop exercising if you experience uterine contractions.  Avoid heavy   lifting.  Do not exercise in extreme heat or humidity, or at high altitudes.  Wear low-heel, comfortable shoes.  Practice good posture.  You may continue to have sex unless your health care provider tells you otherwise. Relieving pain and discomfort  Take frequent breaks and rest with your legs elevated if you have leg cramps or low back pain.  Take warm sitz baths to soothe any pain or discomfort caused by hemorrhoids. Use hemorrhoid cream if your health care provider approves.  Wear a good support bra to prevent discomfort from breast tenderness.  If you develop varicose veins: ? Wear support pantyhose or compression stockings as told by your healthcare provider. ? Elevate your feet for 15 minutes, 3-4 times a day. Prenatal care  Write down your questions. Take them to your prenatal visits.  Keep all your prenatal visits as told by your health care provider. This is important. Safety  Wear your seat belt at  all times when driving.  Make a list of emergency phone numbers, including numbers for family, friends, the hospital, and police and fire departments. General instructions  Avoid cat litter boxes and soil used by cats. These carry germs that can cause birth defects in the baby. If you have a cat, ask someone to clean the litter box for you.  Do not travel far distances unless it is absolutely necessary and only with the approval of your health care provider.  Do not use hot tubs, steam rooms, or saunas.  Do not drink alcohol.  Do not use any products that contain nicotine or tobacco, such as cigarettes and e-cigarettes. If you need help quitting, ask your health care provider.  Do not use any medicinal herbs or unprescribed drugs. These chemicals affect the formation and growth of the baby.  Do not douche or use tampons or scented sanitary pads.  Do not cross your legs for long periods of time.  To prepare for the arrival of your baby: ? Take prenatal classes to understand, practice, and ask questions about labor and delivery. ? Make a trial run to the hospital. ? Visit the hospital and tour the maternity area. ? Arrange for maternity or paternity leave through employers. ? Arrange for family and friends to take care of pets while you are in the hospital. ? Purchase a rear-facing car seat and make sure you know how to install it in your car. ? Pack your hospital bag. ? Prepare the baby's nursery. Make sure to remove all pillows and stuffed animals from the baby's crib to prevent suffocation.  Visit your dentist if you have not gone during your pregnancy. Use a soft toothbrush to brush your teeth and be gentle when you floss. Contact a health care provider if:  You are unsure if you are in labor or if your water has broken.  You become dizzy.  You have mild pelvic cramps, pelvic pressure, or nagging pain in your abdominal area.  You have lower back pain.  You have persistent  nausea, vomiting, or diarrhea.  You have an unusual or bad smelling vaginal discharge.  You have pain when you urinate. Get help right away if:  Your water breaks before 37 weeks.  You have regular contractions less than 5 minutes apart before 37 weeks.  You have a fever.  You are leaking fluid from your vagina.  You have spotting or bleeding from your vagina.  You have severe abdominal pain or cramping.  You have rapid weight loss or weight gain.    You have shortness of breath with chest pain.  You notice sudden or extreme swelling of your face, hands, ankles, feet, or legs.  Your baby makes fewer than 10 movements in 2 hours.  You have severe headaches that do not go away when you take medicine.  You have vision changes. Summary  The third trimester is from week 28 through week 40, months 7 through 9. The third trimester is a time when the unborn baby (fetus) is growing rapidly.  During the third trimester, your discomfort may increase as you and your baby continue to gain weight. You may have abdominal, leg, and back pain, sleeping problems, and an increased need to urinate.  During the third trimester your breasts will keep growing and they will continue to become tender. A yellow fluid (colostrum) may leak from your breasts. This is the first milk you are producing for your baby.  False labor is a condition in which you feel small, irregular tightenings of the muscles in the womb (contractions) that eventually go away. These are called Braxton Hicks contractions. Contractions may last for hours, days, or even weeks before true labor sets in.  Signs of labor can include: abdominal cramps; regular contractions that start at 10 minutes apart and become stronger and more frequent with time; watery or bloody mucus discharge that comes from the vagina; increased pelvic pressure and dull back pain; and leaking of amniotic fluid. This information is not intended to replace advice  given to you by your health care provider. Make sure you discuss any questions you have with your health care provider. Document Released: 04/07/2001 Document Revised: 09/19/2015 Document Reviewed: 06/14/2012 Elsevier Interactive Patient Education  2017 [employer] Fetal Movement Counts Patient Name: ________________________________________________ Patient Due Date: ____________________ What is a fetal movement count? A fetal movement count is the number of times that you feel your baby move during a certain amount of time. This may also be called a fetal kick count. A fetal movement count is recommended for every pregnant woman. You may be asked to start counting fetal movements as early as week 28 of your pregnancy. Pay attention to when your baby is most active. You may notice your baby's sleep and wake cycles. You may also notice things that make your baby move more. You should do a fetal movement count:  When your baby is normally most active.  At the same time each day.  A good time to count movements is while you are resting, after having something to eat and drink. How do I count fetal movements? 1. Find a quiet, comfortable area. Sit, or lie down on your side. 2. Write down the date, the start time and stop time, and the number of movements that you felt between those two times. Take this information with you to your health care visits. 3. For 2 hours, count kicks, flutters, swishes, rolls, and jabs. You should feel at least 10 movements during 2 hours. 4. You may stop counting after you have felt 10 movements. 5. If you do not feel 10 movements in 2 hours, have something to eat and drink. Then, keep resting and counting for 1 hour. If you feel at least 4 movements during that hour, you may stop counting. Contact a health care provider if:  You feel fewer than 4 movements in 2 hours.  Your baby is not moving like he or she usually does. Date: ____________ Start time: ____________  Stop time: ____________ Movements: ____________ Date: ____________ Start time:   ____________ Stop time: ____________ Movements: ____________ Date: ____________ Start time: ____________ Stop time: ____________ Movements: ____________ Date: ____________ Start time: ____________ Stop time: ____________ Movements: ____________ Date: ____________ Start time: ____________ Stop time: ____________ Movements: ____________ Date: ____________ Start time: ____________ Stop time: ____________ Movements: ____________ Date: ____________ Start time: ____________ Stop time: ____________ Movements: ____________ Date: ____________ Start time: ____________ Stop time: ____________ Movements: ____________ Date: ____________ Start time: ____________ Stop time: ____________ Movements: ____________ This information is not intended to replace advice given to you by your health care provider. Make sure you discuss any questions you have with your health care provider. Document Released: 05/13/2006 Document Revised: 12/11/2015 Document Reviewed: 05/23/2015 Elsevier Interactive Patient Education  2018 [employer]  

## 2017-01-12 NOTE — Progress Notes (Signed)
Subjective:  Tanya Bailey is a 31 y.o. [phone] at [redacted]w[redacted]d being seen today for ongoing prenatal care.  She is currently monitored for the following issues for this high-risk pregnancy and has Supervision of high risk pregnancy, antepartum, third trimester and History of preterm delivery, currently pregnant on her problem list.  Patient reports no complaints.  Contractions: Irregular. Vag. Bleeding: None.  Movement: Present. Denies leaking of fluid.   The following portions of the patient's history were reviewed and updated as appropriate: allergies, current medications, past family history, past medical history, past social history, past surgical history and problem list. Problem list updated.  Objective:   Vitals:   01/12/17 1600  BP: 126/85  Pulse: 76  Weight: 232 lb 6.4 oz (105.4 kg)    Fetal Status: Fetal Heart Rate (bpm): 140   Movement: Present     General:  Alert, oriented and cooperative. Patient is in no acute distress.  Skin: Skin is warm and dry. No rash noted.   Cardiovascular: Normal heart rate noted  Respiratory: Normal respiratory effort, no problems with respiration noted  Abdomen: Soft, gravid, appropriate for gestational age. Pain/Pressure: Present     Pelvic:  Cervical exam deferred        Extremities: Normal range of motion.  Edema: Trace  Mental Status: Normal mood and affect. Normal behavior. Normal judgment and thought content.   Urinalysis:      Assessment and Plan:  Pregnancy: [id number] at 103w4d  1. Supervision of high risk pregnancy, antepartum, third trimester   2. History of preterm delivery, currently pregnant   3. History of stillbirth   Preterm labor symptoms and general obstetric precautions including but not limited to vaginal bleeding, contractions, leaking of fluid and fetal movement were reviewed in detail with the patient. Please refer to After Visit Summary for other counseling recommendations.  Return in about 1 week (around  01/19/2017) for ROB.  GBS.Marland Kitchen   Brock Bad, MD

## 2017-01-12 NOTE — MAU Provider Note (Signed)
History     CSN: [id number]  Arrival date and time: 01/12/17 4098   First Provider Initiated Contact with Patient 01/12/17 0102      Chief Complaint  Patient presents with  . Edema   Tanya Bailey is a 31 y.o. [id number] at [redacted]w[redacted]d who presents today with swelling of her feet. She states that she has had some mild swelling for about 2 weeks, but today it was much worse. She tried elevating her feet and drinking water, but that did not seem to help. She denies any VB, contractions or LOF. She states that the fetus has been moving less today than usual. She states that she has been feeling movement since she has been here. She denies any HA, visual disturbances or RUQ pain. She denies any history of pre-eclampsia.     Past Medical History:  Diagnosis Date  . Abnormal Pap smear   . Chlamydia   . Ectopic pregnancy   . Heart murmur    childhood  . Preterm labor   . Vaginal Pap smear, abnormal     Past Surgical History:  Procedure Laterality Date  . TOOTH EXTRACTION      Family History  Problem Relation Age of Onset  . Cancer Maternal Grandfather   . Prostate cancer Paternal Grandfather   . Anesthesia problems Neg Hx   . Alcohol abuse Neg Hx   . Arthritis Neg Hx   . Asthma Neg Hx   . Birth defects Neg Hx   . COPD Neg Hx   . Depression Neg Hx   . Diabetes Neg Hx   . Drug abuse Neg Hx   . Early death Neg Hx   . Hearing loss Neg Hx   . Heart disease Neg Hx   . Hyperlipidemia Neg Hx   . Hypertension Neg Hx   . Kidney disease Neg Hx   . Learning disabilities Neg Hx   . Mental illness Neg Hx   . Mental retardation Neg Hx   . Miscarriages / Stillbirths Neg Hx   . Stroke Neg Hx   . Vision loss Neg Hx   . Other Neg Hx     Social History  Substance Use Topics  . Smoking status: Never Smoker  . Smokeless tobacco: Never Used  . Alcohol use No     Comment: Special occas. when not pregnant    Allergies: No Known Allergies  Facility-Administered Medications Prior to  Admission  Medication Dose Route Frequency Provider Last Rate Last Dose  . Tdap (BOOSTRIX) injection 0.5 mL  0.5 mL Intramuscular Once Constant, Peggy, MD       Prescriptions Prior to Admission  Medication Sig Dispense Refill Last Dose  . Prenatal Vit-Fe Fumarate-FA (NAT-RUL PRENATAL VITAMINS) 28-0.8 MG TABS Take by mouth.   01/12/2017 at Unknown time    Review of Systems  Constitutional: Negative for chills and fever.  Eyes: Negative for visual disturbance.  Gastrointestinal: Negative for abdominal pain, nausea and vomiting.  Genitourinary: Negative for pelvic pain, vaginal bleeding and vaginal discharge.  Neurological: Positive for dizziness (earlier tonight, but that has resloved now. No LOC. ). Negative for headaches.   Physical Exam   Blood pressure 119/63, pulse 76, temperature 98 F (36.7 C), temperature source Oral, resp. rate 20, height  (1.727 m), weight 234 lb (106.1 kg), last menstrual period 05/15/2016.  Physical Exam  Nursing note and vitals reviewed. Constitutional: She is oriented to person, place, and time. She appears well-developed and well-nourished. No distress.  Cardiovascular: Normal rate.   Respiratory: Effort normal.  GI: Soft. There is no tenderness. There is no rebound.  Genitourinary:  Genitourinary Comments: 1/thick/ballotable/vtx  Musculoskeletal: She exhibits edema (to about mid-calf).  Neurological: She is alert and oriented to person, place, and time. She has normal reflexes. She exhibits normal muscle tone (no clonus ).  Skin: Skin is warm and dry.  Psychiatric: She has a normal mood and affect.     FHT: 130, moderate with 15x15 accels, no decels Toco: no UCs  Results for orders placed or performed during the hospital encounter of 01/12/17 (from the past 24 hour(s))  Urinalysis, Routine w reflex microscopic     Status: Abnormal   Collection Time: 01/12/17 12:31 AM  Result Value Ref Range   Color, Urine STRAW (A) YELLOW   APPearance  CLEAR CLEAR   Specific Gravity, Urine 1.003 (L) 1.005 - 1.030   pH 7.0 5.0 - 8.0   Glucose, UA NEGATIVE NEGATIVE mg/dL   Hgb urine dipstick NEGATIVE NEGATIVE   Bilirubin Urine NEGATIVE NEGATIVE   Ketones, ur NEGATIVE NEGATIVE mg/dL   Protein, ur NEGATIVE NEGATIVE mg/dL   Nitrite NEGATIVE NEGATIVE   Leukocytes, UA NEGATIVE NEGATIVE    MAU Course  Procedures  MDM Patient with edema to mid-calf. No other concerning sx and normal blood pressure. Reactive NST. Patient reassured, recommend increase fluids and elevate feet. Has an ROB later today. Will FU at that time.  Assessment and Plan   1. Edema during pregnancy in third trimester   2. Decreased fetal movements in third trimester, single or unspecified fetus   3. NST (non-stress test) reactive    DC home Comfort measures reviewed  3rd Trimester precautions  PTL precautions  Fetal kick counts RX: none  Return to MAU as needed FU with OB as planned  Follow-up Information    [facility] [place] Follow up.   Specialty:  Obstetrics and Gynecology Contact information: [address], [address] [place] [address] [phone]           Thressa Sheller 01/12/2017, 1:04 AM

## 2017-01-12 NOTE — MAU Note (Addendum)
PT SAYS  FEET SWOLLEN  X1 WEEK .- ELEVATED AND DRANK WATER,   FELT  DIZZY  TONIGHT -   LAST BABY MOVEMENT  WAS 3-4 PM  . [facility]- IN TRIAGE - 144.  HAS AN APPOINTMENT TODAY

## 2017-01-12 NOTE — Progress Notes (Signed)
Patient reports good fetal movement. Pt states that this morning she felt light headed and her feet were really swollen. Pt went to hospital and swelling has since gone down.

## 2017-01-14 ENCOUNTER — Encounter (HOSPITAL_COMMUNITY): Payer: Self-pay | Admitting: Obstetrics and Gynecology

## 2017-01-14 ENCOUNTER — Inpatient Hospital Stay (HOSPITAL_COMMUNITY)
Admission: AD | Admit: 2017-01-14 | Discharge: 2017-01-15 | DRG: 778 | Disposition: A | Discharge status: Home / Self Care | Payer: Medicaid Other | Source: Ambulatory Visit | Attending: Obstetrics & Gynecology | Admitting: Obstetrics & Gynecology

## 2017-01-14 DIAGNOSIS — Z3A34 34 weeks gestation of pregnancy: Secondary | ICD-10-CM

## 2017-01-14 DIAGNOSIS — O09899 Supervision of other high risk pregnancies, unspecified trimester: Secondary | ICD-10-CM

## 2017-01-14 DIAGNOSIS — O09219 Supervision of pregnancy with history of pre-term labor, unspecified trimester: Secondary | ICD-10-CM

## 2017-01-14 DIAGNOSIS — O09213 Supervision of pregnancy with history of pre-term labor, third trimester: Secondary | ICD-10-CM | POA: Diagnosis not present

## 2017-01-14 DIAGNOSIS — O99820 Streptococcus B carrier state complicating pregnancy: Secondary | ICD-10-CM | POA: Diagnosis present

## 2017-01-14 DIAGNOSIS — O4703 False labor before 37 completed weeks of gestation, third trimester: Secondary | ICD-10-CM

## 2017-01-14 LAB — CBC
HEMATOCRIT: 33.3 % — AB (ref 36.0–46.0)
HEMOGLOBIN: 11.4 g/dL — AB (ref 12.0–15.0)
MCH: 30.9 pg (ref 26.0–34.0)
MCHC: 34.2 g/dL (ref 30.0–36.0)
MCV: 90.2 fL (ref 78.0–100.0)
Platelets: 162 10*3/uL (ref 150–400)
RBC: 3.69 MIL/uL — AB (ref 3.87–5.11)
RDW: 13.3 % (ref 11.5–15.5)
WBC: 8.1 10*3/uL (ref 4.0–10.5)

## 2017-01-14 LAB — URINALYSIS, ROUTINE W REFLEX MICROSCOPIC
BACTERIA UA: NONE SEEN
Bilirubin Urine: NEGATIVE
Glucose, UA: NEGATIVE mg/dL
Ketones, ur: NEGATIVE mg/dL
Leukocytes, UA: NEGATIVE
NITRITE: NEGATIVE
PH: 8 (ref 5.0–8.0)
Protein, ur: NEGATIVE mg/dL
SPECIFIC GRAVITY, URINE: 1.004 — AB (ref 1.005–1.030)
Squamous Epithelial / HPF: NONE SEEN

## 2017-01-14 LAB — TYPE AND SCREEN
ABO/RH(D): O POS
ANTIBODY SCREEN: NEGATIVE

## 2017-01-14 LAB — RPR: RPR: NONREACTIVE

## 2017-01-14 LAB — OB RESULTS CONSOLE GBS: GBS: POSITIVE

## 2017-01-14 MED ORDER — SOD CITRATE-CITRIC ACID 500-334 MG/5ML PO SOLN: 30.0000 mL | ORAL | Status: DC | PRN

## 2017-01-14 MED ORDER — PRENATAL MULTIVITAMIN CH
1.0000 | ORAL_TABLET | Freq: Every day | ORAL | Status: DC
  Administered 2017-01-15: 1 via ORAL
  Filled 2017-01-14: qty 1

## 2017-01-14 MED ORDER — PENICILLIN G POT IN DEXTROSE 60000 UNIT/ML IV SOLN
3.0000 10*6.[IU] | INTRAVENOUS | Status: DC
  Administered 2017-01-14 – 2017-01-15 (×6): 3 10*6.[IU] via INTRAVENOUS
  Filled 2017-01-14 (×7): qty 50

## 2017-01-14 MED ORDER — FENTANYL CITRATE (PF) 100 MCG/2ML IJ SOLN
50.0000 ug | INTRAMUSCULAR | Status: DC | PRN
  Administered 2017-01-14: 100 ug via INTRAVENOUS
  Filled 2017-01-14: qty 2

## 2017-01-14 MED ORDER — TERBUTALINE SULFATE 1 MG/ML IJ SOLN
0.2500 mg | Freq: Once | INTRAMUSCULAR | Status: AC
  Administered 2017-01-14: 0.25 mg via SUBCUTANEOUS
  Filled 2017-01-14: qty 1

## 2017-01-14 MED ORDER — FENTANYL CITRATE (PF) 100 MCG/2ML IJ SOLN
50.0000 ug | Freq: Once | INTRAMUSCULAR | Status: DC
  Filled 2017-01-14: qty 2

## 2017-01-14 MED ORDER — LACTATED RINGERS IV SOLN
INTRAVENOUS | Status: DC
  Administered 2017-01-14 (×2): via INTRAVENOUS

## 2017-01-14 MED ORDER — FLEET ENEMA 7-19 GM/118ML RE ENEM: 1.0000 | ENEMA | RECTAL | Status: DC | PRN

## 2017-01-14 MED ORDER — LACTATED RINGERS IV BOLUS (SEPSIS)
1000.0000 mL | Freq: Once | INTRAVENOUS | Status: AC
  Administered 2017-01-14: 1000 mL via INTRAVENOUS

## 2017-01-14 MED ORDER — OXYCODONE-ACETAMINOPHEN 5-325 MG PO TABS: 1.0000 | ORAL_TABLET | ORAL | Status: DC | PRN

## 2017-01-14 MED ORDER — NIFEDIPINE 10 MG PO CAPS: 10.0000 mg | ORAL_CAPSULE | Freq: Four times a day (QID) | ORAL | Status: DC

## 2017-01-14 MED ORDER — OXYTOCIN BOLUS FROM INFUSION: 500.0000 mL | Freq: Once | INTRAVENOUS | Status: DC

## 2017-01-14 MED ORDER — OXYCODONE-ACETAMINOPHEN 5-325 MG PO TABS: 2.0000 | ORAL_TABLET | ORAL | Status: DC | PRN

## 2017-01-14 MED ORDER — BETAMETHASONE SOD PHOS & ACET 6 (3-3) MG/ML IJ SUSP
12.0000 mg | Freq: Once | INTRAMUSCULAR | Status: AC
  Administered 2017-01-15: 12 mg via INTRAMUSCULAR
  Filled 2017-01-14: qty 2

## 2017-01-14 MED ORDER — ZOLPIDEM TARTRATE 5 MG PO TABS
5.0000 mg | ORAL_TABLET | Freq: Every evening | ORAL | Status: DC | PRN
  Administered 2017-01-15: 5 mg via ORAL
  Filled 2017-01-14: qty 1

## 2017-01-14 MED ORDER — LACTATED RINGERS IV SOLN
500.0000 mL | INTRAVENOUS | Status: DC | PRN
  Administered 2017-01-15: 1000 mL via INTRAVENOUS

## 2017-01-14 MED ORDER — OXYTOCIN 40 UNITS IN LACTATED RINGERS INFUSION - SIMPLE MED: 2.5000 [IU]/h | INTRAVENOUS | Status: DC

## 2017-01-14 MED ORDER — NIFEDIPINE 10 MG PO CAPS
20.0000 mg | ORAL_CAPSULE | Freq: Once | ORAL | Status: AC
  Administered 2017-01-14: 20 mg via ORAL
  Filled 2017-01-14: qty 2

## 2017-01-14 MED ORDER — ACETAMINOPHEN 325 MG PO TABS: 650.0000 mg | ORAL_TABLET | ORAL | Status: DC | PRN

## 2017-01-14 MED ORDER — ONDANSETRON HCL 4 MG/2ML IJ SOLN: 4.0000 mg | Freq: Four times a day (QID) | INTRAMUSCULAR | Status: DC | PRN

## 2017-01-14 MED ORDER — NIFEDIPINE 10 MG PO CAPS
10.0000 mg | ORAL_CAPSULE | ORAL | Status: DC | PRN
  Filled 2017-01-14: qty 1

## 2017-01-14 MED ORDER — DOCUSATE SODIUM 100 MG PO CAPS: 100.0000 mg | ORAL_CAPSULE | Freq: Every day | ORAL | Status: DC | PRN

## 2017-01-14 MED ORDER — NIFEDIPINE ER OSMOTIC RELEASE 30 MG PO TB24
30.0000 mg | ORAL_TABLET | Freq: Every day | ORAL | Status: DC
  Administered 2017-01-14: 30 mg via ORAL
  Filled 2017-01-14: qty 1

## 2017-01-14 MED ORDER — LIDOCAINE HCL (PF) 1 % IJ SOLN
30.0000 mL | INTRAMUSCULAR | Status: DC | PRN
  Filled 2017-01-14: qty 30

## 2017-01-14 MED ORDER — BETAMETHASONE SOD PHOS & ACET 6 (3-3) MG/ML IJ SUSP
12.0000 mg | Freq: Once | INTRAMUSCULAR | Status: AC
  Administered 2017-01-14: 12 mg via INTRAMUSCULAR
  Filled 2017-01-14: qty 2

## 2017-01-14 MED ORDER — PENICILLIN G POTASSIUM 5000000 UNITS IJ SOLR
5.0000 10*6.[IU] | Freq: Once | INTRAMUSCULAR | Status: AC
  Administered 2017-01-14: 5 10*6.[IU] via INTRAVENOUS
  Filled 2017-01-14: qty 5

## 2017-01-14 MED ORDER — CALCIUM CARBONATE ANTACID 500 MG PO CHEW: 2.0000 | CHEWABLE_TABLET | ORAL | Status: DC | PRN

## 2017-01-14 NOTE — Progress Notes (Signed)
Patient resting comfortably in bed.  States she does feel contractions and is uncomfortable with them when they occur.  FHT: baseline 150, moderate variability, +accels, no decels TOCO: q 6-8 min  SVE: 4/50/-3   [id number] admitted for spontaneous onset of preterm labor.  No cervical change since admission. Will dose Procardia  now and then  q6hr for symptomatic relief (not tocolysis). GBS unk - culture pending, continue IV PCN.  S/p first dose of betamethasone, second dose due at 0330 (01/15/17)  Monitor on L&D x 90 minutes post first dose of Procardia and then transfer to Antepartum if no change in contraction pattern and bed available.    West Pugh, DO PGY-2 Family Medicine Resident

## 2017-01-14 NOTE — MAU Note (Signed)
Pt reports contractions on and off the today. States they are now every 3-4 mins for the last hour. Pt denies LOF. States she had some spotting today, but none now. Pt reports good fetal movement. States cervix was 1cm on last exam.

## 2017-01-14 NOTE — MAU Provider Note (Signed)
History     CSN: [id number]  Arrival date and time: 01/14/17 0132   None     Chief Complaint  Patient presents with  . Contractions   HPI Tanya Bailey is a 31 y.o. [id number] at [redacted]w[redacted]d who presents with contractions. Symptoms began 1 hour PTA. Reports contractions every 3-4 minutes that she rates 7/10. Has not treated pain. Denies n/v/d, recent intercourse, vaginal bleeding, or LOF. Positive fetal movement. Hx of PTD x 3.    OB History    Gravida Para Term Preterm AB Living   SAB TAB Ectopic Multiple Live Births   0 0 1 0 5      Past Medical History:  Diagnosis Date  . Abnormal Pap smear   . Chlamydia   . Ectopic pregnancy   . Heart murmur    childhood  . Preterm labor   . Vaginal Pap smear, abnormal     Past Surgical History:  Procedure Laterality Date  . TOOTH EXTRACTION      Family History  Problem Relation Age of Onset  . Cancer Maternal Grandfather   . Prostate cancer Paternal Grandfather   . Anesthesia problems Neg Hx   . Alcohol abuse Neg Hx   . Arthritis Neg Hx   . Asthma Neg Hx   . Birth defects Neg Hx   . COPD Neg Hx   . Depression Neg Hx   . Diabetes Neg Hx   . Drug abuse Neg Hx   . Early death Neg Hx   . Hearing loss Neg Hx   . Heart disease Neg Hx   . Hyperlipidemia Neg Hx   . Hypertension Neg Hx   . Kidney disease Neg Hx   . Learning disabilities Neg Hx   . Mental illness Neg Hx   . Mental retardation Neg Hx   . Miscarriages / Stillbirths Neg Hx   . Stroke Neg Hx   . Vision loss Neg Hx   . Other Neg Hx     Social History  Substance Use Topics  . Smoking status: Never Smoker  . Smokeless tobacco: Never Used  . Alcohol use No     Comment: Special occas. when not pregnant    Allergies: No Known Allergies  Facility-Administered Medications Prior to Admission  Medication Dose Route Frequency Provider Last Rate Last Dose  . Tdap (BOOSTRIX) injection 0.5 mL  0.5 mL Intramuscular Once Constant, Peggy, MD        Prescriptions Prior to Admission  Medication Sig Dispense Refill Last Dose  . Prenatal Vit-Fe Fumarate-FA (NAT-RUL PRENATAL VITAMINS) 28-0.8 MG TABS Take by mouth.   01/12/2017 at Unknown time    Review of Systems  Constitutional: Negative.   Gastrointestinal: Positive for abdominal pain.  Genitourinary: Negative.    Physical Exam   Dilation: 4 Effacement (%): 50 Station: -3 Presentation: Vertex Exam by:: E.Obie Kallenbach NP   Blood pressure 137/84, pulse 90, temperature 98.7 F (37.1 C), temperature source Oral, resp. rate 18, height  (1.727 m), weight 233 lb (105.7 kg), last menstrual period 05/15/2016, SpO2 100 %.  Physical Exam  Nursing note and vitals reviewed. Constitutional: She is oriented to person, place, and time. She appears well-developed and well-nourished. No distress.  HENT:  Head: Normocephalic and atraumatic.  Eyes: Conjunctivae are normal. Right eye exhibits no discharge. Left eye exhibits no discharge. No scleral icterus.  Neck: Normal range of motion.  Respiratory: Effort normal. No respiratory distress.  Neurological: She is alert and oriented to person, place, and time.  Skin: Skin is warm and dry. She is not diaphoretic.  Psychiatric: She has a normal mood and affect. Her behavior is normal. Judgment and thought content normal.   Fetal Tracing:  Baseline: 145 Variability: moderate Accelerations: 15x15 Decelerations: none  Toco: Q3-4 mins MAU Course  Procedures Results for orders placed or performed during the hospital encounter of 01/14/17 (from the past 24 hour(s))  Urinalysis, Routine w reflex microscopic     Status: Abnormal   Collection Time: 01/14/17  1:40 AM  Result Value Ref Range   Color, Urine STRAW (A) YELLOW   APPearance CLEAR CLEAR   Specific Gravity, Urine 1.004 (L) 1.005 - 1.030   pH 8.0 5.0 - 8.0   Glucose, UA NEGATIVE NEGATIVE mg/dL   Hgb urine dipstick SMALL (A) NEGATIVE   Bilirubin Urine NEGATIVE NEGATIVE   Ketones, ur  NEGATIVE NEGATIVE mg/dL   Protein, ur NEGATIVE NEGATIVE mg/dL   Nitrite NEGATIVE NEGATIVE   Leukocytes, UA NEGATIVE NEGATIVE   RBC / HPF 6-30 0 - 5 RBC/hpf   WBC, UA 0-5 0 - 5 WBC/hpf   Bacteria, UA NONE SEEN NONE SEEN   Squamous Epithelial / LPF NONE SEEN NONE SEEN    MDM Category 1 tracing. Ctx every 3-4 minutes. SVE changed from 1cm to 3 cm in an hour. Discussed with Dr. Despina Hidden. Will give IV fluid bolus, terburatline, & BMZ. Discussed plan with patient who is agreeable.  Patient given terb x 1 dose. Ctx soon returned & pt reports increase in pain. Slow cervical change 1 cm to 4 cm while in MAU. C/w Dr. Despina Hidden. Will admit for early labor.  Assessment and Plan  A: 1. Preterm uterine contractions in third trimester, antepartum   2. History of preterm delivery, currently pregnant   3. [redacted] weeks gestation of pregnancy    P: Admit to birthing suites GBS pending PCN per GBS protocol while pending IV pain medication prn  Judeth Horn 01/14/2017, 3:25 AM

## 2017-01-14 NOTE — H&P (Signed)
OBSTETRIC ADMISSION HISTORY AND PHYSICAL  Tanya Bailey is a 31 y.o. female [phone] with IUP at [redacted]w[redacted]d by LMP c/w 6 week U/S presenting for spontaneous onset of preterm labor. She reports contractions began approximately around 0200.  In MAU, her contractions are occurring every 3-4 minutes and her cervical exam changed from 1cm to 3cm in one hour.  She was given a dose of Terbutaline which did not slow the frequency of her contractions.  She received a dose of Betamethasone and was started on penicillin for GBS unknown status.  She has a history of pre-term labor.  She reports +FMs, No LOF, no VB, no blurry vision, headaches or peripheral edema, and no RUQ pain.  She plans on breast feeding. She request IUD for birth control. She received her prenatal care at [facility] [place].   Dating: By LMP / 6 week U/S --->  Estimated Date of Delivery: 02/19/17  Sono:    , CWD, normal anatomy, cephalic presentation, 851g, 60% EFW   Prenatal History/Complications:  Clinic [facility]  Prenatal Labs  Dating LMP, 6 week [place] 06/25/16 Blood type: O/Positive/-- (05/07 1051)   Genetic Screen 1 Screen:    AFP:     Quad:   negative  NIPS: Antibody:Negative (05/07 1051)  Anatomic [place] normal Rubella: 2.03 (05/07 1051)  GTT Early:               Third trimester: nl 2hr RPR: Non Reactive (07/26 1030)   Flu vaccine Declined HBsAg: Negative (05/07 1051)   TDaP vaccine   11/19/16                                       Rhogam: HIV:   Nonreactive (11/19/16)  Baby Food       breast                                        GBS: unk(For PCN allergy, check sensitivities)  Contraception      Mirena AVW:UJWJXB 08/2016  Circumcision     n/a  High Risk for previous preterm deliveries.  Last pregnancy delivered at term with weekly 17- P injections  Start weekly 17- P at [redacted] weeks gestation until 37 weeks  Pediatrician   Dr Jeanella Anton   Support Person Calvert Cantor      Past Medical History: Past Medical History:  Diagnosis Date  . Abnormal Pap  smear   . Chlamydia   . Ectopic pregnancy   . Heart murmur    childhood  . Preterm labor   . Vaginal Pap smear, abnormal     Past Surgical History: Past Surgical History:  Procedure Laterality Date  . TOOTH EXTRACTION      Obstetrical History: OB History    Gravida Para Term Preterm AB Living   SAB TAB Ectopic Multiple Live Births   0 0 1 0 5      Social History: Social History   Social History  . Marital status: Married    Spouse name: N/A  . Number of children: N/A  . Years of education: N/A   Social History Main Topics  . Smoking status: Never Smoker  . Smokeless tobacco: Never Used  . Alcohol use No     Comment: Special occas. when  not pregnant  . Drug use: No  . Sexual activity: Yes   Other Topics Concern  . None   Social History Narrative  . None    Family History: Family History  Problem Relation Age of Onset  . Cancer Maternal Grandfather   . Prostate cancer Paternal Grandfather   . Anesthesia problems Neg Hx   . Alcohol abuse Neg Hx   . Arthritis Neg Hx   . Asthma Neg Hx   . Birth defects Neg Hx   . COPD Neg Hx   . Depression Neg Hx   . Diabetes Neg Hx   . Drug abuse Neg Hx   . Early death Neg Hx   . Hearing loss Neg Hx   . Heart disease Neg Hx   . Hyperlipidemia Neg Hx   . Hypertension Neg Hx   . Kidney disease Neg Hx   . Learning disabilities Neg Hx   . Mental illness Neg Hx   . Mental retardation Neg Hx   . Miscarriages / Stillbirths Neg Hx   . Stroke Neg Hx   . Vision loss Neg Hx   . Other Neg Hx     Allergies: No Known Allergies  Facility-Administered Medications Prior to Admission  Medication Dose Route Frequency Provider Last Rate Last Dose  . Tdap (BOOSTRIX) injection 0.5 mL  0.5 mL Intramuscular Once Constant, Peggy, MD       Prescriptions Prior to Admission  Medication Sig Dispense Refill Last Dose  . Prenatal Vit-Fe Fumarate-FA (NAT-RUL PRENATAL VITAMINS) 28-0.8 MG TABS Take 1 tablet by mouth  daily.    01/13/2017 at Unknown time     Review of Systems   All systems reviewed and negative except as stated in HPI  Blood pressure 137/84, pulse 90, temperature 98.7 F (37.1 C), temperature source Oral, resp. rate 18, height  (1.727 m), weight 105.7 kg (233 lb), last menstrual period 05/15/2016, SpO2 100 %. General appearance: alert and cooperative Lungs: clear to auscultation bilaterally Heart: regular rate and rhythm Abdomen: soft, non-tender; bowel sounds normal Pelvic: deferred Extremities: Homans sign is negative, no sign of DVT Presentation: cephalic Fetal monitoringBaseline: 140 bpm, Variability: Good {> 6 bpm), Accelerations: Reactive and Decelerations: Absent Uterine activity: q 3-4 min Dilation: 4 Effacement (%): 50 Station: -3 Exam by:: Dr Doroteo Glassman    Prenatal labs: ABO, Rh: --/--/O POS (09/20 0340) Antibody: NEG (09/20 0340) Rubella: 2.03 (05/07 1051) RPR: Non Reactive (07/26 1030)  HBsAg: Negative (05/07 1051)  HIV:   Nonreactive GBS:   Unknown 2 hr Glucola WNL Genetic screening  Negative QUAD Anatomy US WNL female  Prenatal Transfer Tool  Maternal Diabetes: No Genetic Screening: Normal Maternal Ultrasounds/Referrals: Normal Fetal Ultrasounds or other Referrals:  None Maternal Substance Abuse:  No Significant Maternal Medications:  None Significant Maternal Lab Results: Lab values include: Other:  GBS unknown  Results for orders placed or performed during the hospital encounter of 01/14/17 (from the past 24 hour(s))  Urinalysis, Routine w reflex microscopic   Collection Time: 01/14/17  1:40 AM  Result Value Ref Range   Color, Urine STRAW (A) YELLOW   APPearance CLEAR CLEAR   Specific Gravity, Urine 1.004 (L) 1.005 - 1.030   pH 8.0 5.0 - 8.0   Glucose, UA NEGATIVE NEGATIVE mg/dL   Hgb urine dipstick SMALL (A) NEGATIVE   Bilirubin Urine NEGATIVE NEGATIVE   Ketones, ur NEGATIVE NEGATIVE mg/dL   Protein, ur NEGATIVE NEGATIVE mg/dL   Nitrite  NEGATIVE NEGATIVE   Leukocytes,  UA NEGATIVE NEGATIVE   RBC / HPF 6-30 0 - 5 RBC/hpf   WBC, UA 0-5 0 - 5 WBC/hpf   Bacteria, UA NONE SEEN NONE SEEN   Squamous Epithelial / LPF NONE SEEN NONE SEEN  CBC   Collection Time: 01/14/17  3:40 AM  Result Value Ref Range   WBC 8.1 4.0 - 10.5 K/uL   RBC 3.69 (L) 3.87 - 5.11 MIL/uL   Hemoglobin 11.4 (L) 12.0 - 15.0 g/dL   HCT 16.1 (L) 09.6 - 04.5 %   MCV 90.2 78.0 - 100.0 fL   MCH 30.9 26.0 - 34.0 pg   MCHC 34.2 30.0 - 36.0 g/dL   RDW 40.9 81.1 - 91.4 %   Platelets 162 150 - 400 K/uL  Type and screen [facility]   Collection Time: 01/14/17  3:40 AM  Result Value Ref Range   ABO/RH(D) O POS    Antibody Screen NEG    Sample Expiration 01/17/2017     Patient Active Problem List   Diagnosis Date Noted  . Indication for care in labor and delivery, antepartum 01/14/2017  . History of preterm delivery, currently pregnant 10/29/2016  . Supervision of high risk pregnancy, antepartum, third trimester 08/31/2016    Assessment/Plan:  Tanya Bailey is a 31 y.o. [id number] at [redacted]w[redacted]d here for spontaneous onset of preterm labor. Latent labor.   #Labor: Expectant management; s/p first dose of BMZ and PCN. #Pain: IV pain medications #FWB:  Category 1 #ID: GBS unknown; PCN initiated given preterm status, rapid PCR is pending #MOF:  Breast #MOC: IUD #Circ:  N/A  Larene Beach, DO PGY-2 Family Medicine Resident 01/14/2017, 9:38 AM   OB FELLOW HISTORY AND PHYSICAL ATTESTATION  I confirm that I have verified the information documented in the resident's note and that I have also personally reperformed the physical exam and all medical decision making activities. I agree with above documentation and have made edits as needed.   Caryl Ada OB Fellow

## 2017-01-14 NOTE — Anesthesia Pain Management Evaluation Note (Signed)
  CRNA Pain Management Visit Note  Patient: Tanya Bailey, 31 y.o., female  "Hello I am a member of the anesthesia team at [facility]. We have an anesthesia team available at all times to provide care throughout the hospital, including epidural management and anesthesia for C-section. I don't know your plan for the delivery whether it a natural birth, water birth, IV sedation, nitrous supplementation, doula or epidural, but we want to meet your pain goals."   1.Was your pain managed to your expectations on prior hospitalizations?   No   2.What is your expectation for pain management during this hospitalization?     Labor support without medications and IV pain meds  3.How can we help you reach that goal? Pt has had natural labor before and desires that this time.  Record the patient's initial score and the patient's pain goal.   Pain: 8  Pain Goal: 10 [facility] wants you to be able to say your pain was always managed very well.  Tanya Bailey 01/14/2017

## 2017-01-15 DIAGNOSIS — O4703 False labor before 37 completed weeks of gestation, third trimester: Secondary | ICD-10-CM

## 2017-01-15 LAB — CULTURE, BETA STREP (GROUP B ONLY)

## 2017-01-15 LAB — CULTURE, OB URINE: Culture: NO GROWTH

## 2017-01-15 NOTE — Progress Notes (Signed)
Pt discharged with printed instructions. Pt verbalized an understanding. No concerns noted. Yamileth Hayse L Valeriano Bain, RN 

## 2017-01-15 NOTE — Progress Notes (Signed)
Daily Antepartum Note  Admission Date: 01/14/2017 Current Date: 01/15/2017 7:06 AM  Tanya Bailey is a 31 y.o. [id number] @ [redacted]w[redacted]d, HD#2, admitted for PTL.  Pregnancy complicated by: PTB, h/o STIs, BMI 35  Overnight/24hr events:  none  Subjective:  Occasional UCs and no VB, LOF or decreased FM.   Objective:    Current Vital Signs 24h Vital Sign Ranges  T 98.5 F (36.9 C) Temp  Avg: 98.5 F (36.9 C)  Min: 98.4 F (36.9 C)  Max: 98.5 F (36.9 C)  BP 122/73 BP  Min: 112/71  Max: 127/72  HR 71 Pulse  Avg: 81.7  Min: 71  Max: 89  RR 18 Resp  Avg: 16.6  Min: 16  Max: 18  SaO2 100 % Not Delivered SpO2  Avg: 99.7 %  Min: 99 %  Max: 100 %       24 Hour I/O Current Shift I/O  Time Ins Outs No intake/output data recorded. No intake/output data recorded.   FHR: 130 baseline, +accels, no decel, mod variability Toco: rare UCs  Physical exam: General: Well nourished, well developed female in no acute distress. Abdomen: gravid, nttp, cephalic Cardiovascular: S1, S2 normal, no murmur, rub or gallop, regular rate and rhythm Respiratory: CTAB Extremities: no clubbing, cyanosis or edema Skin: Warm and dry.   SVE: 4/50/-3 @ 2100 on 9/21 per RN  Medications: Current Facility-Administered Medications  Medication Dose Route Frequency Provider Last Rate Last Dose  . acetaminophen (TYLENOL) tablet 650 mg  650 mg Oral Q4H PRN Judeth Horn, NP      . calcium carbonate (TUMS - dosed in mg elemental calcium) chewable tablet 400 mg of elemental calcium  2 tablet Oral Q4H PRN [place] Bing, MD      . docusate sodium (COLACE) capsule 100 mg  100 mg Oral Daily PRN [place] Bing, MD      . fentaNYL (SUBLIMAZE) injection 50-100 mcg  50-100 mcg Intravenous Q1H PRN Judeth Horn, NP   100 mcg at 01/14/17 0927  . lactated ringers infusion 500-1,000 mL  500-1,000 mL Intravenous PRN Judeth Horn, NP      . lactated ringers infusion   Intravenous Continuous Judeth Horn, NP 125 mL/hr at  01/14/17 2209    . lidocaine (PF) (XYLOCAINE) 1 % injection 30 mL  30 mL Subcutaneous PRN Judeth Horn, NP      . NIFEdipine (PROCARDIA-XL/ADALAT-CC/NIFEDICAL-XL) 24 hr tablet 30 mg  30 mg Oral QHS [place] Bing, MD   30 mg at 01/14/17 2101  . ondansetron (ZOFRAN) injection 4 mg  4 mg Intravenous Q6H PRN Judeth Horn, NP      . oxyCODONE-acetaminophen (PERCOCET/ROXICET) 5-325 MG per tablet 1 tablet  1 tablet Oral Q4H PRN Judeth Horn, NP      . oxyCODONE-acetaminophen (PERCOCET/ROXICET) 5-325 MG per tablet 2 tablet  2 tablet Oral Q4H PRN Judeth Horn, NP      . oxytocin (PITOCIN) IV BOLUS FROM BAG  500 mL Intravenous Once Judeth Horn, NP      . oxytocin (PITOCIN) IV infusion 40 units in LR 1000 mL - Premix  2.5 Units/hr Intravenous Continuous Judeth Horn, NP      . penicillin G potassium 3 Million Units in dextrose 50mL IVPB  3 Million Units Intravenous Q4H Judeth Horn, NP   Stopped at 01/15/17 0420  . prenatal multivitamin tablet 1 tablet  1 tablet Oral Q1200 [place] Bing, MD      . sodium citrate-citric acid (ORACIT) solution 30 mL  30 mL  Oral Q2H PRN Judeth Horn, NP      . sodium phosphate (FLEET) 7-19 GM/118ML enema 1 enema  1 enema Rectal PRN Judeth Horn, NP      . zolpidem (AMBIEN) tablet 5 mg  5 mg Oral QHS PRN [place] Bing, MD        Labs:   Recent Labs Lab 01/14/17 0340  WBC 8.1  HGB 11.4*  HCT 33.3*  PLT 162   Pending: UCx, GC/CT/trich pending  Radiology: no new imaging  Assessment & Plan:  Pt doing well *Pregnancy: fetal status reassuring *PTL: repeat SVE after NST today. If unchanged, I told her that it'd be reasonable for d/c to home. Continue procardia xl for symptomatic relief. BMZ 9/20 and 9/21. Follow up pending labs. Pt lives 5 minutes away *h/o PTB: declined 17p this pregnancy.  *GBS pos: tx if in labor *PPx: SCDs, OOB ad lib *FEN/GI: regular diet. Saline lock IV *Dispo: pending NST and SVE later this morning. Has 9/25  appt  Cornelia Copa MD Attending Center for [facility])

## 2017-01-15 NOTE — Discharge Summary (Signed)
Physician Discharge Summary  Patient ID: Tanya Bailey MRN: [id number] DOB/AGE: 1985/06/30 31 y.o.  Admit date: 01/14/2017 Discharge date: 01/15/2017  Admission Diagnoses:  Discharge Diagnoses:  Active Problems:   Indication for care in labor and delivery, antepartum   Discharged Condition: good  Hospital Course: IUP at [redacted]w[redacted]d by LMP c/w 6 week U/S presenting for spontaneous onset of preterm labor. She reports contractions began approximately around 0200.  In MAU, her contractions are occurring every 3-4 minutes and her cervical exam changed from 1cm to 3cm in one hour.  She was given a dose of Terbutaline which did not slow the frequency of her contractions No cervical change since admission, and I confirmed this by cervical exam. Consults: None  Significant Diagnostic Studies: labs:  CBC    Component Value Date/Time   WBC 8.1 01/14/2017 0340   RBC 3.69 (L) 01/14/2017 0340   HGB 11.4 (L) 01/14/2017 0340   HGB 11.8 11/19/2016 1030   HCT 33.3 (L) 01/14/2017 0340   HCT 34.7 11/19/2016 1030   PLT 162 01/14/2017 0340   PLT 148 (L) 11/19/2016 1030   MCV 90.2 01/14/2017 0340   MCV 92 11/19/2016 1030   MCH 30.9 01/14/2017 0340   MCHC 34.2 01/14/2017 0340   RDW 13.3 01/14/2017 0340   RDW 13.2 11/19/2016 1030   LYMPHSABS 1.5 08/31/2016 1051   MONOABS 0.4 08/18/2016 1925   EOSABS 0.1 08/31/2016 1051   BASOSABS 0.0 08/31/2016 1051     Treatments: IV hydration and procardia and terbutaline  Discharge Exam: Blood pressure (!) 106/51, pulse 77, temperature 99 F (37.2 C), temperature source Oral, resp. rate 18, height  (1.727 m), weight 105.7 kg (233 lb), last menstrual period 05/15/2016, SpO2 99 %. General appearance: alert, cooperative and no distress Cx posterior ballotable 3 cm Disposition: 01-Home or Self Care   Allergies as of 01/15/2017   No Known Allergies     Medication List    TAKE these medications   NAT-RUL PRENATAL VITAMINS 28-0.8 MG Tabs Take 1  tablet by mouth daily.      Follow-up Information    [facility] [place] Follow up on 01/19/2017.   Specialty:  Obstetrics and Gynecology Contact information: [address] [place] [address] [phone]          Signed: Scheryl Darter 01/15/2017, 1:00 PM

## 2017-01-17 ENCOUNTER — Inpatient Hospital Stay (HOSPITAL_COMMUNITY)
Admission: AD | Admit: 2017-01-17 | Discharge: 2017-01-17 | Disposition: A | Discharge status: Home / Self Care | Payer: Medicaid Other | Source: Ambulatory Visit | Attending: Obstetrics & Gynecology | Admitting: Obstetrics & Gynecology

## 2017-01-17 ENCOUNTER — Encounter (HOSPITAL_COMMUNITY): Payer: Self-pay

## 2017-01-17 DIAGNOSIS — O4703 False labor before 37 completed weeks of gestation, third trimester: Secondary | ICD-10-CM

## 2017-01-17 DIAGNOSIS — Z3A35 35 weeks gestation of pregnancy: Secondary | ICD-10-CM | POA: Diagnosis not present

## 2017-01-17 LAB — URINALYSIS, ROUTINE W REFLEX MICROSCOPIC
BILIRUBIN URINE: NEGATIVE
GLUCOSE, UA: NEGATIVE mg/dL
HGB URINE DIPSTICK: NEGATIVE
Ketones, ur: NEGATIVE mg/dL
NITRITE: NEGATIVE
PH: 7 (ref 5.0–8.0)
Protein, ur: NEGATIVE mg/dL
SPECIFIC GRAVITY, URINE: 1.019 (ref 1.005–1.030)

## 2017-01-17 MED ORDER — ACETAMINOPHEN 325 MG PO TABS
650.0000 mg | ORAL_TABLET | Freq: Four times a day (QID) | ORAL | Status: DC | PRN
  Administered 2017-01-17: 650 mg via ORAL
  Filled 2017-01-17: qty 2

## 2017-01-17 NOTE — MAU Note (Signed)
Feet have been been more swollen since yesterday. Headache started at 2 pm yesterday.  Hasn't taken Tylenol. Contractions since 3 am. No bleeding. No leaking. Baby moving well. 4 cm on Friday and was dc'd from hospital because they stopped my labor and I received steroid shots for baby's lungs.

## 2017-01-17 NOTE — MAU Provider Note (Signed)
Chief Complaint:  No chief complaint on file.   First Provider Initiated Contact with Patient 01/17/17 0545     HPI: Tanya Bailey is a 31 y.o. [id number] at 50w2dwho presents to maternity admissions reporting painful uterine contractions, mild headache and swollen feet.  Was 4cm on Friday and was sent home after tocolysis and steroids. . She reports good fetal movement, denies LOF, vaginal bleeding, vaginal itching/burning, urinary symptoms, dizziness, n/v, diarrhea, constipation or fever/chills.  She denies headache, visual changes or RUQ abdominal pain.  Abdominal Pain  This is a recurrent problem. The current episode started today. The onset quality is gradual. The problem occurs intermittently. The problem has been unchanged. The pain is located in the LLQ, RLQ and suprapubic region. The quality of the pain is cramping. The abdominal pain does not radiate. Associated symptoms include headaches. Pertinent negatives include no constipation, diarrhea, dysuria, fever, myalgias, nausea or vomiting. Associated symptoms comments: Feet swelling . Nothing aggravates the pain. The pain is relieved by nothing. She has tried nothing for the symptoms.    RN Note: Feet have been been more swollen since yesterday. Headache started at 2 pm yesterday.  Hasn't taken Tylenol. Contractions since 3 am. No bleeding. No leaking. Baby moving well. 4 cm on Friday and was dc'd from hospital because they stopped my labor and I received steroid shots for baby's lungs  Past Medical History: Past Medical History:  Diagnosis Date  . Abnormal Pap smear   . Chlamydia   . Ectopic pregnancy   . Heart murmur    childhood  . Preterm labor   . Vaginal Pap smear, abnormal     Past obstetric history: OB History  Gravida Para Term Preterm AB Living  SAB TAB Ectopic Multiple Live Births  0 0 1 0 5    # Outcome Date GA Lbr Len/2nd Weight Sex Delivery Anes PTL Lv  7 Current           6 Term 02/11/14 [redacted]w[redacted]d  04:58 / 00:06 6 lb 12.3 oz (3.07 kg) F Vag-Spont EPI  LIV  5 Ectopic 07/26/12 [redacted]w[redacted]d         4 Preterm 03/22/08 [redacted]w[redacted]d   M Vag-Spont None  LIV     Birth Comments: 36 weeks  3 Preterm 12/26/06 [redacted]w[redacted]d   F Vag-Spont None  LIV     Birth Comments: 35 weeks  2 Preterm 12/04/04 [redacted]w[redacted]d   M Vag-Spont None  DEC     Birth Comments: 23 weeks, contractions and water broke.  1 Term 07/20/03 [redacted]w[redacted]d   F Vag-Spont   LIV      Past Surgical History: Past Surgical History:  Procedure Laterality Date  . TOOTH EXTRACTION      Family History: Family History  Problem Relation Age of Onset  . Cancer Maternal Grandfather   . Prostate cancer Paternal Grandfather   . Anesthesia problems Neg Hx   . Alcohol abuse Neg Hx   . Arthritis Neg Hx   . Asthma Neg Hx   . Birth defects Neg Hx   . COPD Neg Hx   . Depression Neg Hx   . Diabetes Neg Hx   . Drug abuse Neg Hx   . Early death Neg Hx   . Hearing loss Neg Hx   . Heart disease Neg Hx   . Hyperlipidemia Neg Hx   . Hypertension Neg Hx   . Kidney disease Neg Hx   . Learning disabilities  Neg Hx   . Mental illness Neg Hx   . Mental retardation Neg Hx   . Miscarriages / Stillbirths Neg Hx   . Stroke Neg Hx   . Vision loss Neg Hx   . Other Neg Hx     Social History: Social History  Substance Use Topics  . Smoking status: Never Smoker  . Smokeless tobacco: Never Used  . Alcohol use No     Comment: Special occas. when not pregnant    Allergies: No Known Allergies  Meds:  Prescriptions Prior to Admission  Medication Sig Dispense Refill Last Dose  . Prenatal Vit-Fe Fumarate-FA (NAT-RUL PRENATAL VITAMINS) 28-0.8 MG TABS Take 1 tablet by mouth daily.    01/16/2017 at Unknown time    I have reviewed patient's Past Medical Hx, Surgical Hx, Family Hx, Social Hx, medications and allergies.   ROS:  Review of Systems  Constitutional: Negative for fever.  Gastrointestinal: Positive for abdominal pain. Negative for constipation, diarrhea, nausea and  vomiting.  Genitourinary: Negative for dysuria.  Musculoskeletal: Negative for myalgias.  Neurological: Positive for headaches.   Other systems negative  Physical Exam  Patient Vitals for the past 24 hrs:  BP Temp Temp src Pulse Resp SpO2  01/17/17 0536 - - - - - 100 %  01/17/17 0532 128/77 - - 73 - -  01/17/17 0531 - - - - - 100 %  01/17/17 0526 124/75 97.7 F (36.5 C) Oral 75 18 99 %   Vitals:   01/17/17 0526 01/17/17 0531 01/17/17 0532 01/17/17 0536  BP: 124/75  128/77   Pulse: 75  73   Resp: 18     Temp: 97.7 F (36.5 C)     TempSrc: Oral     SpO2: 99% 100%  100%    Constitutional: Well-developed, well-nourished female in no acute distress.  Cardiovascular: normal rate and rhythm Respiratory: normal effort, clear to auscultation bilaterally GI: Abd soft, non-tender, gravid appropriate for gestational age.   No rebound or guarding. MS: Extremities nontender, 1+ pedal edema, normal ROM Neurologic: Alert and oriented x 4.  GU: Neg CVAT.  PELVIC EXAM: Dilation: 4 Effacement (%): 60 Station: -3 Exam by:: Kehinde Bowdish cnm   FHT:  Baseline 140 , moderate variability, accelerations present, no decelerations Contractions: q 3-5 mins Irregular     Labs: Results for orders placed or performed during the hospital encounter of 01/17/17 (from the past 24 hour(s))  Urinalysis, Routine w reflex microscopic     Status: Abnormal   Collection Time: 01/17/17  5:14 AM  Result Value Ref Range   Color, Urine YELLOW YELLOW   APPearance CLEAR CLEAR   Specific Gravity, Urine 1.019 1.005 - 1.030   pH 7.0 5.0 - 8.0   Glucose, UA NEGATIVE NEGATIVE mg/dL   Hgb urine dipstick NEGATIVE NEGATIVE   Bilirubin Urine NEGATIVE NEGATIVE   Ketones, ur NEGATIVE NEGATIVE mg/dL   Protein, ur NEGATIVE NEGATIVE mg/dL   Nitrite NEGATIVE NEGATIVE   Leukocytes, UA MODERATE (A) NEGATIVE   RBC / HPF 0-5 0 - 5 RBC/hpf   WBC, UA 6-30 0 - 5 WBC/hpf   Bacteria, UA RARE (A) NONE SEEN   Squamous  Epithelial / LPF 0-5 (A) NONE SEEN   Mucus PRESENT    --/--/O POS (09/20 0340)  Imaging:  No results found.  MAU Course/MDM: I have ordered labs and reviewed results. Urine culture 01/14/17 No Growth.  UA is remarkable only for Leukocytes NST reviewed Consult Dr Macon Large with presentation, exam  findings and test results.  Treatments in MAU included EFM Cervix changed a little after one hour.  Dr Macon Large recommends observing one more hour.  Would not augment labor..   No change after second hour  Assessment: Single IUP at [redacted]w[redacted]d Preterm uterine contractions  Plan: Discharge home PTL precautions   Wynelle Bourgeois CNM, MSN Certified Nurse-Midwife 01/17/2017 5:45 AM

## 2017-01-17 NOTE — Discharge Instructions (Signed)

## 2017-01-19 ENCOUNTER — Ambulatory Visit (INDEPENDENT_AMBULATORY_CARE_PROVIDER_SITE_OTHER): Payer: Medicaid Other | Admitting: Certified Nurse Midwife

## 2017-01-19 ENCOUNTER — Other Ambulatory Visit (HOSPITAL_COMMUNITY)
Admission: RE | Admit: 2017-01-19 | Discharge: 2017-01-19 | Disposition: A | Discharge status: Home / Self Care | Payer: Medicaid Other | Source: Ambulatory Visit | Attending: Certified Nurse Midwife | Admitting: Certified Nurse Midwife

## 2017-01-19 DIAGNOSIS — O0993 Supervision of high risk pregnancy, unspecified, third trimester: Secondary | ICD-10-CM

## 2017-01-19 DIAGNOSIS — B951 Streptococcus, group B, as the cause of diseases classified elsewhere: Secondary | ICD-10-CM | POA: Insufficient documentation

## 2017-01-19 HISTORY — DX: Streptococcus, group b, as the cause of diseases classified elsewhere: B95.1

## 2017-01-19 NOTE — Addendum Note (Signed)
Addended by: Natale Milch D on: 01/19/2017 04:47 PM   Modules accepted: Orders

## 2017-01-19 NOTE — Progress Notes (Signed)
Patient reports that good fetal movement and reports that she has been having irregular contractions every 5-10 minutes with increased pressure. Pt states that early this morning she had a moderate about of bleeding that has since stopped.

## 2017-01-19 NOTE — Progress Notes (Signed)
   PRENATAL VISIT NOTE  Subjective:  Tanya Bailey is a 31 y.o. [phone] at 101w4d being seen today for ongoing prenatal care.  She is currently monitored for the following issues for this high-risk pregnancy and has Supervision of high risk pregnancy, antepartum, third trimester; History of preterm delivery, currently pregnant; Indication for care in labor and delivery, antepartum; and Positive GBS test on her problem list.  Patient reports no leaking, occasional contractions and brown spotting when wiping this morning.  Contractions: Irregular. Vag. Bleeding: Moderate.  Movement: Present. Denies leaking of fluid.   The following portions of the patient's history were reviewed and updated as appropriate: allergies, current medications, past family history, past medical history, past social history, past surgical history and problem list. Problem list updated.  Objective:   Vitals:   01/19/17 1556  BP: 126/83  Pulse: 84  Weight: 238 lb 9.6 oz (108.2 kg)    Fetal Status:     Movement: Present  Presentation: Vertex  General:  Alert, oriented and cooperative. Patient is in no acute distress.  Skin: Skin is warm and dry. No rash noted.   Cardiovascular: Normal heart rate noted  Respiratory: Normal respiratory effort, no problems with respiration noted  Abdomen: Soft, gravid, appropriate for gestational age.  Pain/Pressure: Present     Pelvic: Cervical exam performed Dilation: Closed Effacement (%): 50 Station: -3, very soft  Extremities: Normal range of motion.  Edema: Mild pitting, slight indentation  Mental Status:  Normal mood and affect. Normal behavior. Normal judgment and thought content.   Assessment and Plan:  Pregnancy: [id number] at [redacted]w[redacted]d  1. Supervision of high risk pregnancy, antepartum, third trimester      Possible early labor symptoms.  S/P BMZ X 2.  Discussed labor s/s, precautions given.       Hx of declining 17-P  2. Positive GBS test     PCN for  labor/delivery  Preterm labor symptoms and general obstetric precautions including but not limited to vaginal bleeding, contractions, leaking of fluid and fetal movement were reviewed in detail with the patient. Please refer to After Visit Summary for other counseling recommendations.  Return in about 1 week (around 01/26/2017) for ROB.   Roe Coombs, CNM

## 2017-01-20 ENCOUNTER — Inpatient Hospital Stay (HOSPITAL_COMMUNITY): Payer: Medicaid Other

## 2017-01-20 ENCOUNTER — Inpatient Hospital Stay (HOSPITAL_COMMUNITY)
Admission: AD | Admit: 2017-01-20 | Discharge: 2017-01-22 | DRG: 775 | Disposition: A | Discharge status: Home / Self Care | Payer: Medicaid Other | Source: Ambulatory Visit | Attending: Family Medicine | Admitting: Family Medicine

## 2017-01-20 ENCOUNTER — Encounter (HOSPITAL_COMMUNITY): Payer: Self-pay

## 2017-01-20 DIAGNOSIS — O99824 Streptococcus B carrier state complicating childbirth: Secondary | ICD-10-CM | POA: Diagnosis present

## 2017-01-20 DIAGNOSIS — O4693 Antepartum hemorrhage, unspecified, third trimester: Secondary | ICD-10-CM | POA: Diagnosis present

## 2017-01-20 DIAGNOSIS — Z3A35 35 weeks gestation of pregnancy: Secondary | ICD-10-CM

## 2017-01-20 DIAGNOSIS — O368390 Maternal care for abnormalities of the fetal heart rate or rhythm, unspecified trimester, not applicable or unspecified: Secondary | ICD-10-CM

## 2017-01-20 LAB — COMPREHENSIVE METABOLIC PANEL
ALBUMIN: 3.1 g/dL — AB (ref 3.5–5.0)
ALK PHOS: 81 U/L (ref 38–126)
ALT: 11 U/L — ABNORMAL LOW (ref 14–54)
AST: 19 U/L (ref 15–41)
Anion gap: 11 (ref 5–15)
BILIRUBIN TOTAL: 0.7 mg/dL (ref 0.3–1.2)
CALCIUM: 8.6 mg/dL — AB (ref 8.9–10.3)
CO2: 24 mmol/L (ref 22–32)
CREATININE: 0.54 mg/dL (ref 0.44–1.00)
Chloride: 102 mmol/L (ref 101–111)
GFR calc Af Amer: >60 mL/min (ref >60)
GLUCOSE: 91 mg/dL (ref 65–99)
Potassium: 3.3 mmol/L — ABNORMAL LOW (ref 3.5–5.1)
Sodium: 137 mmol/L (ref 135–145)
TOTAL PROTEIN: 7 g/dL (ref 6.5–8.1)

## 2017-01-20 LAB — CBC
HEMATOCRIT: 33 % — AB (ref 36.0–46.0)
HEMOGLOBIN: 11.3 g/dL — AB (ref 12.0–15.0)
MCH: 30.4 pg (ref 26.0–34.0)
MCHC: 34.2 g/dL (ref 30.0–36.0)
MCV: 88.7 fL (ref 78.0–100.0)
Platelets: 149 10*3/uL — ABNORMAL LOW (ref 150–400)
RBC: 3.72 MIL/uL — ABNORMAL LOW (ref 3.87–5.11)
RDW: 13.3 % (ref 11.5–15.5)
WBC: 8.5 10*3/uL (ref 4.0–10.5)

## 2017-01-20 LAB — PROTEIN / CREATININE RATIO, URINE
Creatinine, Urine: 34 mg/dL
Total Protein, Urine: <6 mg/dL

## 2017-01-20 LAB — POCT FERN TEST: POCT Fern Test: NEGATIVE

## 2017-01-20 MED ORDER — LACTATED RINGERS IV SOLN: INTRAVENOUS | Status: DC

## 2017-01-20 MED ORDER — SIMETHICONE 80 MG PO CHEW: 80.0000 mg | CHEWABLE_TABLET | ORAL | Status: DC | PRN

## 2017-01-20 MED ORDER — IBUPROFEN 600 MG PO TABS
600.0000 mg | ORAL_TABLET | Freq: Four times a day (QID) | ORAL | Status: DC
  Administered 2017-01-20 – 2017-01-22 (×10): 600 mg via ORAL
  Filled 2017-01-20 (×10): qty 1

## 2017-01-20 MED ORDER — SOD CITRATE-CITRIC ACID 500-334 MG/5ML PO SOLN: 30.0000 mL | ORAL | Status: DC | PRN

## 2017-01-20 MED ORDER — ACETAMINOPHEN 325 MG PO TABS
650.0000 mg | ORAL_TABLET | ORAL | Status: DC | PRN
  Administered 2017-01-20 – 2017-01-22 (×5): 650 mg via ORAL
  Filled 2017-01-20 (×5): qty 2

## 2017-01-20 MED ORDER — PRENATAL MULTIVITAMIN CH
1.0000 | ORAL_TABLET | Freq: Every day | ORAL | Status: DC
  Administered 2017-01-20 – 2017-01-22 (×3): 1 via ORAL
  Filled 2017-01-20 (×3): qty 1

## 2017-01-20 MED ORDER — OXYTOCIN BOLUS FROM INFUSION: 500.0000 mL | Freq: Once | INTRAVENOUS | Status: DC

## 2017-01-20 MED ORDER — TETANUS-DIPHTH-ACELL PERTUSSIS 5-2.5-18.5 LF-MCG/0.5 IM SUSP: 0.5000 mL | Freq: Once | INTRAMUSCULAR | Status: DC

## 2017-01-20 MED ORDER — OXYCODONE-ACETAMINOPHEN 5-325 MG PO TABS: 2.0000 | ORAL_TABLET | ORAL | Status: DC | PRN

## 2017-01-20 MED ORDER — OXYTOCIN 10 UNIT/ML IJ SOLN
INTRAMUSCULAR | Status: AC
  Administered 2017-01-20: 10 [IU]
  Filled 2017-01-20: qty 1

## 2017-01-20 MED ORDER — ONDANSETRON HCL 4 MG/2ML IJ SOLN: 4.0000 mg | INTRAMUSCULAR | Status: DC | PRN

## 2017-01-20 MED ORDER — ACETAMINOPHEN 325 MG PO TABS: 650.0000 mg | ORAL_TABLET | ORAL | Status: DC | PRN

## 2017-01-20 MED ORDER — FLEET ENEMA 7-19 GM/118ML RE ENEM: 1.0000 | ENEMA | Freq: Once | RECTAL | Status: DC

## 2017-01-20 MED ORDER — DIPHENHYDRAMINE HCL 25 MG PO CAPS: 25.0000 mg | ORAL_CAPSULE | Freq: Four times a day (QID) | ORAL | Status: DC | PRN

## 2017-01-20 MED ORDER — SENNOSIDES-DOCUSATE SODIUM 8.6-50 MG PO TABS
2.0000 | ORAL_TABLET | ORAL | Status: DC
  Administered 2017-01-20: 2 via ORAL
  Filled 2017-01-20: qty 2

## 2017-01-20 MED ORDER — LACTATED RINGERS IV SOLN: 500.0000 mL | INTRAVENOUS | Status: DC | PRN

## 2017-01-20 MED ORDER — LIDOCAINE HCL (PF) 1 % IJ SOLN
INTRAMUSCULAR | Status: AC
  Administered 2017-01-20: 30 mL
  Filled 2017-01-20: qty 30

## 2017-01-20 MED ORDER — BENZOCAINE-MENTHOL 20-0.5 % EX AERO
1.0000 "application " | INHALATION_SPRAY | CUTANEOUS | Status: DC | PRN
  Administered 2017-01-22: 1 via TOPICAL
  Filled 2017-01-20 (×2): qty 56

## 2017-01-20 MED ORDER — OXYTOCIN 40 UNITS IN LACTATED RINGERS INFUSION - SIMPLE MED: 2.5000 [IU]/h | INTRAVENOUS | Status: DC

## 2017-01-20 MED ORDER — LIDOCAINE HCL (PF) 1 % IJ SOLN
30.0000 mL | INTRAMUSCULAR | Status: DC | PRN
  Filled 2017-01-20: qty 30

## 2017-01-20 MED ORDER — DIBUCAINE 1 % RE OINT: 1.0000 "application " | TOPICAL_OINTMENT | RECTAL | Status: DC | PRN

## 2017-01-20 MED ORDER — OXYCODONE-ACETAMINOPHEN 5-325 MG PO TABS: 1.0000 | ORAL_TABLET | ORAL | Status: DC | PRN

## 2017-01-20 MED ORDER — WITCH HAZEL-GLYCERIN EX PADS: 1.0000 "application " | MEDICATED_PAD | CUTANEOUS | Status: DC | PRN

## 2017-01-20 MED ORDER — ONDANSETRON HCL 4 MG PO TABS: 4.0000 mg | ORAL_TABLET | ORAL | Status: DC | PRN

## 2017-01-20 MED ORDER — ONDANSETRON HCL 4 MG/2ML IJ SOLN: 4.0000 mg | Freq: Four times a day (QID) | INTRAMUSCULAR | Status: DC | PRN

## 2017-01-20 MED ORDER — METHYLERGONOVINE MALEATE 0.2 MG/ML IJ SOLN
INTRAMUSCULAR | Status: AC
  Filled 2017-01-20: qty 1

## 2017-01-20 MED ORDER — ZOLPIDEM TARTRATE 5 MG PO TABS: 5.0000 mg | ORAL_TABLET | Freq: Every evening | ORAL | Status: DC | PRN

## 2017-01-20 MED ORDER — COCONUT OIL OIL: 1.0000 "application " | TOPICAL_OIL | Status: DC | PRN

## 2017-01-20 MED ORDER — OXYTOCIN 40 UNITS IN LACTATED RINGERS INFUSION - SIMPLE MED
INTRAVENOUS | Status: AC
  Filled 2017-01-20: qty 1000

## 2017-01-20 NOTE — MAU Note (Signed)
Pt returned from U/S, complaining of increased pain. CNM at bedside, cervical exam-10 cm. Transferred to L&D.

## 2017-01-20 NOTE — Lactation Note (Signed)
This note was copied from a baby's chart. Lactation Consultation Note  Patient Name: Tanya Bailey Date: 01/20/2017 Reason for consult: Initial assessment;Late-preterm 34-36.6wks  Visited with P6 Mom. Baby is 12 hrs old, and has been to the breast feeding twice, and 2 attempts. Mom started pumping today, choosing to pump one breast after baby breast feeds on the other.  Talked about benefits of double pumping with regards to her prolactin hormone.  Mom able to express 20 ml of colostrum at this 2nd pumping.   Mom states baby had just breastfed for 6-9 mins, and she felt baby swallowed at the breast.  Talked about normal LPTI newborn behavior.  Handout given.  Encouraged regular hand expression and double pumping (goal of >8 times per 24 hrs) Encouraged STS, and cue based feedings, at least every 3 hrs.  Mom to call prn for assistance. Baby to be supplemented with 5-10 ml EBM by bottle using the pace method of feeding (demonstrated this to Mom) increasing to 10-20 ml after 24 hrs of age.    Lactation brochure and LPTI handout given to Mom.       Consult Status Consult Status: Follow-up Date: 01/21/17 Follow-up type: In-patient    Judee Clara 01/20/2017, 6:08 PM

## 2017-01-20 NOTE — MAU Provider Note (Signed)
Patient Tanya Bailey is a 31 y.o. [phone] at [redacted]w[redacted]d  Here with complaints of contractions and vaginal bleeding for the past two days. She denies decreased fetal movements or abnormal discharge.   She was seen in the MAU on 01-17-2017 for contractions; she was given steroids and discharged after her cervix was unchanged at 4 cms.   She was seen for a prenatal visit on 01/19/2017 and told her provider about her vaginal bleeding. She was told that if it continued she should return to the MAU for evaluation.   She says that she has a lot of pressure in her back and in her privates.  History     CSN: [id number]  Arrival date and time: 01/20/17 0215   None     Chief Complaint  Patient presents with  . Contractions   Vaginal Bleeding  The patient's primary symptoms include pelvic pain and vaginal bleeding. This is a new problem. The current episode started yesterday. The problem occurs intermittently. The problem has been unchanged. Associated symptoms include abdominal pain. The vaginal discharge was bloody. The vaginal bleeding is spotting (she sees it when she wipes). She has not been passing clots. She has not been passing tissue.   Patient states that she has been having bleeding and contractions off and on throughout the past two days. She says that the bleeding is when she wipes; she has not had to change her underwear but she has changed her pantyliner times.   She does not want to be pregnant anymore; she desires delivery and thus is declining all interventions to stop delivery (procardia, terb, IV fluids, etc).   OB History    Gravida Para Term Preterm AB Living   SAB TAB Ectopic Multiple Live Births   0 0 1 0 5      Past Medical History:  Diagnosis Date  . Abnormal Pap smear   . Chlamydia   . Ectopic pregnancy   . Heart murmur    childhood  . Preterm labor   . Vaginal Pap smear, abnormal     Past Surgical History:  Procedure Laterality Date  . TOOTH  EXTRACTION      Family History  Problem Relation Age of Onset  . Cancer Maternal Grandfather   . Prostate cancer Paternal Grandfather   . Anesthesia problems Neg Hx   . Alcohol abuse Neg Hx   . Arthritis Neg Hx   . Asthma Neg Hx   . Birth defects Neg Hx   . COPD Neg Hx   . Depression Neg Hx   . Diabetes Neg Hx   . Drug abuse Neg Hx   . Early death Neg Hx   . Hearing loss Neg Hx   . Heart disease Neg Hx   . Hyperlipidemia Neg Hx   . Hypertension Neg Hx   . Kidney disease Neg Hx   . Learning disabilities Neg Hx   . Mental illness Neg Hx   . Mental retardation Neg Hx   . Miscarriages / Stillbirths Neg Hx   . Stroke Neg Hx   . Vision loss Neg Hx   . Other Neg Hx     Social History  Substance Use Topics  . Smoking status: Never Smoker  . Smokeless tobacco: Never Used  . Alcohol use No     Comment: Special occas. when not pregnant    Allergies: No Known Allergies  Prescriptions Prior to Admission  Medication Sig  Dispense Refill Last Dose  . Prenatal Vit-Fe Fumarate-FA (NAT-RUL PRENATAL VITAMINS) 28-0.8 MG TABS Take 1 tablet by mouth daily.    01/19/2017 at Unknown time    Review of Systems  HENT: Negative.   Respiratory: Negative.   Cardiovascular: Negative.   Gastrointestinal: Positive for abdominal pain.  Genitourinary: Positive for pelvic pain and vaginal bleeding.  Musculoskeletal: Negative.   Neurological: Negative.    Physical Exam   Blood pressure 140/87, pulse 73, temperature 98.3 F (36.8 C), temperature source Oral, resp. rate 18, last menstrual period 05/15/2016.  Physical Exam  Constitutional: She is oriented to person, place, and time. She appears well-developed and well-nourished.  HENT:  Head: Normocephalic.  GI: Soft. She exhibits no distension and no mass. There is no tenderness. There is no rebound and no guarding.  Genitourinary:  Genitourinary Comments: NEFG; no blood on speculum or glove. Cervix is 4 cm, 60%, soft. No blood in the  vaginal vault, no suprapubic or adnexal tenderness, or CMT.  Musculoskeletal: Normal range of motion.  Neurological: She is alert and oriented to person, place, and time. She has normal reflexes.  Skin: Skin is warm and dry.  Psychiatric: She has a normal mood and affect.    MAU Course  Procedures  MDM -NST  Patient now with elevated blood pressures in MAU; pre-e labs drawn.  Patient given PO hydration (1 cup of apple juice and 1 sprint).   Assessment and Plan  Patient became fully dilated and was transferred to L and D.   Charlesetta Garibaldi Kooistra 01/20/2017, 3:28 AM

## 2017-01-20 NOTE — MAU Note (Signed)
Pt presents to MAU with c/o contractions that started on Sunday but became constant at 0030 today. Pt has had minimal bleeding since Tuesday and mucous discharge. +FM

## 2017-01-20 NOTE — H&P (Signed)
Tanya Bailey is a 31 y.o. female [phone] with IUP at [redacted]w[redacted]d presenting for bleeding and contractions. Pt states she has been having irregular, every 5 minutes contractions, associated with spotting vaginal bleeding for 24 hours..  Membranes are intact, with active fetal movement.   [facility]  Prenatal History/Complications:  Past Medical History: Past Medical History:  Diagnosis Date  . Abnormal Pap smear   . Chlamydia   . Ectopic pregnancy   . Heart murmur    childhood  . Preterm labor   . Vaginal Pap smear, abnormal     Past Surgical History: Past Surgical History:  Procedure Laterality Date  . TOOTH EXTRACTION      Obstetrical History: OB History    Gravida Para Term Preterm AB Living   SAB TAB Ectopic Multiple Live Births   0 0 1 0 5       Social History: Social History   Social History  . Marital status: Married    Spouse name: N/A  . Number of children: N/A  . Years of education: N/A   Social History Main Topics  . Smoking status: Never Smoker  . Smokeless tobacco: Never Used  . Alcohol use No     Comment: Special occas. when not pregnant  . Drug use: No  . Sexual activity: Yes     Comment: Last intercourse-last week   Other Topics Concern  . None   Social History Narrative  . None    Family History: Family History  Problem Relation Age of Onset  . Cancer Maternal Grandfather   . Prostate cancer Paternal Grandfather   . Anesthesia problems Neg Hx   . Alcohol abuse Neg Hx   . Arthritis Neg Hx   . Asthma Neg Hx   . Birth defects Neg Hx   . COPD Neg Hx   . Depression Neg Hx   . Diabetes Neg Hx   . Drug abuse Neg Hx   . Early death Neg Hx   . Hearing loss Neg Hx   . Heart disease Neg Hx   . Hyperlipidemia Neg Hx   . Hypertension Neg Hx   . Kidney disease Neg Hx   . Learning disabilities Neg Hx   . Mental illness Neg Hx   . Mental retardation Neg Hx   . Miscarriages / Stillbirths Neg Hx   . Stroke  Neg Hx   . Vision loss Neg Hx   . Other Neg Hx     Allergies: No Known Allergies  Prescriptions Prior to Admission  Medication Sig Dispense Refill Last Dose  . Prenatal Vit-Fe Fumarate-FA (NAT-RUL PRENATAL VITAMINS) 28-0.8 MG TABS Take 1 tablet by mouth daily.    01/19/2017 at Unknown time        Review of Systems   Constitutional: Negative for fever and chills Eyes: Negative for visual disturbances Respiratory: Negative for shortness of breath, dyspnea Cardiovascular: Negative for chest pain or palpitations  Gastrointestinal: Negative for vomiting, diarrhea and constipation.  POSITIVE for abdominal pain (contractions) Genitourinary: Negative for dysuria and urgency Musculoskeletal: Negative for back pain, joint pain, myalgias  Neurological: Negative for dizziness and headaches      Blood pressure 135/85, pulse 88, temperature 98.3 F (36.8 C), temperature source Oral, resp. rate 18, last menstrual period 05/15/2016. General appearance: alert, cooperative and no distress Lungs: clear to auscultation bilaterally Heart: regular rate and rhythm Abdomen: soft, non-tender; bowel sounds normal Extremities:  Homans sign is negative, no sign of DVT DTR's 2+ Presentation: cephalic Fetal monitoring  Baseline: 150 bpm; mod variability, no decels, present ctx Uterine activity  q 4 min Dilation: 10 Effacement (%): 100 Exam by:: Crisoforo Oxford, CNM   Prenatal labs: ABO, Rh: --/--/O POS (09/20 0340) Antibody: NEG (09/20 0340) Rubella: !Error! RPR: Non Reactive (09/20 0340)  HBsAg: Negative (05/07 1051)  HIV:    GBS: Positive (09/20 0000)  1 hr Glucola normal Genetic screening  normal Anatomy US normal  Prenatal Transfer Tool  Maternal Diabetes: No Genetic Screening: Normal Maternal Ultrasounds/Referrals: Normal Fetal Ultrasounds or other Referrals:  None, Other: normal Maternal Substance Abuse:  No Significant Maternal Medications:  None Significant Maternal Lab Results:  None     Results for orders placed or performed during the hospital encounter of 01/20/17 (from the past 24 hour(s))  Fern Test   Collection Time: 01/20/17  3:10 AM  Result Value Ref Range   POCT Fern Test Negative = intact amniotic membranes   CBC   Collection Time: 01/20/17  3:46 AM  Result Value Ref Range   WBC 8.5 4.0 - 10.5 K/uL   RBC 3.72 (L) 3.87 - 5.11 MIL/uL   Hemoglobin 11.3 (L) 12.0 - 15.0 g/dL   HCT 29.5 (L) 62.1 - 30.8 %   MCV 88.7 78.0 - 100.0 fL   MCH 30.4 26.0 - 34.0 pg   MCHC 34.2 30.0 - 36.0 g/dL   RDW 65.7 84.6 - 96.2 %   Platelets 149 (L) 150 - 400 K/uL  Comprehensive metabolic panel   Collection Time: 01/20/17  3:46 AM  Result Value Ref Range   Sodium 137 135 - 145 mmol/L   Potassium 3.3 (L) 3.5 - 5.1 mmol/L   Chloride 102 101 - 111 mmol/L   CO2 24 22 - 32 mmol/L   Glucose, Bld 91 65 - 99 mg/dL   BUN <5 (L) 6 - 20 mg/dL   Creatinine, Ser 9.52 0.44 - 1.00 mg/dL   Calcium 8.6 (L) 8.9 - 10.3 mg/dL   Total Protein 7.0 6.5 - 8.1 g/dL   Albumin 3.1 (L) 3.5 - 5.0 g/dL   AST 19 15 - 41 U/L   ALT 11 (L) 14 - 54 U/L   Alkaline Phosphatase 81 38 - 126 U/L   Total Bilirubin 0.7 0.3 - 1.2 mg/dL   GFR calc non Af Amer >60 >60 mL/min   GFR calc Af Amer >60 >60 mL/min   Anion gap 11 5 - 15  Protein / creatinine ratio, urine   Collection Time: 01/20/17  4:05 AM  Result Value Ref Range   Creatinine, Urine 34.00 mg/dL   Total Protein, Urine <6 mg/dL   Protein Creatinine Ratio        0.00 - 0.15 mg/mg[Cre]    Assessment: Tanya Bailey is a 31 y.o. [id number] with an IUP at [redacted]w[redacted]d presenting for evaluation for bleeding and contractions.   Plan: #Labor: expectant management #Pain:  Per request #FWB Cat 1 #ID: GBS: positive #MOF:  breast #MOC: mirena #Circ: NA   Charlesetta Garibaldi Freman Lapage CNM 01/20/2017, 6:12 AM

## 2017-01-21 LAB — CERVICOVAGINAL ANCILLARY ONLY
BACTERIAL VAGINITIS: NEGATIVE
Candida vaginitis: NEGATIVE
Chlamydia: NEGATIVE
NEISSERIA GONORRHEA: NEGATIVE
Trichomonas: NEGATIVE

## 2017-01-21 LAB — BIRTH TISSUE RECOVERY COLLECTION (PLACENTA DONATION)

## 2017-01-21 NOTE — Progress Notes (Signed)
Post Partum Day 1  Subjective:  Tanya Bailey is a 31 y.o. [id number] [redacted]w[redacted]d s/p NSVD.  No acute events overnight.  Pt denies problems with ambulating, voiding or po intake.  She denies nausea or vomiting.  Pain is well controlled.  She has had flatus. She has not had bowel movement.  Lochia Small.  Plan for birth control is IUD.  Method of Feeding: breast  Objective: BP 127/83   Pulse 75   Temp 98.6 F (37 C) (Oral)   Resp 20   LMP 05/15/2016   Breastfeeding? Unknown   Physical Exam:  General: alert, cooperative and no distress Lochia:normal flow Chest: no increased work of breathing Abdomen: soft, nontender, fundus firm at/below umbilicus Uterine Fundus: firm DVT Evaluation: No evidence of DVT seen on physical exam. Extremities: No edema   Recent Labs  01/20/17 0346  HGB 11.3*  HCT 33.0*    Assessment/Plan:  ASSESSMENT: Tanya Bailey is a 31 y.o. [id number] [redacted]w[redacted]d ppd #1 s/p NSVD doing well.   Plan for discharge tomorrow   LOS: 1 day   Amanda C. Frances Furbish, MD PGY-1, [facility] 01/21/2017 7:43 AM  OB FELLOW POSTPARTUM PROGRESS NOTE ATTESTATION  I have seen and examined this patient and agree with above documentation in the resident's note.   Frederik Pear, MD OB Fellow 11:06 AM

## 2017-01-22 LAB — RPR: RPR: NONREACTIVE

## 2017-01-22 MED ORDER — ACETAMINOPHEN 325 MG PO TABS: 650.0000 mg | ORAL_TABLET | ORAL | 0 refills | Status: DC | PRN

## 2017-01-22 MED ORDER — IBUPROFEN 600 MG PO TABS: 600.0000 mg | ORAL_TABLET | Freq: Four times a day (QID) | ORAL | 0 refills | Status: DC

## 2017-01-22 NOTE — Discharge Summary (Signed)
OB Discharge Summary     Patient Name: Tanya Bailey DOB: [birth year] MRN: [id number]  Date of admission: 01/20/2017 Delivering MD:    Chiyoko, Torrico [[id number]]      Rill, Girl Shamonica [[id number]]  Caryl Ada Y   Date of discharge: 01/22/2017  Admitting diagnosis: 35 WEEKS CTX BLEEDING Intrauterine pregnancy: [redacted]w[redacted]d     Secondary diagnosis:  Active Problems:   Indication for care in labor and delivery, antepartum   SVD (spontaneous vaginal delivery)  Additional problems: none     Discharge diagnosis: Term Pregnancy Delivered                                                                                                Post partum procedures:none  Augmentation: none  Complications: None  Hospital course:  Onset of Labor With Vaginal Delivery     31 y.o. yo [id number] at [redacted]w[redacted]d was admitted in Latent Labor on 01/20/2017. Patient had an uncomplicated labor course as follows:  Membrane Rupture Time/Date:    Sehar, Sedano [[id number]]      XLKGMWN, Girl Mychelle [[id number]]  6:12 AM ,   Rubina, Basinski [[id number]]      Shonita, Rinck Girl Carolan [[id number]]  01/20/2017   Intrapartum Procedures: Episiotomy:    Madisynn, Plair [[id number]]      Uhrich, Girl Starlena [[id number]]  None [1]                                         Lacerations:     Melita, Villalona [[id number]]      Lecuyer, Girl Allaya [[id number]]  Labial [10]  Patient had a delivery of a Viable infant.   Taniqua, Issa [[id number]]    Latitia, Housewright Girl Merlene [[id number]]  01/20/2017 Information for the patient's newborn:  Kelilah, Hebard [[id number]]    Information for the patient's newborn:  Kewanna, Kasprzak [[id number]]  Delivery Method: Vaginal, Spontaneous Delivery (Filed from Delivery Summary)    Pateint had an uncomplicated postpartum course.  She is ambulating, tolerating a regular diet, passing flatus, and urinating well. Patient is discharged  home in stable condition on 01/22/17.   Physical exam  Vitals:   01/21/17 0550 01/21/17 1833 01/21/17 1900 01/22/17 0543  BP: 127/83 119/71  118/78  Pulse: 75 70  68  Resp: Temp: 98.6 F (37 C) 97.7 F (36.5 C)  98.2 F (36.8 C)  TempSrc: Oral Oral  Oral  Weight:   108 kg (238 lb)   Height:    (1.727 m)    General: alert, cooperative and no distress Lochia: appropriate Uterine Fundus: firm Incision: N/A DVT Evaluation: No evidence of DVT seen on physical exam. No cords or calf tenderness. No significant calf/ankle edema. Labs: Lab Results  Component Value Date   WBC 8.5 01/20/2017   HGB 11.3 (L) 01/20/2017   HCT 33.0 (L) 01/20/2017   MCV 88.7 01/20/2017   PLT 149 (L) 01/20/2017   CMP Latest Ref Rng & Units 01/20/2017  Glucose 65 -  99 mg/dL 91  BUN 6 - 20 mg/dL <4(U)  Creatinine 9.81 - 1.00 mg/dL 1.91  Sodium 478 - 295 mmol/L 137  Potassium 3.5 - 5.1 mmol/L 3.3(L)  Chloride 101 - 111 mmol/L 102  CO2 22 - 32 mmol/L 24  Calcium 8.9 - 10.3 mg/dL 6.2(Z)  Total Protein 6.5 - 8.1 g/dL 7.0  Total Bilirubin 0.3 - 1.2 mg/dL 0.7  Alkaline Phos 38 - 126 U/L 81  AST 15 - 41 U/L 19  ALT 14 - 54 U/L 11(L)    Discharge instruction: per After Visit Summary and "Baby and Me Booklet".  After visit meds:  Allergies as of 01/22/2017   No Known Allergies     Medication List    TAKE these medications   acetaminophen 325 MG tablet Commonly known as:  TYLENOL Take 2 tablets (650 mg total) by mouth every 4 (four) hours as needed (for pain scale < 4).   ibuprofen 600 MG tablet Commonly known as:  ADVIL,MOTRIN Take 1 tablet (600 mg total) by mouth every 6 (six) hours.   NAT-RUL PRENATAL VITAMINS 28-0.8 MG Tabs Take 1 tablet by mouth daily.            Discharge Care Instructions        Start     Ordered   01/22/17 0000  acetaminophen (TYLENOL) 325 MG tablet  Every 4 hours PRN     01/22/17 1022   01/22/17 0000  ibuprofen (ADVIL,MOTRIN) 600 MG tablet   Every 6 hours     01/22/17 1022   01/20/17 0000  OB RESULTS CONSOLE GC/Chlamydia    Comments:  This external order was created through the Results Console.   01/20/17 0601   01/20/17 0000  OB RESULT CONSOLE Group B Strep    Comments:  This external order was created through the Results Console.   01/20/17 0602      Diet: routine diet  Activity: Advance as tolerated. Pelvic rest for 6 weeks.   Outpatient follow up:4 weeks Follow up Appt:Future Appointments Date Time Provider Department Center  02/17/2017 1:30 PM Brock Bad, MD [facility] None   Follow up Visit:No Follow-up on file.  Postpartum contraception: IUD Mirena  Newborn Data:   Keturah, Yerby [[id number]]  Live born unspecified sex  Birth Weight:   APGARDorothie, Wah Girl Ariellah [[id number]]  Live born female  Birth Weight: 6 lb 5.9 oz (2889 g) APGAR: 9, 9  Baby Feeding: Breast Disposition:home with mother   01/22/2017 Lennox Solders, MD  I confirm that I have verified the information documented in the resident's note and that I have also personally performed the physical exam and all medical decision making activities.   Luna Kitchens CNM

## 2017-01-22 NOTE — Progress Notes (Signed)
Pt reports she will be driving herself home.  Pt advised that MD recommendations are not to drive but she reports that she drove herself here and will drive herself home.

## 2017-01-22 NOTE — Lactation Note (Signed)
This note was copied from a baby's chart. Lactation Consultation Note Discussed w/mom supplementing after every BF 2-3 hours supplement w/BM. Mom is pumping enough colostrum to give baby as supplement. Mom states her breast are starting to get transitional milk. Discussed engorgement, prevention keeping breast from over filling.  Mom is BF baby on one breast, pumping the opposite breast, the pumping the breast baby finished eating on. Mom has DEBP at home.  Mom hoping to go home today.  Discussed importance of I&O.  Patient Name: Tanya Bailey [id number] Date: 01/22/2017 Reason for consult: Follow-up assessment;Late-preterm 34-36.6wks   Maternal Data    Feeding Feeding Type: Bottle Fed - Breast Milk Length of feed: 10 min  LATCH Score                   Interventions    Lactation Tools Discussed/Used     Consult Status Consult Status: Follow-up Date: 01/22/17 Follow-up type: In-patient    Charyl Dancer 01/22/2017, 5:41 AM

## 2017-01-26 ENCOUNTER — Encounter: Payer: Medicaid Other | Admitting: Certified Nurse Midwife

## 2017-02-17 ENCOUNTER — Ambulatory Visit: Payer: Medicaid Other | Admitting: Obstetrics

## 2017-04-09 ENCOUNTER — Ambulatory Visit (INDEPENDENT_AMBULATORY_CARE_PROVIDER_SITE_OTHER): Payer: Medicaid Other | Admitting: Obstetrics

## 2017-04-09 ENCOUNTER — Encounter: Payer: Self-pay | Admitting: Obstetrics

## 2017-04-09 DIAGNOSIS — Z3202 Encounter for pregnancy test, result negative: Secondary | ICD-10-CM | POA: Diagnosis not present

## 2017-04-09 DIAGNOSIS — Z308 Encounter for other contraceptive management: Secondary | ICD-10-CM

## 2017-04-09 LAB — POCT URINE PREGNANCY: PREG TEST UR: NEGATIVE

## 2017-04-09 NOTE — Progress Notes (Signed)
Post Partum Exam  Tanya Bailey is a 31 y.o. [phone] female who presents for a postpartum visit. Tanya Bailey is 11 weeks postpartum following a spontaneous vaginal delivery. I have fully reviewed the prenatal and intrapartum course. The delivery was at 35.5 gestational weeks.  Anesthesia: none. Postpartum course has been good. Baby's course has been good. Baby is feeding by breast. Bleeding no bleeding. Bowel function is normal. Bladder function is normal. Patient is sexually active. Contraception method is condoms. Pt requesting apt for Mirena insertion. Postpartum depression screening: SCORE 5  The following portions of the patient's history were reviewed and updated as appropriate: allergies, current medications, past family history, past medical history, past social history, past surgical history and problem list.  Review of Systems A comprehensive review of systems was negative.    Objective:  unknown if currently breastfeeding.  General:  alert and no distress   Breasts:  inspection negative, no nipple discharge or bleeding, no masses or nodularity palpable  Lungs: clear to auscultation bilaterally  Heart:  regular rate and rhythm, S1, S2 normal, no murmur, click, rub or gallop  Abdomen: soft, non-tender; bowel sounds normal; no masses,  no organomegaly   Vulva:  normal  Vagina: normal vagina  Cervix:  no lesions  Corpus: normal size, contour, position, consistency, mobility, non-tender  Adnexa:  no mass, fullness, tenderness  Rectal Exam: Not performed.        Assessment:    1. Postpartum care following vaginal delivery   2. Encounter for other contraceptive management Rx: - POCT urine pregnancy - wants IUD  Plan:   1. Contraception: IUD 2. Mirena IUD Rx 3. Follow up in: 2 weeks for IUD Insertion

## 2017-04-10 ENCOUNTER — Encounter: Payer: Self-pay | Admitting: Obstetrics

## 2017-04-23 ENCOUNTER — Encounter: Payer: Self-pay | Admitting: *Deleted

## 2017-04-23 ENCOUNTER — Ambulatory Visit (INDEPENDENT_AMBULATORY_CARE_PROVIDER_SITE_OTHER): Payer: Medicaid Other | Admitting: Obstetrics

## 2017-04-23 ENCOUNTER — Encounter: Payer: Self-pay | Admitting: Obstetrics

## 2017-04-23 ENCOUNTER — Other Ambulatory Visit (HOSPITAL_COMMUNITY)
Admission: RE | Admit: 2017-04-23 | Discharge: 2017-04-23 | Disposition: A | Discharge status: Home / Self Care | Payer: Medicaid Other | Source: Ambulatory Visit | Attending: Obstetrics | Admitting: Obstetrics

## 2017-04-23 VITALS — BP 138/96 | HR 69 | Ht 68.0 in | Wt 194.6 lb

## 2017-04-23 DIAGNOSIS — Z30430 Encounter for insertion of intrauterine contraceptive device: Secondary | ICD-10-CM | POA: Diagnosis not present

## 2017-04-23 DIAGNOSIS — N898 Other specified noninflammatory disorders of vagina: Secondary | ICD-10-CM | POA: Insufficient documentation

## 2017-04-23 LAB — POCT URINE PREGNANCY: Preg Test, Ur: NEGATIVE

## 2017-04-23 MED ORDER — LEVONORGESTREL 20 MCG/24HR IU IUD
INTRAUTERINE_SYSTEM | Freq: Once | INTRAUTERINE | Status: AC
  Administered 2017-04-23: 17:00:00 via INTRAUTERINE

## 2017-04-23 NOTE — Progress Notes (Signed)
Pt is in the office for IUD insertion. 

## 2017-04-23 NOTE — Progress Notes (Signed)
IUD Procedure Note   DIAGNOSIS: Desires long-term, reversible contraception   PROCEDURE: IUD placement Performing Provider: Bing Neighborsharles A. Clearance CootsHarper MD Patient counseled prior to procedure. I explained risks and benefits of Mirena IUD, reviewed alternative forms of contraception. Patient stated understanding and consented to continue with procedure.   LMP: 04-13-17 Pregnancy Test: Negative Lot #: pending Expiration Date: pending   IUD type: [ + ] Mirena   []  Paragard  []  Lyletta   []   Kyleena  PROCEDURE:  Timeout procedure was performed to ensure right patient and right site.  A bimanual exam was performed to determine the position of the uterus, retroverted. The speculum was placed. The vagina and cervix was sterilized in the usual manner and sterile technique was maintained throughout the course of the procedure. A single toothed tenaculum was applied to the posterior lip of the cervix and gentle traction applied. The depth of the uterus was sounded to 7. With gentle traction on the tenaculum, the IUD was inserted to the appropriate depth and inserted without difficulty.  The string was cut to an estimated 4 cm length. Bleeding was minimal. The patient tolerated the procedure well.   Follow up: The patient tolerated the procedure well without complications.  Standard post-procedure care is explained and return precautions are given.  Tanya Bailey A. Clearance CootsHarper MD

## 2017-04-28 LAB — CERVICOVAGINAL ANCILLARY ONLY
Bacterial vaginitis: POSITIVE — AB
CANDIDA VAGINITIS: NEGATIVE
CHLAMYDIA, DNA PROBE: NEGATIVE
Neisseria Gonorrhea: NEGATIVE
Trichomonas: NEGATIVE

## 2017-04-29 ENCOUNTER — Other Ambulatory Visit: Payer: Self-pay | Admitting: Obstetrics

## 2017-04-29 ENCOUNTER — Telehealth: Payer: Self-pay

## 2017-04-29 DIAGNOSIS — N760 Acute vaginitis: Principal | ICD-10-CM

## 2017-04-29 DIAGNOSIS — B9689 Other specified bacterial agents as the cause of diseases classified elsewhere: Secondary | ICD-10-CM

## 2017-04-29 MED ORDER — SECNIDAZOLE 2 G PO PACK: 1.0000 | PACK | Freq: Once | ORAL | 2 refills | Status: AC

## 2017-04-29 NOTE — Telephone Encounter (Signed)
Advised of results and rx sent 

## 2017-05-21 ENCOUNTER — Ambulatory Visit: Payer: Medicaid Other | Admitting: Obstetrics

## 2017-06-16 ENCOUNTER — Other Ambulatory Visit: Payer: Self-pay

## 2017-06-16 ENCOUNTER — Inpatient Hospital Stay (HOSPITAL_COMMUNITY)
Admission: AD | Admit: 2017-06-16 | Discharge: 2017-06-16 | Disposition: A | Discharge status: Home / Self Care | Payer: Medicaid Other | Source: Ambulatory Visit | Attending: Emergency Medicine | Admitting: Emergency Medicine

## 2017-06-16 ENCOUNTER — Inpatient Hospital Stay (HOSPITAL_COMMUNITY): Payer: Medicaid Other

## 2017-06-16 ENCOUNTER — Encounter (HOSPITAL_COMMUNITY): Payer: Self-pay | Admitting: *Deleted

## 2017-06-16 DIAGNOSIS — R51 Headache: Secondary | ICD-10-CM | POA: Insufficient documentation

## 2017-06-16 DIAGNOSIS — N760 Acute vaginitis: Secondary | ICD-10-CM | POA: Diagnosis not present

## 2017-06-16 DIAGNOSIS — R030 Elevated blood-pressure reading, without diagnosis of hypertension: Secondary | ICD-10-CM | POA: Diagnosis not present

## 2017-06-16 DIAGNOSIS — Z79899 Other long term (current) drug therapy: Secondary | ICD-10-CM | POA: Diagnosis not present

## 2017-06-16 DIAGNOSIS — R519 Headache, unspecified: Secondary | ICD-10-CM

## 2017-06-16 DIAGNOSIS — B9689 Other specified bacterial agents as the cause of diseases classified elsewhere: Secondary | ICD-10-CM | POA: Diagnosis not present

## 2017-06-16 DIAGNOSIS — I10 Essential (primary) hypertension: Secondary | ICD-10-CM

## 2017-06-16 HISTORY — DX: Anemia, unspecified: D64.9

## 2017-06-16 HISTORY — DX: Gestational (pregnancy-induced) hypertension without significant proteinuria, unspecified trimester: O13.9

## 2017-06-16 HISTORY — DX: Acute parametritis and pelvic cellulitis: N73.0

## 2017-06-16 LAB — CBC WITH DIFFERENTIAL/PLATELET
Basophils Absolute: 0 10*3/uL (ref 0.0–0.1)
Basophils Relative: 1 %
EOS PCT: 1 %
Eosinophils Absolute: 0 10*3/uL (ref 0.0–0.7)
HCT: 35.9 % — ABNORMAL LOW (ref 36.0–46.0)
Hemoglobin: 12.4 g/dL (ref 12.0–15.0)
LYMPHS ABS: 2.2 10*3/uL (ref 0.7–4.0)
LYMPHS PCT: 34 %
MCH: 29.5 pg (ref 26.0–34.0)
MCHC: 34.5 g/dL (ref 30.0–36.0)
MCV: 85.5 fL (ref 78.0–100.0)
MONO ABS: 0.2 10*3/uL (ref 0.1–1.0)
MONOS PCT: 3 %
Neutro Abs: 4.1 10*3/uL (ref 1.7–7.7)
Neutrophils Relative %: 61 %
PLATELETS: 202 10*3/uL (ref 150–400)
RBC: 4.2 MIL/uL (ref 3.87–5.11)
RDW: 13.2 % (ref 11.5–15.5)
WBC: 6.5 10*3/uL (ref 4.0–10.5)

## 2017-06-16 LAB — COMPREHENSIVE METABOLIC PANEL
ALT: 10 U/L — ABNORMAL LOW (ref 14–54)
AST: 17 U/L (ref 15–41)
Albumin: 4 g/dL (ref 3.5–5.0)
Alkaline Phosphatase: 62 U/L (ref 38–126)
Anion gap: 11 (ref 5–15)
BILIRUBIN TOTAL: 0.7 mg/dL (ref 0.3–1.2)
BUN: 8 mg/dL (ref 6–20)
CO2: 24 mmol/L (ref 22–32)
Calcium: 8.9 mg/dL (ref 8.9–10.3)
Chloride: 104 mmol/L (ref 101–111)
Creatinine, Ser: 0.72 mg/dL (ref 0.44–1.00)
Glucose, Bld: 84 mg/dL (ref 65–99)
POTASSIUM: 3.1 mmol/L — AB (ref 3.5–5.1)
Sodium: 139 mmol/L (ref 135–145)
TOTAL PROTEIN: 7.1 g/dL (ref 6.5–8.1)

## 2017-06-16 LAB — WET PREP, GENITAL
Sperm: NONE SEEN
TRICH WET PREP: NONE SEEN
YEAST WET PREP: NONE SEEN

## 2017-06-16 LAB — POCT PREGNANCY, URINE: Preg Test, Ur: NEGATIVE

## 2017-06-16 MED ORDER — BUTALBITAL-APAP-CAFFEINE 50-325-40 MG PO TABS
2.0000 | ORAL_TABLET | Freq: Once | ORAL | Status: AC
  Administered 2017-06-16: 2 via ORAL
  Filled 2017-06-16: qty 2

## 2017-06-16 MED ORDER — METRONIDAZOLE 500 MG PO TABS: 500.0000 mg | ORAL_TABLET | Freq: Two times a day (BID) | ORAL | 0 refills | Status: DC

## 2017-06-16 MED ORDER — PROCHLORPERAZINE EDISYLATE 5 MG/ML IJ SOLN
10.0000 mg | Freq: Once | INTRAMUSCULAR | Status: AC
  Administered 2017-06-16: 10 mg via INTRAVENOUS
  Filled 2017-06-16: qty 2

## 2017-06-16 MED ORDER — HYDRALAZINE HCL 10 MG PO TABS: 10.0000 mg | ORAL_TABLET | Freq: Three times a day (TID) | ORAL | 0 refills | Status: DC

## 2017-06-16 MED ORDER — DIPHENHYDRAMINE HCL 50 MG/ML IJ SOLN
25.0000 mg | Freq: Once | INTRAMUSCULAR | Status: AC
  Administered 2017-06-16: 25 mg via INTRAVENOUS
  Filled 2017-06-16: qty 1

## 2017-06-16 NOTE — Discharge Instructions (Addendum)
The MRI of your brain is essentially normal. 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 You are being started on a low dose of blood pressure medication.

## 2017-06-16 NOTE — ED Provider Notes (Signed)
[facility] EMERGENCY DEPARTMENT Provider Note   CSN: [id number] Arrival date & time: 06/16/17  1118     History   Chief Complaint Chief Complaint  Patient presents with  . Vaginal Bleeding  . Headache  . Dizziness    HPI TENESSA MARSEE is a 32 y.o. female.  HPI   32 year old female presents with concern for headache, visual changes, and elevated blood pressures.  Patient is 4 months postpartum, reports that headaches and visual changes began after delivery.  Reports that they had been intermittent at first, and she is seen her OB/GYN who checked urine protein and told her she did not have preeclampsia.  Reports over the last 4 months she is intermittently had the symptoms, however today she woke up at 6 AM and symptoms have worsened.  Reports she has had constant, severe headache that began at 6 AM and increased slowly throughout the morning.  Reports that her prior headaches would improve with rest, however today she took a nap and woke up and the headache was worse. Is throbbing, dull frontal headache and also eye pain. Reports she is had visual changes which include seeing spots in her vision diffusely in both eyes.  Reports some discomfort with eye movements, but denies any double vision.  Reports she has had nausea, but no vomiting.  Denies numbness, weakness, change in speech, change in gait.     Reports she had no prior history of headaches prior to the birth of her child.  Denies any history of migraines or family history of migraines.  Denies family history of aneurysms.  Denies history of DVTs or PE.  Reports that she had an IUD placed, and is not sure if this is contributing to her symptoms.  She went to the OB/GYN today for evaluation of the headaches as well as vaginal bleeding.  They diagnosed her with bacterial vaginosis, but given headaches and elevated blood pressures to the 160s systolic, sent her to the emergency department for further  care.   Past Medical History:  Diagnosis Date  . Abnormal Pap smear   . Anemia   . Chlamydia   . Ectopic pregnancy   . Heart murmur    childhood  . PID (acute pelvic inflammatory disease)    with Paraguard  . Pregnancy induced hypertension    end of last preg  . Preterm labor   . Vaginal Pap smear, abnormal     Patient Active Problem List   Diagnosis Date Noted  . SVD (spontaneous vaginal delivery) 01/20/2017  . Positive GBS test 01/19/2017  . Indication for care in labor and delivery, antepartum 01/14/2017  . History of preterm delivery, currently pregnant 10/29/2016  . Supervision of high risk pregnancy, antepartum, third trimester 08/31/2016    Past Surgical History:  Procedure Laterality Date  . NO PAST SURGERIES    . TOOTH EXTRACTION      OB History    Gravida Para Term Preterm AB Living   7 6 2 4 1 5    SAB TAB Ectopic Multiple Live Births   0 0 1 0 6       Home Medications    Prior to Admission medications   Medication Sig Start Date End Date Taking? Authorizing Provider  FENUGREEK PO Take 1 capsule by mouth 3 (three) times daily.   Yes [provider]  Prenatal Vit-Fe Fumarate-FA (NAT-RUL PRENATAL VITAMINS) 28-0.8 MG TABS Take 1 tablet by mouth daily.    Yes [provider]  acetaminophen (TYLENOL) 325 MG tablet Take 2 tablets (650 mg total) by mouth every 4 (four) hours as needed (for pain scale < 4). Patient not taking: Reported on 06/16/2017 01/22/17   Lennox Solders, MD  ibuprofen (ADVIL,MOTRIN) 600 MG tablet Take 1 tablet (600 mg total) by mouth every 6 (six) hours. Patient not taking: Reported on 06/16/2017 01/22/17   Lennox Solders, MD  metroNIDAZOLE (FLAGYL) 500 MG tablet Take 1 tablet (500 mg total) by mouth 2 (two) times daily. 06/16/17   Marny Lowenstein, PA-C    Family History Family History  Problem Relation Age of Onset  . Cancer Maternal Grandfather   . Prostate cancer Paternal Grandfather   . Cancer Paternal  Grandfather   . Anesthesia problems Neg Hx   . Alcohol abuse Neg Hx   . Arthritis Neg Hx   . Asthma Neg Hx   . Birth defects Neg Hx   . COPD Neg Hx   . Depression Neg Hx   . Diabetes Neg Hx   . Drug abuse Neg Hx   . Early death Neg Hx   . Hearing loss Neg Hx   . Heart disease Neg Hx   . Hyperlipidemia Neg Hx   . Hypertension Neg Hx   . Kidney disease Neg Hx   . Learning disabilities Neg Hx   . Mental illness Neg Hx   . Mental retardation Neg Hx   . Miscarriages / Stillbirths Neg Hx   . Stroke Neg Hx   . Vision loss Neg Hx   . Other Neg Hx     Social History Social History   Tobacco Use  . Smoking status: Never Smoker  . Smokeless tobacco: Never Used  Substance Use Topics  . Alcohol use: No    Comment: Special occas. when not pregnant  . Drug use: No     Allergies   Patient has no known allergies.   Review of Systems Review of Systems  Constitutional: Negative for fever.  HENT: Negative for sore throat.   Eyes: Positive for pain and visual disturbance.  Respiratory: Negative for cough and shortness of breath.   Cardiovascular: Negative for chest pain.  Gastrointestinal: Positive for nausea. Negative for abdominal pain, diarrhea and vomiting.  Genitourinary: Negative for difficulty urinating.  Musculoskeletal: Negative for back pain and neck pain.  Skin: Negative for rash.  Neurological: Positive for headaches. Negative for dizziness, syncope, facial asymmetry, weakness and numbness.     Physical Exam Updated Vital Signs BP (!) 158/108   Pulse (!) 48   Temp 97.8 F (36.6 C) (Oral)   Resp (!) 9   Wt 85.3 kg (188 lb)   SpO2 100%   Breastfeeding? Yes   BMI 28.59 kg/m   Physical Exam  Constitutional: She is oriented to person, place, and time. She appears well-developed and well-nourished. No distress.  HENT:  Head: Normocephalic and atraumatic.  Eyes: Conjunctivae and EOM are normal.  Neck: Normal range of motion.  Cardiovascular: Normal rate,  regular rhythm, normal heart sounds and intact distal pulses. Exam reveals no gallop and no friction rub.  No murmur heard. Pulmonary/Chest: Effort normal and breath sounds normal. No respiratory distress. She has no wheezes. She has no rales.  Abdominal: Soft. She exhibits no distension. There is no tenderness. There is no guarding.  Musculoskeletal: She exhibits no edema or tenderness.  Neurological: She is alert and oriented to person, place, and time. She has normal strength. No cranial nerve deficit  or sensory deficit. Coordination normal. GCS eye subscore is 4. GCS verbal subscore is 5. GCS motor subscore is 6.  Skin: Skin is warm and dry. No rash noted. She is not diaphoretic. No erythema.  Nursing note and vitals reviewed.    ED Treatments / Results  Labs (all labs ordered are listed, but only abnormal results are displayed) Labs Reviewed  WET PREP, GENITAL - Abnormal; Notable for the following components:      Result Value   Clue Cells Wet Prep HPF POC PRESENT (*)    WBC, Wet Prep HPF POC FEW (*)    All other components within normal limits  CBC WITH DIFFERENTIAL/PLATELET - Abnormal; Notable for the following components:   HCT 35.9 (*)    All other components within normal limits  HIV ANTIBODY (ROUTINE TESTING)  COMPREHENSIVE METABOLIC PANEL  POCT PREGNANCY, URINE  GC/CHLAMYDIA PROBE AMP ([place]) NOT AT [facility]    EKG  EKG Interpretation None       Radiology No results found.  Procedures Procedures (including critical care time)  Medications Ordered in ED Medications  butalbital-acetaminophen-caffeine (FIORICET, ESGIC) 50-325-40 MG per tablet 2 tablet (2 tablets Oral Given 06/16/17 1232)  prochlorperazine (COMPAZINE) injection 10 mg (10 mg Intravenous Given 06/16/17 1534)  diphenhydrAMINE (BENADRYL) injection 25 mg (25 mg Intravenous Given 06/16/17 1534)     Initial Impression / Assessment and Plan / ED Course  I have reviewed the triage vital signs and the  nursing notes.  Pertinent labs & imaging results that were available during my care of the patient were reviewed by me and considered in my medical decision making (see chart for details).    32 year old female presents with concern for headache, visual changes, and elevated blood pressures.  Patient is 4 months postpartum, reports that headaches and visual changes began after delivery. Has IUD in place.  Pt is NV intact, with normal visual fields, normal EOM.  No fevers, doubt meningitis.  Headache is slow onset, and also regular hemorrhage.  Given headache with visual changes, history of recent postpartum state, will obtain CT head and MRI and MRV to evaluate for dural venous thrombosis, process, or other abnormalities.  If MRI and imaging is negative, patient may be discharged with further care.  She is hypertensive mildly in the emergency department, discussed this may be secondary to pain or anxiety, however will evaluate after receiving pain medications.  Discussed it is reasonable to have her follow-up with her primary care doctor regarding her blood pressures but is also reasonable to start low dose of medication here if desired.  Signed out to Dr. Juleen ChinaKohut with CT, MR and response to medication pending.     Final Clinical Impressions(s) / ED Diagnoses   Final diagnoses:  Elevated blood pressure reading without diagnosis of hypertension  Acute intractable headache, unspecified headache type    ED Discharge Orders        Ordered    metroNIDAZOLE (FLAGYL) 500 MG tablet  2 times daily     06/16/17 1311       Alvira MondaySchlossman, Talli Kimmer, MD 06/16/17 1645

## 2017-06-16 NOTE — ED Notes (Signed)
Patient transported to CT 

## 2017-06-16 NOTE — ED Notes (Signed)
Patient transported to MRI 

## 2017-06-16 NOTE — MAU Note (Signed)
This morning, woke up with a headache, went back to bed- when woke up the headache was still there, almost fell out, was very dizzy. Took Ibuprofen, some relief. IUD placed 12/28 , has been bleeding since, now has an odor.

## 2017-06-16 NOTE — MAU Note (Signed)
Urine in lab 

## 2017-06-16 NOTE — MAU Provider Note (Addendum)
History     CSN: [id number]  Arrival date and time: 06/16/17 1118   First Provider Initiated Contact with Patient 06/16/17 1157      Chief Complaint  Patient presents with  . Vaginal Bleeding  . Headache  . Dizziness   HPI  [name] is a 32 y.o. [phone] who presents to MAU today with complaint of vaginal odor and headache. The patient had IUD placed at [facility] 04/23/18. She had BV diagnosed at that time that was treated. She has noted some bleeding daily since insertion and today noted vaginal odor. She has had off and on cramping, but none today. Headache started this morning with significant dizziness and blurred vision. She tried Ibuprofen with minimal relief. She denies a history of HTN or chest pain today.   OB History    Gravida Para Term Preterm AB Living   7 6 2 4 1 5    SAB TAB Ectopic Multiple Live Births   0 0 1 0 6      Past Medical History:  Diagnosis Date  . Abnormal Pap smear   . Anemia   . Chlamydia   . Ectopic pregnancy   . Heart murmur    childhood  . PID (acute pelvic inflammatory disease)    with Paraguard  . Pregnancy induced hypertension    end of last preg  . Preterm labor   . Vaginal Pap smear, abnormal     Past Surgical History:  Procedure Laterality Date  . NO PAST SURGERIES    . TOOTH EXTRACTION      Family History  Problem Relation Age of Onset  . Cancer Maternal Grandfather   . Prostate cancer Paternal Grandfather   . Cancer Paternal Grandfather   . Anesthesia problems Neg Hx   . Alcohol abuse Neg Hx   . Arthritis Neg Hx   . Asthma Neg Hx   . Birth defects Neg Hx   . COPD Neg Hx   . Depression Neg Hx   . Diabetes Neg Hx   . Drug abuse Neg Hx   . Early death Neg Hx   . Hearing loss Neg Hx   . Heart disease Neg Hx   . Hyperlipidemia Neg Hx   . Hypertension Neg Hx   . Kidney disease Neg Hx   . Learning disabilities Neg Hx   . Mental illness Neg Hx   . Mental retardation Neg Hx   . Miscarriages /  Stillbirths Neg Hx   . Stroke Neg Hx   . Vision loss Neg Hx   . Other Neg Hx     Social History   Tobacco Use  . Smoking status: Never Smoker  . Smokeless tobacco: Never Used  Substance Use Topics  . Alcohol use: No    Comment: Special occas. when not pregnant  . Drug use: No    Allergies: No Known Allergies  Medications Prior to Admission  Medication Sig Dispense Refill Last Dose  . acetaminophen (TYLENOL) 325 MG tablet Take 2 tablets (650 mg total) by mouth every 4 (four) hours as needed (for pain scale < 4). (Patient not taking: Reported on 04/23/2017) 30 tablet 0 Not Taking  . ibuprofen (ADVIL,MOTRIN) 600 MG tablet Take 1 tablet (600 mg total) by mouth every 6 (six) hours. (Patient not taking: Reported on 04/23/2017) 30 tablet 0 Not Taking  . Prenatal Vit-Fe Fumarate-FA (NAT-RUL PRENATAL VITAMINS) 28-0.8 MG TABS Take 1 tablet by mouth daily.    Not Taking  Review of Systems  Constitutional: Negative for fever.  Gastrointestinal: Negative for abdominal pain, constipation, diarrhea, nausea and vomiting.  Genitourinary: Positive for vaginal bleeding. Negative for vaginal discharge.  Neurological: Positive for headaches.   Physical Exam   Blood pressure (!) 167/105, pulse (!) 56, temperature 98.8 F (37.1 C), temperature source Oral, resp. rate 16, weight 188 lb (85.3 kg), currently breastfeeding.  Physical Exam  Nursing note and vitals reviewed. Constitutional: She is oriented to person, place, and time. She appears well-developed and well-nourished. No distress.  HENT:  Head: Normocephalic and atraumatic.  Cardiovascular: Bradycardia present.  Respiratory: Effort normal.  GI: Soft. She exhibits no distension and no mass. There is no tenderness. There is no rebound and no guarding.  Genitourinary: Uterus is not enlarged and not tender. Cervix exhibits no motion tenderness, no discharge and no friability. Right adnexum displays no mass and no tenderness. Left adnexum  displays no mass and no tenderness. There is bleeding (scant) in the vagina. No vaginal discharge found.  Genitourinary Comments: IUD strings are noted and appear appropriate length  Neurological: She is alert and oriented to person, place, and time.  Skin: Skin is warm and dry. No erythema.  Psychiatric: She has a normal mood and affect.     Results for orders placed or performed during the hospital encounter of 06/16/17 (from the past 24 hour(s))  Pregnancy, urine POC     Status: None   Collection Time: 06/16/17 12:07 PM  Result Value Ref Range   Preg Test, Ur NEGATIVE NEGATIVE  Wet prep, genital     Status: Abnormal   Collection Time: 06/16/17 12:15 PM  Result Value Ref Range   Yeast Wet Prep HPF POC NONE SEEN NONE SEEN   Trich, Wet Prep NONE SEEN NONE SEEN   Clue Cells Wet Prep HPF POC PRESENT (A) NONE SEEN   WBC, Wet Prep HPF POC FEW (A) NONE SEEN   Sperm NONE SEEN   CBC with Differential/Platelet     Status: Abnormal   Collection Time: 06/16/17 12:37 PM  Result Value Ref Range   WBC 6.5 4.0 - 10.5 K/uL   RBC 4.20 3.87 - 5.11 MIL/uL   Hemoglobin 12.4 12.0 - 15.0 g/dL   HCT 16.135.9 (L) 09.636.0 - 04.546.0 %   MCV 85.5 78.0 - 100.0 fL   MCH 29.5 26.0 - 34.0 pg   MCHC 34.5 30.0 - 36.0 g/dL   RDW 40.913.2 81.111.5 - 91.415.5 %   Platelets 202 150 - 400 K/uL   Neutrophils Relative % 61 %   Neutro Abs 4.1 1.7 - 7.7 K/uL   Lymphocytes Relative 34 %   Lymphs Abs 2.2 0.7 - 4.0 K/uL   Monocytes Relative 3 %   Monocytes Absolute 0.2 0.1 - 1.0 K/uL   Eosinophils Relative 1 %   Eosinophils Absolute 0.0 0.0 - 0.7 K/uL   Basophils Relative 1 %   Basophils Absolute 0.0 0.0 - 0.1 K/uL    Patient Vitals for the past 24 hrs:  BP Temp Temp src Pulse Resp Weight  06/16/17 1300 (!) 167/105 - - (!) 56 - -  06/16/17 1245 (!) 162/105 - - (!) 58 - -  06/16/17 1230 (!) 146/91 - - 60 - -  06/16/17 1217 (!) 145/90 - - 60 - -  06/16/17 1141 (!) 146/90 98.8 F (37.1 C) Oral 69 16 188 lb (85.3 kg)    MAU Course   Procedures None  MDM UPT - negative UA, CBC, Wet prep,  GC/Chlamydia, HIV today  Fioricet given for headache - some improvement + blurred vision Severe range blood pressures noted even with some improvement in headache Discussed patient with Dr. Silverio Lay at [facility] who is accepting the patient transfer  Assessment and Plan  A: Bacterial Vaginosis  Elevated blood pressure   P: Transfer to [facility] for further evaluation of blood pressure Rx for Flagyl given to patient  If bleeding persists, follow-up with [facility] Patient may return to MAU as needed or if her condition were to change or worsen  Vonzella Nipple, PA-C 06/16/2017, 1:09 PM

## 2017-06-17 LAB — GC/CHLAMYDIA PROBE AMP (~~LOC~~) NOT AT ARMC
Chlamydia: NEGATIVE
Neisseria Gonorrhea: NEGATIVE

## 2017-06-17 LAB — HIV ANTIBODY (ROUTINE TESTING W REFLEX): HIV Screen 4th Generation wRfx: NONREACTIVE

## 2017-09-14 ENCOUNTER — Emergency Department (HOSPITAL_COMMUNITY)
Admission: EM | Admit: 2017-09-14 | Discharge: 2017-09-15 | Disposition: A | Discharge status: Home / Self Care | Payer: Medicaid Other | Attending: Emergency Medicine | Admitting: Emergency Medicine

## 2017-09-14 ENCOUNTER — Encounter (HOSPITAL_COMMUNITY): Payer: Self-pay | Admitting: Emergency Medicine

## 2017-09-14 ENCOUNTER — Other Ambulatory Visit: Payer: Self-pay

## 2017-09-14 ENCOUNTER — Emergency Department (HOSPITAL_COMMUNITY): Payer: Medicaid Other

## 2017-09-14 DIAGNOSIS — Z5321 Procedure and treatment not carried out due to patient leaving prior to being seen by health care provider: Secondary | ICD-10-CM | POA: Diagnosis not present

## 2017-09-14 DIAGNOSIS — R42 Dizziness and giddiness: Secondary | ICD-10-CM | POA: Diagnosis present

## 2017-09-14 DIAGNOSIS — R202 Paresthesia of skin: Secondary | ICD-10-CM | POA: Insufficient documentation

## 2017-09-14 DIAGNOSIS — R079 Chest pain, unspecified: Secondary | ICD-10-CM | POA: Diagnosis not present

## 2017-09-14 LAB — BASIC METABOLIC PANEL
ANION GAP: 8 (ref 5–15)
BUN: 9 mg/dL (ref 6–20)
CALCIUM: 8.7 mg/dL — AB (ref 8.9–10.3)
CO2: 23 mmol/L (ref 22–32)
Chloride: 106 mmol/L (ref 101–111)
Creatinine, Ser: 0.8 mg/dL (ref 0.44–1.00)
Glucose, Bld: 92 mg/dL (ref 65–99)
POTASSIUM: 3.6 mmol/L (ref 3.5–5.1)
SODIUM: 137 mmol/L (ref 135–145)

## 2017-09-14 LAB — CBC
HCT: 37.8 % (ref 36.0–46.0)
HEMOGLOBIN: 12.9 g/dL (ref 12.0–15.0)
MCH: 30.1 pg (ref 26.0–34.0)
MCHC: 34.1 g/dL (ref 30.0–36.0)
MCV: 88.3 fL (ref 78.0–100.0)
Platelets: 193 10*3/uL (ref 150–400)
RBC: 4.28 MIL/uL (ref 3.87–5.11)
RDW: 12.3 % (ref 11.5–15.5)
WBC: 5.1 10*3/uL (ref 4.0–10.5)

## 2017-09-14 LAB — I-STAT BETA HCG BLOOD, ED (MC, WL, AP ONLY): I-stat hCG, quantitative: <5 m[IU]/mL (ref <5)

## 2017-09-14 LAB — I-STAT TROPONIN, ED: TROPONIN I, POC: 0.01 ng/mL (ref 0.00–0.08)

## 2017-09-14 NOTE — ED Notes (Signed)
EKG giving to MD [place] 

## 2017-09-15 NOTE — ED Notes (Signed)
Follow up call made  No answer    1300   09/15/17  s Venba Zenner rn

## 2017-09-15 NOTE — ED Triage Notes (Signed)
Patient complaining of dizziness and mid chest pain. Patient states she left work due to the chest pain. Patient states that she was having numbness and tingling in her left shoulder down to her hands with some numbness/pins and needles.

## 2018-02-15 ENCOUNTER — Emergency Department (HOSPITAL_COMMUNITY)
Admission: EM | Admit: 2018-02-15 | Discharge: 2018-02-15 | Discharge status: Absent without leave | Payer: Medicaid Other

## 2018-11-17 ENCOUNTER — Telehealth: Payer: Self-pay | Admitting: Physician Assistant

## 2018-11-17 ENCOUNTER — Ambulatory Visit (INDEPENDENT_AMBULATORY_CARE_PROVIDER_SITE_OTHER)
Admission: RE | Admit: 2018-11-17 | Discharge: 2018-11-17 | Disposition: A | Discharge status: Home / Self Care | Payer: Self-pay | Source: Ambulatory Visit

## 2018-11-17 DIAGNOSIS — R5383 Other fatigue: Secondary | ICD-10-CM

## 2018-11-17 DIAGNOSIS — R1110 Vomiting, unspecified: Secondary | ICD-10-CM

## 2018-11-17 DIAGNOSIS — Z20822 Contact with and (suspected) exposure to covid-19: Secondary | ICD-10-CM

## 2018-11-17 DIAGNOSIS — R110 Nausea: Secondary | ICD-10-CM

## 2018-11-17 DIAGNOSIS — Z20828 Contact with and (suspected) exposure to other viral communicable diseases: Secondary | ICD-10-CM

## 2018-11-17 DIAGNOSIS — G43909 Migraine, unspecified, not intractable, without status migrainosus: Secondary | ICD-10-CM

## 2018-11-17 DIAGNOSIS — M791 Myalgia, unspecified site: Secondary | ICD-10-CM

## 2018-11-17 MED ORDER — ONDANSETRON HCL 4 MG PO TABS: 4.0000 mg | ORAL_TABLET | Freq: Four times a day (QID) | ORAL | 0 refills | Status: DC

## 2018-11-17 MED ORDER — IBUPROFEN 800 MG PO TABS: 800.0000 mg | ORAL_TABLET | Freq: Three times a day (TID) | ORAL | 0 refills | Status: DC

## 2018-11-17 NOTE — ED Provider Notes (Addendum)
[place]    Virtual Visit via Video Note:  Tanya Bailey  initiated request for Telemedicine visit with [facility] Urgent Care team. I connected with Tanya Bailey  on 11/17/2018 at 4:00 PM  for a synchronized telemedicine visit using a telephone enabled HIPPA compliant telemedicine application. I verified that I am speaking with Tanya Bailey  using two identifiers. Lestine Box, PA-C  was physically located in a [facility] Urgent care site and [name] was located at a different location.   The limitations of evaluation and management by telemedicine as well as the availability of in-person appointments were discussed. Patient was informed that she  may incur a bill ( including co-pay) for this virtual visit encounter. Tanya Bailey  expressed understanding and gave verbal consent to proceed with virtual visit.  [id number] 11/17/18 Arrival Time: 1422  Cc: Fatigue, HA, and myalgias  SUBJECTIVE:  Tanya Bailey is a 33 y.o. female who presents with HA, myalgias, and fatigue x 1 week.  Denies sick exposure to COVID, flu or strep.  Denies recent travel.  Has tried increasing fluids without relief.  Denies aggravating factors.   Denies previous symptoms in the past.   Denies fever, chills, sinus pain, rhinorrhea, congestion, sore throat, SOB, cough, wheezing, chest pain, chest pressure, nausea, vomiting, changes in bowel or bladder habits.    Does admit to 1 episode of spitting up some blood this morning after dry heaving while brushing teeth.    ROS: As per HPI.  All other pertinent ROS negative.     Past Medical History:  Diagnosis Date  . Abnormal Pap smear   . Anemia   . Chlamydia   . Ectopic pregnancy   . Heart murmur    childhood  . PID (acute pelvic inflammatory disease)    with Paraguard  . Pregnancy induced hypertension    end of last preg  . Preterm labor   . Vaginal Pap smear, abnormal    Past Surgical History:  Procedure  Laterality Date  . NO PAST SURGERIES    . TOOTH EXTRACTION     No Known Allergies No current facility-administered medications on file prior to encounter.    Current Outpatient Medications on File Prior to Encounter  Medication Sig Dispense Refill  . FENUGREEK PO Take 1 capsule by mouth 3 (three) times daily.    . hydrALAZINE (APRESOLINE) 10 MG tablet Take 1 tablet (10 mg total) by mouth 3 (three) times daily. 90 tablet 0  . Prenatal Vit-Fe Fumarate-FA (NAT-RUL PRENATAL VITAMINS) 28-0.8 MG TABS Take 1 tablet by mouth daily.         OBJECTIVE:  There were no vitals filed for this visit.  Video not available; HPI and PE done on telephone  General appearance: alert; no distress Throat: no hot potato voice Lungs: normal respiratory effort; speaking in full sentences without difficulty Psychological: alert and cooperative; normal mood and affect  ASSESSMENT & PLAN:  1. Suspected Covid-19 Virus Infection   2. Other fatigue   3. Myalgia   4. Nausea without vomiting     Meds ordered this encounter  Medications  . ibuprofen (ADVIL) 800 MG tablet    Sig: Take 1 tablet (800 mg total) by mouth 3 (three) times daily.    Dispense:  21 tablet    Refill:  0    Order Specific Question:   Supervising Provider    Answer:   Raylene Everts [[id number]]  . ondansetron (  ZOFRAN) 4 MG tablet    Sig: Take 1 tablet (4 mg total) by mouth every 6 (six) hours.    Dispense:  12 tablet    Refill:  0    Order Specific Question:   Supervising Provider    Answer:   Eustace MooreELSON, YVONNE SUE [[id number]]    Orders Placed This Encounter  Procedures  . Novel Coronavirus, NAA ([facility])    Order Specific Question:   Known Exposure    Answer:   Unknown    Discussed spitting up blood most likely secondary to dry heaving.  Possibly superficial blood vessel in esophagus.  Will follow up in person if new or worsening symptoms.    COVID testing ordered.  Please go to [address] [place], [place], M-F 8am- 4  pm.  We will call you with your test results if they are positive.   In the meantime: You should remain isolated in your home for 7 days from symptom onset AND greater than 72 hours after symptoms resolution (absence of fever without the use of fever-reducing medication and improvement in respiratory symptoms), whichever is longer Get plenty of rest and push fluids Tessalon Perles prescribed for cough Ibuprofen prescribed.  Take as needed for body aches, fatigue, and headache.  DO NOT TAKE with other antiinflammatories as this may cause GI upset and/or bleed Zofran prescribed.  Take as needed for nausea.   DO NOT TAKE ABOVE MEDICATIONS if pregnant or breast feeding Use OTC medications like ibuprofen or tylenol as needed fever or pain Call or go to the ED if you have any new or worsening symptoms such as fever, worsening cough, shortness of breath, chest tightness, chest pain, turning blue, changes in mental status, etc...  I discussed the assessment and treatment plan with the patient. The patient was provided an opportunity to ask questions and all were answered. The patient agreed with the plan and demonstrated an understanding of the instructions.   The patient was advised to call back or seek an in-person evaluation if the symptoms worsen or if the condition fails to improve as anticipated.  I provided 12 minutes of non-face-to-face time during this encounter.      Rennis HardingWurst, Skie Vitrano, PA-C 11/17/18 1605

## 2018-11-17 NOTE — Progress Notes (Signed)
  Based on what you shared with me, I feel your condition warrants further evaluation and I recommend that you be seen for a face to face office visit.  Giving that your nausea and dry heaves are associated with a migraine headache, I recommend that you be seen for assessment so that abortive therapy can be given for the migraine (cannot treat via e-visit) to resolve this. Antinausea medication can be given at the same time.   NOTE: If you entered your credit card information for this eVisit, you will not be charged. You may see a "hold" on your card for the $35 but that hold will drop off and you will not have a charge processed.  If you are having a true medical emergency please call 911.     For an urgent face to face visit, [place] has five urgent care centers for your convenience:    [email or web address] to reserve your spot online an avoid wait times  [facility] (New Address!) [address] [place], [place] [address] *Just off [address], across the road from [place] hours of operation: Monday-Friday, 12 PM to 6 PM  Closed Saturday & Sunday   The following sites will take your insurance:  . [facility]    [phone]                  Get Driving Directions  [address], [place] [address] . 10 am to 8 pm Monday-Friday . 12 pm to 8 pm Saturday-Sunday   . [facility] at [place]                  Get Driving Directions  [address], [place], [place] [address] . 8 am to 8 pm Monday-Friday . 9 am to 6 pm Saturday . 11 am to 6 pm Sunday   . [facility] at [place]                  Get Driving Directions   [address] [place], [place] [address] . 8 am to 8 pm Monday-Friday . 8 am to 4 pm Saturday-Sunday    . [facility] at [place]                    Get  Driving Directions  [phone]  [address], [place], [place] [address]  . Monday-Friday, 12 PM to 6 PM    Your e-visit answers were reviewed by a board certified advanced clinical practitioner to complete your personal care plan.  Thank you for using e-Visits.

## 2018-11-17 NOTE — Discharge Instructions (Addendum)
COVID testing ordered.  Please go to [facility] [place], [place], M-F 8am- 4 pm.  We will call you with your test results if they are positive.   In the meantime: You should remain isolated in your home for 7 days from symptom onset AND greater than 72 hours after symptoms resolution (absence of fever without the use of fever-reducing medication and improvement in respiratory symptoms), whichever is longer Get plenty of rest and push fluids Tessalon Perles prescribed for cough Ibuprofen prescribed.  Take as needed for body aches, fatigue, and headache.  DO NOT TAKE with other antiinflammatories as this may cause GI upset and/or bleed Zofran prescribed.  Take as needed for nausea.   DO NOT TAKE ABOVE MEDICATIONS if pregnant or breast feeding Use OTC medications like ibuprofen or tylenol as needed fever or pain Call or go to the ED if you have any new or worsening symptoms such as fever, worsening cough, shortness of breath, chest tightness, chest pain, turning blue, changes in mental status, etc..Marland Kitchen

## 2019-06-07 ENCOUNTER — Other Ambulatory Visit: Payer: Self-pay

## 2019-06-07 ENCOUNTER — Emergency Department (HOSPITAL_COMMUNITY): Payer: Self-pay

## 2019-06-07 ENCOUNTER — Emergency Department (HOSPITAL_COMMUNITY)
Admission: EM | Admit: 2019-06-07 | Discharge: 2019-06-07 | Disposition: A | Discharge status: Home / Self Care | Payer: Self-pay | Attending: Emergency Medicine | Admitting: Emergency Medicine

## 2019-06-07 ENCOUNTER — Encounter (HOSPITAL_COMMUNITY): Payer: Self-pay | Admitting: Family Medicine

## 2019-06-07 DIAGNOSIS — B9689 Other specified bacterial agents as the cause of diseases classified elsewhere: Secondary | ICD-10-CM | POA: Insufficient documentation

## 2019-06-07 DIAGNOSIS — B373 Candidiasis of vulva and vagina: Secondary | ICD-10-CM | POA: Insufficient documentation

## 2019-06-07 DIAGNOSIS — R102 Pelvic and perineal pain: Secondary | ICD-10-CM

## 2019-06-07 DIAGNOSIS — B3731 Acute candidiasis of vulva and vagina: Secondary | ICD-10-CM

## 2019-06-07 DIAGNOSIS — Z79899 Other long term (current) drug therapy: Secondary | ICD-10-CM | POA: Insufficient documentation

## 2019-06-07 DIAGNOSIS — N760 Acute vaginitis: Secondary | ICD-10-CM | POA: Insufficient documentation

## 2019-06-07 LAB — COMPREHENSIVE METABOLIC PANEL
ALT: 11 U/L (ref 0–44)
AST: 16 U/L (ref 15–41)
Albumin: 4.6 g/dL (ref 3.5–5.0)
Alkaline Phosphatase: 39 U/L (ref 38–126)
Anion gap: 9 (ref 5–15)
BUN: 10 mg/dL (ref 6–20)
CO2: 26 mmol/L (ref 22–32)
Calcium: 9.1 mg/dL (ref 8.9–10.3)
Chloride: 104 mmol/L (ref 98–111)
Creatinine, Ser: 0.85 mg/dL (ref 0.44–1.00)
GFR calc Af Amer: >60 mL/min (ref >60)
GFR calc non Af Amer: >60 mL/min (ref >60)
Glucose, Bld: 83 mg/dL (ref 70–99)
Potassium: 3.8 mmol/L (ref 3.5–5.1)
Sodium: 139 mmol/L (ref 135–145)
Total Bilirubin: 0.5 mg/dL (ref 0.3–1.2)
Total Protein: 7.7 g/dL (ref 6.5–8.1)

## 2019-06-07 LAB — PREGNANCY, URINE: Preg Test, Ur: NEGATIVE

## 2019-06-07 LAB — URINALYSIS, ROUTINE W REFLEX MICROSCOPIC
Bilirubin Urine: NEGATIVE
Glucose, UA: NEGATIVE mg/dL
Hgb urine dipstick: NEGATIVE
Ketones, ur: NEGATIVE mg/dL
Leukocytes,Ua: NEGATIVE
Nitrite: NEGATIVE
Protein, ur: NEGATIVE mg/dL
Specific Gravity, Urine: 1.025 (ref 1.005–1.030)
pH: 6 (ref 5.0–8.0)

## 2019-06-07 LAB — CBC WITH DIFFERENTIAL/PLATELET
Abs Immature Granulocytes: 0.01 10*3/uL (ref 0.00–0.07)
Basophils Absolute: 0 10*3/uL (ref 0.0–0.1)
Basophils Relative: 1 %
Eosinophils Absolute: 0 10*3/uL (ref 0.0–0.5)
Eosinophils Relative: 1 %
HCT: 39.4 % (ref 36.0–46.0)
Hemoglobin: 13.1 g/dL (ref 12.0–15.0)
Immature Granulocytes: 0 %
Lymphocytes Relative: 27 %
Lymphs Abs: 1.2 10*3/uL (ref 0.7–4.0)
MCH: 31.3 pg (ref 26.0–34.0)
MCHC: 33.2 g/dL (ref 30.0–36.0)
MCV: 94 fL (ref 80.0–100.0)
Monocytes Absolute: 0.5 10*3/uL (ref 0.1–1.0)
Monocytes Relative: 10 %
Neutro Abs: 2.7 10*3/uL (ref 1.7–7.7)
Neutrophils Relative %: 61 %
Platelets: 169 10*3/uL (ref 150–400)
RBC: 4.19 MIL/uL (ref 3.87–5.11)
RDW: 12.4 % (ref 11.5–15.5)
WBC: 4.4 10*3/uL (ref 4.0–10.5)
nRBC: 0 % (ref 0.0–0.2)

## 2019-06-07 LAB — WET PREP, GENITAL
Sperm: NONE SEEN
Trich, Wet Prep: NONE SEEN

## 2019-06-07 LAB — LIPASE, BLOOD: Lipase: 33 U/L (ref 11–51)

## 2019-06-07 LAB — HIV ANTIBODY (ROUTINE TESTING W REFLEX): HIV Screen 4th Generation wRfx: NONREACTIVE

## 2019-06-07 MED ORDER — STERILE WATER FOR INJECTION IJ SOLN
INTRAMUSCULAR | Status: AC
  Administered 2019-06-07: 17:00:00 2.1 mL
  Filled 2019-06-07: qty 10

## 2019-06-07 MED ORDER — METRONIDAZOLE 500 MG PO TABS: 500.0000 mg | ORAL_TABLET | Freq: Two times a day (BID) | ORAL | 0 refills | Status: DC

## 2019-06-07 MED ORDER — FLUCONAZOLE 150 MG PO TABS
150.0000 mg | ORAL_TABLET | Freq: Once | ORAL | Status: AC
  Administered 2019-06-07: 17:00:00 150 mg via ORAL
  Filled 2019-06-07: qty 1

## 2019-06-07 MED ORDER — AZITHROMYCIN 250 MG PO TABS
1000.0000 mg | ORAL_TABLET | Freq: Once | ORAL | Status: AC
  Administered 2019-06-07: 1000 mg via ORAL
  Filled 2019-06-07: qty 4

## 2019-06-07 MED ORDER — ONDANSETRON 8 MG PO TBDP
8.0000 mg | ORAL_TABLET | Freq: Once | ORAL | Status: AC
  Administered 2019-06-07: 8 mg via ORAL
  Filled 2019-06-07: qty 1

## 2019-06-07 MED ORDER — CEFTRIAXONE SODIUM 1 G IJ SOLR
500.0000 mg | Freq: Once | INTRAMUSCULAR | Status: AC
  Administered 2019-06-07: 17:00:00 500 mg via INTRAMUSCULAR
  Filled 2019-06-07: qty 10

## 2019-06-07 NOTE — Discharge Instructions (Addendum)
You have been seen today for pelvic pain. Please read and follow all provided instructions. Return to the emergency room for worsening condition or new concerning symptoms.    You were found to have a yeast infection and bacterial vaginosis.  Your chlamydia and gonorrhea tests are pending.  We treated you prophylactically for gonorrhea and chlamydia.  If your results end up being positive you will need to let your partner know so he can be treated as well.  1. Medications:  Prescription sent to your pharmacy for Flagyl.  This is a medication used to treat bacterial vaginosis.  Do not drink alcohol while taking this medicine as it will make to be violently ill and vomit.  Continue usual home medications Take medications as prescribed. Please review all of the medicines and only take them if you do not have an allergy to them.   2. Treatment: rest, drink plenty of fluids -You were already treated for a yeast infection with the fluconazole pill we gave you today.  You do not need further treatment for the yeast infection.  3. Follow Up:  Please follow up with primary care provider by scheduling an appointment as soon as possible for a visit  If you do not have a primary care physician, contact [facility] at [phone] for referral -Recommend follow-up with your primary care provider or a gynecologist to further discussed bacterial vaginosis if you continue to get recurrent infections.   It is also a possibility that you have an allergic reaction to any of the medicines that you have been prescribed - Everybody reacts differently to medications and while MOST people have no trouble with most medicines, you may have a reaction such as nausea, vomiting, rash, swelling, shortness of breath. If this is the case, please stop taking the medicine immediately and contact your physician.  ?

## 2019-06-07 NOTE — ED Notes (Signed)
Transported to ultrasound

## 2019-06-07 NOTE — ED Triage Notes (Signed)
Patient is coming of lower abd pain and vaginal discharge that started Sunday after having intercourse. Patient is concerned is related to her IUD since she has had complications in the past. Abd pain is described as aching and constant. Vaginal discharge is described as clear, thin, with no odor.

## 2019-06-07 NOTE — ED Provider Notes (Signed)
[place] DEPT Provider Note   CSN: [id number] Arrival date & time: 06/07/19  1237     History Chief Complaint  Patient presents with  . Abdominal Pain  . Vaginal Discharge    Tanya Bailey is a 34 y.o. female with past medical history significant for ectopic pregnancy, PID with ParaGard, chlamydia, anemia presents to emergency department today with chief complaint of intermittent abdominal pain and vaginal discharge x4 days.  She states the pain is located in her pelvic area and rates the pain 6 out of 10 in severity.  She describes it as a sharp and cramping sensation.  She is also describing a burning sensation that she feels located inside her vagina.  She states the pain started the day after having intercourse.  She denies having any pain during intercourse however.  She also endorses rare thin vaginal discharge without odor.  She did have 1 day of light spotting but states this is typical with her Mirena IUD that she currently has.  This IUD was placed she thinks in 2018.  She is concerned that there might be infection being caused by her IUD.  She states she has a history of PID with her ParaGard.  She later had a Mirena placed and it had a spontaneous expulsion.  Has not taken any medications for symptoms prior to arrival.  She denies any concern for STDs.  She states she has 1 sexual partner.  She denies any fever, chills, chest pain, shortness of breath, no odorous vaginal discharge, back pain, flank pain, gross hematuria, urinary frequency, dysuria, diarrhea.       Past Medical History:  Diagnosis Date  . Abnormal Pap smear   . Anemia   . Chlamydia   . Ectopic pregnancy   . Heart murmur    childhood  . PID (acute pelvic inflammatory disease)    with Paraguard  . Pregnancy induced hypertension    end of last preg  . Preterm labor   . Vaginal Pap smear, abnormal     Patient Active Problem List   Diagnosis Date Noted  . SVD  (spontaneous vaginal delivery) 01/20/2017  . Positive GBS test 01/19/2017  . Indication for care in labor and delivery, antepartum 01/14/2017  . History of preterm delivery, currently pregnant 10/29/2016  . Supervision of high risk pregnancy, antepartum, third trimester 08/31/2016    Past Surgical History:  Procedure Laterality Date  . NO PAST SURGERIES    . TOOTH EXTRACTION       OB History    Gravida  7   Para  6   Term  2   Preterm  4   AB  1   Living  5     SAB  0   TAB  0   Ectopic  1   Multiple  0   Live Births  6           Family History  Problem Relation Age of Onset  . Cancer Maternal Grandfather   . Prostate cancer Paternal Grandfather   . Cancer Paternal Grandfather   . Anesthesia problems Neg Hx   . Alcohol abuse Neg Hx   . Arthritis Neg Hx   . Asthma Neg Hx   . Birth defects Neg Hx   . COPD Neg Hx   . Depression Neg Hx   . Diabetes Neg Hx   . Drug abuse Neg Hx   . Early death Neg Hx   . Hearing  loss Neg Hx   . Heart disease Neg Hx   . Hyperlipidemia Neg Hx   . Hypertension Neg Hx   . Kidney disease Neg Hx   . Learning disabilities Neg Hx   . Mental illness Neg Hx   . Mental retardation Neg Hx   . Miscarriages / Stillbirths Neg Hx   . Stroke Neg Hx   . Vision loss Neg Hx   . Other Neg Hx     Social History   Tobacco Use  . Smoking status: Never Smoker  . Smokeless tobacco: Never Used  Substance Use Topics  . Alcohol use: No    Comment: Special occas. when not pregnant  . Drug use: No    Home Medications Prior to Admission medications   Medication Sig Start Date End Date Taking? Authorizing Provider  FENUGREEK PO Take 1 capsule by mouth 3 (three) times daily.    [provider]  hydrALAZINE (APRESOLINE) 10 MG tablet Take 1 tablet (10 mg total) by mouth 3 (three) times daily. 06/16/17   Raeford Razor, MD  ibuprofen (ADVIL) 800 MG tablet Take 1 tablet (800 mg total) by mouth 3 (three) times daily. 11/17/18    Wurst, [place], PA-C  metroNIDAZOLE (FLAGYL) 500 MG tablet Take 1 tablet (500 mg total) by mouth 2 (two) times daily. 06/07/19   Derra Shartzer E, PA-C  ondansetron (ZOFRAN) 4 MG tablet Take 1 tablet (4 mg total) by mouth every 6 (six) hours. 11/17/18   Wurst, [place], PA-C  Prenatal Vit-Fe Fumarate-FA (NAT-RUL PRENATAL VITAMINS) 28-0.8 MG TABS Take 1 tablet by mouth daily.     [provider]    Allergies    Patient has no known allergies.  Review of Systems   Review of Systems  All other systems are reviewed and are negative for acute change except as noted in the HPI.   Physical Exam Updated Vital Signs BP (!) 136/93 (BP Location: Right Arm)   Pulse 77   Temp 98.5 F (36.9 C) (Oral)   Resp 18   Ht 5\' 8"  (1.727 m)   Wt 86.2 kg   SpO2 100%   BMI 28.89 kg/m   Physical Exam Vitals and nursing note reviewed.  Constitutional:      General: She is not in acute distress.    Appearance: She is not ill-appearing.  HENT:     Head: Normocephalic and atraumatic.     Right Ear: Tympanic membrane and external ear normal.     Left Ear: Tympanic membrane and external ear normal.     Nose: Nose normal.     Mouth/Throat:     Mouth: Mucous membranes are moist.     Pharynx: Oropharynx is clear.  Eyes:     General: No scleral icterus.       Right eye: No discharge.        Left eye: No discharge.     Extraocular Movements: Extraocular movements intact.     Conjunctiva/sclera: Conjunctivae normal.     Pupils: Pupils are equal, round, and reactive to light.  Neck:     Vascular: No JVD.  Cardiovascular:     Rate and Rhythm: Normal rate and regular rhythm.     Pulses: Normal pulses.          Radial pulses are 2+ on the right side and 2+ on the left side.     Heart sounds: Normal heart sounds.  Pulmonary:     Comments: Lungs clear to auscultation in all  fields. Symmetric chest rise. No wheezing, rales, or rhonchi. Abdominal:     Tenderness: There is no right CVA tenderness  or left CVA tenderness.     Comments: Abdomen is soft, non-distended, and non-tender in all quadrants. No rigidity, no guarding. No peritoneal signs.  Genitourinary:    Comments: Normal external genitalia. No pain with speculum insertion. Closed cervical os with normal appearance - no rash or lesions. No significant bleeding noted from cervix or in vaginal vault.  Thin white vaginal discharge seen in vaginal vault.  Visualized strings of IUD.  On bimanual examination no adnexal tenderness or cervical motion tenderness. Chaperone Engineer, building services present during exam.  Musculoskeletal:        General: Normal range of motion.     Cervical back: Normal range of motion.  Skin:    General: Skin is warm and dry.     Capillary Refill: Capillary refill takes less than 2 seconds.  Neurological:     Mental Status: She is oriented to person, place, and time.     GCS: GCS eye subscore is 4. GCS verbal subscore is 5. GCS motor subscore is 6.     Comments: Fluent speech, no facial droop.  Psychiatric:        Behavior: Behavior normal.       ED Results / Procedures / Treatments   Labs (all labs ordered are listed, but only abnormal results are displayed) Labs Reviewed  WET PREP, GENITAL - Abnormal; Notable for the following components:      Result Value   Yeast Wet Prep HPF POC PRESENT (*)    Clue Cells Wet Prep HPF POC PRESENT (*)    WBC, Wet Prep HPF POC FEW (*)    All other components within normal limits  URINALYSIS, ROUTINE W REFLEX MICROSCOPIC  CBC WITH DIFFERENTIAL/PLATELET  COMPREHENSIVE METABOLIC PANEL  LIPASE, BLOOD  PREGNANCY, URINE  RPR  HIV ANTIBODY (ROUTINE TESTING W REFLEX)  GC/CHLAMYDIA PROBE AMP ([place]) NOT AT [facility]    EKG None  Radiology US PELVIC COMPLETE W TRANSVAGINAL AND TORSION R/O  Result Date: 06/07/2019 CLINICAL DATA:  Midline pelvic pain since Sunday, history of IUD and infection EXAM: TRANSABDOMINAL AND TRANSVAGINAL ULTRASOUND OF PELVIS DOPPLER ULTRASOUND OF  OVARIES TECHNIQUE: Both transabdominal and transvaginal ultrasound examinations of the pelvis were performed. Transabdominal technique was performed for global imaging of the pelvis including uterus, ovaries, adnexal regions, and pelvic cul-de-sac. It was necessary to proceed with endovaginal exam following the transabdominal exam to visualize the endometrium. Color and duplex Doppler ultrasound was utilized to evaluate blood flow to the ovaries. COMPARISON:  01/30/2012 FINDINGS: Uterus Measurements: 8.0 x 4.1 x 5.1 cm = volume: 86 mL. Anteverted. Normal morphology without mass Endometrium Thickness: 3 mm. Tiny amount of endometrial fluid. IUD identified within endometrial canal in expected position at the upper uterine segment. Right ovary Measurements: 2.8 x 1.7 x 1.8 cm = volume: 4.5 mL. Normal morphology without mass. Blood flow present within RIGHT ovary on color Doppler imaging. Left ovary Measurements: 2.7 x 1.5 x 2.4 cm = volume: 5.3 mL. Seen only on transabdominal imaging, obscured by bowel on transvaginal imaging. Normal morphology without mass. Blood flow present within LEFT ovary on color Doppler imaging. Pulsed Doppler evaluation of both ovaries demonstrates normal low-resistance arterial and venous waveforms bilaterally Other findings Trace free pelvic fluid.  No adnexal masses. IMPRESSION: IUD in expected position at the upper uterine segment endometrial canal. Otherwise normal exam. No evidence of ovarian mass or torsion.  Electronically Signed   By: Ulyses Southward M.D.   On: 06/07/2019 16:40    Procedures Procedures (including critical care time)  Medications Ordered in ED Medications  fluconazole (DIFLUCAN) tablet 150 mg (150 mg Oral Given 06/07/19 1717)  cefTRIAXone (ROCEPHIN) injection 500 mg (500 mg Intramuscular Given 06/07/19 1718)  azithromycin (ZITHROMAX) tablet 1,000 mg (1,000 mg Oral Given 06/07/19 1717)  ondansetron (ZOFRAN-ODT) disintegrating tablet 8 mg (8 mg Oral Given 06/07/19  1716)  sterile water (preservative free) injection (2.1 mLs  Given 06/07/19 1718)    ED Course  I have reviewed the triage vital signs and the nursing notes.  Pertinent labs & imaging results that were available during my care of the patient were reviewed by me and considered in my medical decision making (see chart for details).   MDM Rules/Calculators/A&P                      34  yo female presents with pelvic pain and concerns for IUD related infection.  She is afebrile well-appearing and in no acute distress.  Vital signs are stable.  On exam she has no abdominal tenderness.  She further describes her pelvic pain as a burning sensation in her  Labs today include CBC, CMP, lipase which are all unremarkable.  UA is without signs of infection.  Wet prep shows yeast, clue cells and few WBC.  Pelvic exam performed with chaperone.  I was able to visualize her IUD strings.  There was white discharge in the vaginal vault.  Exam not concerning for PID as she is hemodynamically stable and has no cervical motion tenderness on exam.  Ultrasound shows no sign of torsion and that IUD is in the correct location.  Pt understands that they have GC/Chlamydia cultures pending and that they will need to inform all sexual partners if results return positive. Pt has been treated prophylacticly with azithromycin and rocephin due to pts history, pelvic exam, and wet prep with increased WBCs.  She also given pill of Diflucan for yeast infection.  LFTs are normal here and there was only minimal amount of discharge seen on exam.  Feel that 1 dose is sufficient to treat this as an uncomplicated yeast infection.  Pt has also been given prescription for flagyl for Bacterial Vaginosis. Pt has been advised to not drink alcohol while on this medication. Patient to be discharged with instructions to follow up with OBGYN/PCP. Discussed importance of using protection when sexually active.   The patient appears reasonably  screened and/or stabilized for discharge and I doubt any other medical condition or other [facility] requiring further screening, evaluation, or treatment in the ED at this time prior to discharge. The patient is safe for discharge with strict return precautions discussed.     Portions of this note were generated with Scientist, clinical (histocompatibility and immunogenetics). Dictation errors may occur despite best attempts at proofreading.    Final Clinical Impression(s) / ED Diagnoses Final diagnoses:  BV (bacterial vaginosis)  Vaginal yeast infection    Rx / DC Orders ED Discharge Orders         Ordered    metroNIDAZOLE (FLAGYL) 500 MG tablet  2 times daily     06/07/19 1659           Renarda Mullinix, [place] 06/07/19 1802    Gerhard Munch, MD 06/08/19 1944

## 2019-06-08 LAB — RPR: RPR Ser Ql: NONREACTIVE

## 2019-06-09 LAB — GC/CHLAMYDIA PROBE AMP (~~LOC~~) NOT AT ARMC
Chlamydia: NEGATIVE
Neisseria Gonorrhea: NEGATIVE

## 2019-11-03 ENCOUNTER — Inpatient Hospital Stay (HOSPITAL_COMMUNITY)
Admission: AD | Admit: 2019-11-03 | Discharge: 2019-11-03 | Discharge status: Against Medical Advice | Payer: Self-pay | Attending: Family Medicine | Admitting: Family Medicine

## 2019-11-03 ENCOUNTER — Other Ambulatory Visit: Payer: Self-pay

## 2020-01-22 ENCOUNTER — Encounter (HOSPITAL_COMMUNITY): Payer: Self-pay

## 2020-01-22 ENCOUNTER — Emergency Department (HOSPITAL_COMMUNITY)
Admission: EM | Admit: 2020-01-22 | Discharge: 2020-01-22 | Disposition: A | Discharge status: Home / Self Care | Payer: Medicaid Other | Attending: Emergency Medicine | Admitting: Emergency Medicine

## 2020-01-22 DIAGNOSIS — N760 Acute vaginitis: Secondary | ICD-10-CM | POA: Diagnosis not present

## 2020-01-22 DIAGNOSIS — B9689 Other specified bacterial agents as the cause of diseases classified elsewhere: Secondary | ICD-10-CM | POA: Insufficient documentation

## 2020-01-22 LAB — URINALYSIS, ROUTINE W REFLEX MICROSCOPIC
Bilirubin Urine: NEGATIVE
Glucose, UA: NEGATIVE mg/dL
Hgb urine dipstick: NEGATIVE
Ketones, ur: NEGATIVE mg/dL
Leukocytes,Ua: NEGATIVE
Nitrite: NEGATIVE
Protein, ur: NEGATIVE mg/dL
Specific Gravity, Urine: 1.033 — ABNORMAL HIGH (ref 1.005–1.030)
pH: 6 (ref 5.0–8.0)

## 2020-01-22 LAB — WET PREP, GENITAL
Sperm: NONE SEEN
Trich, Wet Prep: NONE SEEN
WBC, Wet Prep HPF POC: NONE SEEN
Yeast Wet Prep HPF POC: NONE SEEN

## 2020-01-22 LAB — POC URINE PREG, ED: Preg Test, Ur: NEGATIVE

## 2020-01-22 MED ORDER — METRONIDAZOLE 500 MG PO TABS: 500.0000 mg | ORAL_TABLET | Freq: Two times a day (BID) | ORAL | 0 refills | Status: DC

## 2020-01-22 NOTE — ED Triage Notes (Signed)
Pt arrived via walk in, states her IUD fell out approx 1.5 wks ago, and has been having intermittent tingling and burning pain in her pelvic area since. Denies any bleeding/discharge, or urinary issues at this time.

## 2020-01-22 NOTE — ED Provider Notes (Signed)
[facility] EMERGENCY DEPARTMENT Provider Note  CSN: [id number] Arrival date & time: 01/22/20 1356    History Chief Complaint  Patient presents with  . Female GU Problem    HPI  FLONNIE WIERMAN is a 34 y.o. female reports her IUD fell out spontaneously about 10-14 days ago. She reports she had this particular IUD in for about 3 years without complications. She reports since it fell out she has had a burning sensation in her vulvar area, not associated with dysuria, hematuria, bleeding or discharge. No odor. She has not yet called her Gyn. She is concerned about an infection. Denies fever or pelvic pain.    Past Medical History:  Diagnosis Date  . Abnormal Pap smear   . Anemia   . Chlamydia   . Ectopic pregnancy   . Heart murmur    childhood  . PID (acute pelvic inflammatory disease)    with Paraguard  . Pregnancy induced hypertension    end of last preg  . Preterm labor   . Vaginal Pap smear, abnormal     Past Surgical History:  Procedure Laterality Date  . NO PAST SURGERIES    . TOOTH EXTRACTION      Family History  Problem Relation Age of Onset  . Cancer Maternal Grandfather   . Prostate cancer Paternal Grandfather   . Cancer Paternal Grandfather   . Anesthesia problems Neg Hx   . Alcohol abuse Neg Hx   . Arthritis Neg Hx   . Asthma Neg Hx   . Birth defects Neg Hx   . COPD Neg Hx   . Depression Neg Hx   . Diabetes Neg Hx   . Drug abuse Neg Hx   . Early death Neg Hx   . Hearing loss Neg Hx   . Heart disease Neg Hx   . Hyperlipidemia Neg Hx   . Hypertension Neg Hx   . Kidney disease Neg Hx   . Learning disabilities Neg Hx   . Mental illness Neg Hx   . Mental retardation Neg Hx   . Miscarriages / Stillbirths Neg Hx   . Stroke Neg Hx   . Vision loss Neg Hx   . Other Neg Hx     Social History   Tobacco Use  . Smoking status: Never Smoker  . Smokeless tobacco: Never Used  Vaping Use  . Vaping Use: Never used  Substance Use Topics  .  Alcohol use: No    Comment: Special occas. when not pregnant  . Drug use: No     Home Medications Prior to Admission medications   Medication Sig Start Date End Date Taking? Authorizing Provider  metroNIDAZOLE (FLAGYL) 500 MG tablet Take 1 tablet (500 mg total) by mouth 2 (two) times daily. 01/22/20   Pollyann Savoy, MD  hydrALAZINE (APRESOLINE) 10 MG tablet Take 1 tablet (10 mg total) by mouth 3 (three) times daily. 06/16/17 01/22/20  Raeford Razor, MD     Allergies    Patient has no known allergies.   Review of Systems   Review of Systems A comprehensive review of systems was completed and negative except as noted in HPI.    Physical Exam BP (!) 111/96 (BP Location: Right Arm)   Pulse 75   Temp 98.1 F (36.7 C) (Oral)   Resp 16   Ht 5' 7.75" (1.721 m)   Wt 86.2 kg   SpO2 96%   BMI 29.10 kg/m   Physical Exam Vitals and nursing  note reviewed.  Constitutional:      Appearance: Normal appearance.  HENT:     Head: Normocephalic and atraumatic.     Nose: Nose normal.     Mouth/Throat:     Mouth: Mucous membranes are moist.  Eyes:     Extraocular Movements: Extraocular movements intact.     Conjunctiva/sclera: Conjunctivae normal.  Cardiovascular:     Rate and Rhythm: Normal rate.  Pulmonary:     Effort: Pulmonary effort is normal.     Breath sounds: Normal breath sounds.  Abdominal:     General: Abdomen is flat.     Palpations: Abdomen is soft.     Tenderness: There is no abdominal tenderness.  Genitourinary:    Comments: Chaperone present. Normal external genitalia, small amount of thick white discharge, no signs of retained IUD, no tenderness on bimanual exam.  Musculoskeletal:        General: No swelling. Normal range of motion.     Cervical back: Neck supple.  Skin:    General: Skin is warm and dry.  Neurological:     General: No focal deficit present.     Mental Status: She is alert.  Psychiatric:        Mood and Affect: Mood normal.      ED  Results / Procedures / Treatments   Labs (all labs ordered are listed, but only abnormal results are displayed) Labs Reviewed  WET PREP, GENITAL - Abnormal; Notable for the following components:      Result Value   Clue Cells Wet Prep HPF POC PRESENT (*)    All other components within normal limits  URINALYSIS, ROUTINE W REFLEX MICROSCOPIC - Abnormal; Notable for the following components:   Specific Gravity, Urine 1.033 (*)    All other components within normal limits  POC URINE PREG, ED  GC/CHLAMYDIA PROBE AMP ([place]) NOT AT [facility]    EKG None  Radiology No results found.  Procedures Procedures  Medications Ordered in the ED Medications - No data to display   MDM Rules/Calculators/A&P MDM  ED Course  I have reviewed the triage vital signs and the nursing notes.  Pertinent labs & imaging results that were available during my care of the patient were reviewed by me and considered in my medical decision making (see chart for details).  Clinical Course as of Jan 21 1757  Mon Jan 22, 2020  1729 Wet Prep with some clue cells, no WBC, Trich or yeast.    [CS]  1754 UA is neg. Will treat for BV, patient will defer empiric treatment for GC/CT pending that result.    [CS]    Clinical Course User Index [CS] Pollyann Savoy, MD    Final Clinical Impression(s) / ED Diagnoses Final diagnoses:  Bacterial vaginosis    Rx / DC Orders ED Discharge Orders         Ordered    metroNIDAZOLE (FLAGYL) 500 MG tablet  2 times daily        01/22/20 1757           Pollyann Savoy, MD 01/22/20 1758

## 2020-01-25 LAB — GC/CHLAMYDIA PROBE AMP (~~LOC~~) NOT AT ARMC
Chlamydia: NEGATIVE
Comment: NEGATIVE
Comment: NORMAL
Neisseria Gonorrhea: NEGATIVE

## 2020-02-06 ENCOUNTER — Other Ambulatory Visit: Payer: Self-pay

## 2020-02-06 ENCOUNTER — Inpatient Hospital Stay (HOSPITAL_COMMUNITY)
Admission: AD | Admit: 2020-02-06 | Discharge: 2020-02-06 | Disposition: A | Discharge status: Home / Self Care | Payer: Medicaid Other | Attending: Obstetrics and Gynecology | Admitting: Obstetrics and Gynecology

## 2020-02-06 DIAGNOSIS — N939 Abnormal uterine and vaginal bleeding, unspecified: Secondary | ICD-10-CM

## 2020-02-06 DIAGNOSIS — N938 Other specified abnormal uterine and vaginal bleeding: Secondary | ICD-10-CM | POA: Insufficient documentation

## 2020-02-06 DIAGNOSIS — Z3202 Encounter for pregnancy test, result negative: Secondary | ICD-10-CM | POA: Diagnosis not present

## 2020-02-06 DIAGNOSIS — R109 Unspecified abdominal pain: Secondary | ICD-10-CM | POA: Insufficient documentation

## 2020-02-06 LAB — HCG, QUANTITATIVE, PREGNANCY: hCG, Beta Chain, Quant, S: 3 m[IU]/mL (ref <5)

## 2020-02-06 LAB — POCT PREGNANCY, URINE: Preg Test, Ur: NEGATIVE

## 2020-02-06 NOTE — Discharge Instructions (Signed)
Prenatal Care Providers           Center for [facility] Healthcare @ [facility] - accepts patients without insurance  Phone: [phone]  Center for [facility] Healthcare @ [facility]   Phone: [phone]  [facility] @[place]       Phone: [phone]            Center for [facility] @ [place]     Phone: [phone]          Center for [facility] Healthcare @ [facility]   Phone: [phone]  Center for [facility] @ [facility] - accepts patients without insurance  Phone: [phone]  Center for [facility] @ [facility] Phone: [phone]     [facility] Department - accepts patients without insurance Phone: [phone]  [facility]  Phone: [phone]  [phone] OB/GYN Phone: [phone]  [facility] Phone: [phone]  [facility] Phone: [phone]  [facility] Phone: [phone]  [facility]  Phone: [phone]        There are many options for quick evaluation and management of certain GYN issues that do not require a long wait time or big bill from the emergency department. Consider these options for your care in the future:   Walk-ins for certain complaints available at:   [facility] [address] [phone] See hours at [phone]  Center for [email or web address] at [employer] [facility]  [address]  [phone]  Center for [phone] at [employer] [phone]  You can make an appointment to see a GYN provider:   Center for [facility] Healthcare at [facility]  [address] [phone]  Center for [facility] Healthcare at [facility] [address] [place] [phone]  Center for [facility] Healthcare at [facility] [phone]  Center for [facility] Healthcare at [facility] [address] [phone]  If you already have an established OB/GYN provider in the area you can make an appointment with them as well.

## 2020-02-06 NOTE — MAU Provider Note (Signed)
First Provider Initiated Contact with Patient 02/06/20 1903      S [name] is a 34 y.o. [phone] patient who presents to MAU today with complaint of spotting and cramping. Patient reports two positive home pregnancy tests yesterday. LMP 01/07/2020. Patient reports normal monthly periods. Patient states she is here today to find out if she is pregnant as it is causing difficulty with her relationship. UPT negative in MAU.  O BP 127/79 (BP Location: Right Arm)   Pulse 65   Temp 98.7 F (37.1 C) (Oral)   Resp 20   Ht 5' 7.25" (1.708 m)   Wt 89.2 kg   LMP 01/07/2020   SpO2 100%   BMI 30.58 kg/m    Patient Vitals for the past 24 hrs:  BP Temp Temp src Pulse Resp SpO2 Height Weight  02/06/20 1846 127/79 98.7 F (37.1 C) Oral 65 20 100 % -- --  02/06/20 1841 -- -- -- -- -- -- 5' 7.25" (1.708 m) 89.2 kg   Physical Exam Vitals and nursing note reviewed.  Constitutional:      General: She is not in acute distress.    Appearance: Normal appearance. She is not ill-appearing, toxic-appearing or diaphoretic.  HENT:     Head: Normocephalic and atraumatic.  Pulmonary:     Effort: Pulmonary effort is normal.  Neurological:     Mental Status: She is alert and oriented to person, place, and time.  Psychiatric:        Mood and Affect: Mood normal.        Behavior: Behavior normal.        Thought Content: Thought content normal.        Judgment: Judgment normal.    A Medical screening exam complete Pregnancy test negative HCG performed  P Discharge from MAU in stable condition List of options for follow-up given Warning signs for worsening condition that would warrant emergency follow-up discussed Patient may return to MAU as needed   Revanth Neidig, Odie Sera, NP 02/06/2020 7:21 PM

## 2020-02-06 NOTE — MAU Note (Addendum)
Present with c/o spotting & abdominal cramping.  Reports spotting began today and mild abdominal cramping was yesterday.  Had 3 +HPT.  LMP 01/07/2020

## 2020-02-16 ENCOUNTER — Ambulatory Visit (INDEPENDENT_AMBULATORY_CARE_PROVIDER_SITE_OTHER): Payer: Medicaid Other | Admitting: Obstetrics

## 2020-02-16 ENCOUNTER — Encounter: Payer: Self-pay | Admitting: Obstetrics

## 2020-02-16 ENCOUNTER — Other Ambulatory Visit (HOSPITAL_COMMUNITY)
Admission: RE | Admit: 2020-02-16 | Discharge: 2020-02-16 | Disposition: A | Discharge status: Home / Self Care | Payer: Medicaid Other | Source: Ambulatory Visit | Attending: Obstetrics | Admitting: Obstetrics

## 2020-02-16 ENCOUNTER — Other Ambulatory Visit: Payer: Self-pay

## 2020-02-16 VITALS — BP 125/81 | HR 75 | Ht 67.0 in | Wt 197.0 lb

## 2020-02-16 DIAGNOSIS — Z01419 Encounter for gynecological examination (general) (routine) without abnormal findings: Secondary | ICD-10-CM | POA: Insufficient documentation

## 2020-02-16 DIAGNOSIS — N898 Other specified noninflammatory disorders of vagina: Secondary | ICD-10-CM | POA: Diagnosis not present

## 2020-02-16 DIAGNOSIS — N946 Dysmenorrhea, unspecified: Secondary | ICD-10-CM

## 2020-02-16 DIAGNOSIS — Z113 Encounter for screening for infections with a predominantly sexual mode of transmission: Secondary | ICD-10-CM | POA: Diagnosis not present

## 2020-02-16 DIAGNOSIS — B373 Candidiasis of vulva and vagina: Secondary | ICD-10-CM

## 2020-02-16 DIAGNOSIS — B3731 Acute candidiasis of vulva and vagina: Secondary | ICD-10-CM

## 2020-02-16 DIAGNOSIS — Z3009 Encounter for other general counseling and advice on contraception: Secondary | ICD-10-CM

## 2020-02-16 DIAGNOSIS — Z30011 Encounter for initial prescription of contraceptive pills: Secondary | ICD-10-CM

## 2020-02-16 MED ORDER — IBUPROFEN 800 MG PO TABS: 800.0000 mg | ORAL_TABLET | Freq: Three times a day (TID) | ORAL | 5 refills | Status: DC | PRN

## 2020-02-16 MED ORDER — FLUCONAZOLE 150 MG PO TABS: 150.0000 mg | ORAL_TABLET | Freq: Once | ORAL | 0 refills | Status: AC

## 2020-02-16 MED ORDER — NORETHINDRONE 0.35 MG PO TABS: 1.0000 | ORAL_TABLET | Freq: Every day | ORAL | 11 refills | Status: DC

## 2020-02-16 NOTE — Progress Notes (Signed)
Pt states she would like birth control.   Pt states she would like progestin only pill.

## 2020-02-16 NOTE — Progress Notes (Signed)
Subjective:        Tanya Bailey is a 34 y.o. female here for a routine exam.  Current complaints: Vaginal discharge.    Personal health questionnaire:  Is patient Ashkenazi Jewish, have a family history of breast and/or ovarian cancer: yes Is there a family history of uterine cancer diagnosed at age < 38, gastrointestinal cancer, urinary tract cancer, family member who is a Personnel officer syndrome-associated carrier: no Is the patient overweight and hypertensive, family history of diabetes, personal history of gestational diabetes, preeclampsia or PCOS: no Is patient over 97, have PCOS,  family history of premature CHD under age 48, diabetes, smoke, have hypertension or peripheral artery disease:  no At any time, has a partner hit, kicked or otherwise hurt or frightened you?: no Over the past 2 weeks, have you felt down, depressed or hopeless?: no Over the past 2 weeks, have you felt little interest or pleasure in doing things?:no   Gynecologic History Patient's last menstrual period was 02/06/2020. Contraception: none Last Pap: 2018. Results were: normal Last mammogram: n/a. Results were: /a  Obstetric History OB History  Gravida Para Term Preterm AB Living  7 6 2 4 1 5   SAB TAB Ectopic Multiple Live Births  0 0 1 0 6    # Outcome Date GA Lbr Len/2nd Weight Sex Delivery Anes PTL Lv  7 Preterm 01/20/17 [redacted]w[redacted]d 03:55 / 00:18 6 lb 5.9 oz (2.889 kg) F Vag-Spont None  LIV  6 Term 02/11/14 [redacted]w[redacted]d 04:58 / 00:06 6 lb 12.3 oz (3.07 kg) F Vag-Spont EPI  LIV  5 Ectopic 07/26/12 [redacted]w[redacted]d         4 Preterm 03/22/08 [redacted]w[redacted]d   M Vag-Spont None  LIV     Birth Comments: 36 weeks  3 Preterm 12/26/06 [redacted]w[redacted]d   F Vag-Spont None  LIV     Birth Comments: 35 weeks  2 Preterm 12/04/04 [redacted]w[redacted]d   M Vag-Spont None  DEC     Birth Comments: 23 weeks, contractions and water broke.  1 Term 07/20/03 [redacted]w[redacted]d   F Vag-Spont   LIV    Past Medical History:  Diagnosis Date  . Abnormal Pap smear   . Anemia   . Chlamydia    . Ectopic pregnancy   . Heart murmur    childhood  . PID (acute pelvic inflammatory disease)    with Paraguard  . Pregnancy induced hypertension    end of last preg  . Preterm labor   . Vaginal Pap smear, abnormal     Past Surgical History:  Procedure Laterality Date  . NO PAST SURGERIES    . TOOTH EXTRACTION       Current Outpatient Medications:  .  fluconazole (DIFLUCAN) 150 MG tablet, Take 1 tablet (150 mg total) by mouth once for 1 dose., Disp: 1 tablet, Rfl: 0 .  ibuprofen (ADVIL) 800 MG tablet, Take 1 tablet (800 mg total) by mouth every 8 (eight) hours as needed., Disp: 30 tablet, Rfl: 5 .  norethindrone (MICRONOR) 0.35 MG tablet, Take 1 tablet (0.35 mg total) by mouth daily., Disp: 28 tablet, Rfl: 11 Allergies  Allergen Reactions  . Latex     Social History   Tobacco Use  . Smoking status: Never Smoker  . Smokeless tobacco: Never Used  Substance Use Topics  . Alcohol use: No    Comment: Special occas. when not pregnant    Family History  Problem Relation Age of Onset  . Cancer Maternal Grandfather   .  Prostate cancer Paternal Grandfather   . Cancer Paternal Grandfather   . Anesthesia problems Neg Hx   . Alcohol abuse Neg Hx   . Arthritis Neg Hx   . Asthma Neg Hx   . Birth defects Neg Hx   . COPD Neg Hx   . Depression Neg Hx   . Diabetes Neg Hx   . Drug abuse Neg Hx   . Early death Neg Hx   . Hearing loss Neg Hx   . Heart disease Neg Hx   . Hyperlipidemia Neg Hx   . Hypertension Neg Hx   . Kidney disease Neg Hx   . Learning disabilities Neg Hx   . Mental illness Neg Hx   . Mental retardation Neg Hx   . Miscarriages / Stillbirths Neg Hx   . Stroke Neg Hx   . Vision loss Neg Hx   . Other Neg Hx       Review of Systems  Constitutional: negative for fatigue and weight loss Respiratory: negative for cough and wheezing Cardiovascular: negative for chest pain, fatigue and palpitations Gastrointestinal: negative for abdominal pain and change in  bowel habits Musculoskeletal:negative for myalgias Neurological: negative for gait problems and tremors Behavioral/Psych: negative for abusive relationship, depression Endocrine: negative for temperature intolerance    Genitourinary:negative for abnormal menstrual periods, genital lesions, hot flashes, sexual problems.  Positive for vaginal discharge Integument/breast: negative for breast lump, breast tenderness, nipple discharge and skin lesion(s)    Objective:       BP 125/81   Pulse 75   Ht 5\' 7"  (1.702 m)   Wt 197 lb (89.4 kg)   LMP 02/06/2020   BMI 30.85 kg/m  General:   alert and no distress  Skin:   no rash or abnormalities  Lungs:   clear to auscultation bilaterally  Heart:   regular rate and rhythm, S1, S2 normal, no murmur, click, rub or gallop  Breasts:   normal without suspicious masses, skin or nipple changes or axillary nodes  Abdomen:  normal findings: no organomegaly, soft, non-tender and no hernia  Pelvis:  External genitalia: normal general appearance Urinary system: urethral meatus normal and bladder without fullness, nontender Vaginal: normal without tenderness, induration or masses Cervix: normal appearance Adnexa: normal bimanual exam Uterus: anteverted and non-tender, normal size   Lab Review Urine pregnancy test Labs reviewed yes Radiologic studies reviewed no  50% of 20 min visit spent on counseling and coordination of care.   Assessment:    .1. Encounter for routine gynecological examination with Papanicolaou smear of cervix Rx: - Cytology - PAP( [facility])  2. Vaginal discharge Rx: - Cervicovaginal ancillary only( [place])  3. Candida vaginitis Rx: - fluconazole (DIFLUCAN) 150 MG tablet; Take 1 tablet (150 mg total) by mouth once for 1 dose.  Dispense: 1 tablet; Refill: 0  4. Screen for STD (sexually transmitted disease) Rx: - HIV Antibody (routine testing w rflx) - Hepatitis B surface antigen - RPR - Hepatitis C  antibody  5. Dysmenorrhea Rx: - ibuprofen (ADVIL) 800 MG tablet; Take 1 tablet (800 mg total) by mouth every 8 (eight) hours as needed.  Dispense: 30 tablet; Refill: 5  6. Encounter for other general counseling and advice on contraception - wants progestin-only pill because of side effects with E-P OCP's  7. Encounter for initial prescription of contraceptive pills Rx: - norethindrone (MICRONOR) 0.35 MG tablet; Take 1 tablet (0.35 mg total) by mouth daily.  Dispense: 28 tablet; Refill: 11  Plan:    Education reviewed: calcium supplements, depression evaluation, low fat, low cholesterol diet, safe sex/STD prevention, self breast exams and weight bearing exercise. Contraception: oral progesterone-only contraceptive. Follow up in: 1 year.   Meds ordered this encounter  Medications  . norethindrone (MICRONOR) 0.35 MG tablet    Sig: Take 1 tablet (0.35 mg total) by mouth daily.    Dispense:  28 tablet    Refill:  11  . ibuprofen (ADVIL) 800 MG tablet    Sig: Take 1 tablet (800 mg total) by mouth every 8 (eight) hours as needed.    Dispense:  30 tablet    Refill:  5  . fluconazole (DIFLUCAN) 150 MG tablet    Sig: Take 1 tablet (150 mg total) by mouth once for 1 dose.    Dispense:  1 tablet    Refill:  0   Orders Placed This Encounter  Procedures  . HIV Antibody (routine testing w rflx)  . Hepatitis B surface antigen  . RPR  . Hepatitis C antibody    Brock Bad, MD 02/16/2020 11:30 AM

## 2020-02-20 LAB — CERVICOVAGINAL ANCILLARY ONLY
Bacterial Vaginitis (gardnerella): POSITIVE — AB
Candida Glabrata: NEGATIVE
Candida Vaginitis: NEGATIVE
Chlamydia: POSITIVE — AB
Comment: NEGATIVE
Comment: NEGATIVE
Comment: NEGATIVE
Comment: NEGATIVE
Comment: NEGATIVE
Comment: NORMAL
Neisseria Gonorrhea: NEGATIVE
Trichomonas: NEGATIVE

## 2020-02-20 LAB — CYTOLOGY - PAP
Comment: NEGATIVE
Diagnosis: UNDETERMINED — AB
High risk HPV: NEGATIVE

## 2020-02-21 ENCOUNTER — Other Ambulatory Visit: Payer: Self-pay | Admitting: Obstetrics

## 2020-02-21 DIAGNOSIS — N760 Acute vaginitis: Secondary | ICD-10-CM

## 2020-02-21 DIAGNOSIS — A749 Chlamydial infection, unspecified: Secondary | ICD-10-CM

## 2020-02-21 DIAGNOSIS — B9689 Other specified bacterial agents as the cause of diseases classified elsewhere: Secondary | ICD-10-CM

## 2020-02-21 MED ORDER — METRONIDAZOLE 500 MG PO TABS: 500.0000 mg | ORAL_TABLET | Freq: Two times a day (BID) | ORAL | 2 refills | Status: DC

## 2020-02-21 MED ORDER — DOXYCYCLINE HYCLATE 100 MG PO CAPS: 100.0000 mg | ORAL_CAPSULE | Freq: Two times a day (BID) | ORAL | 0 refills | Status: DC

## 2020-02-21 NOTE — Progress Notes (Signed)
Doxycycline

## 2020-02-22 ENCOUNTER — Other Ambulatory Visit: Payer: Self-pay

## 2020-02-22 ENCOUNTER — Encounter: Payer: Self-pay | Admitting: Physician Assistant

## 2020-02-22 ENCOUNTER — Ambulatory Visit: Payer: Medicaid Other | Admitting: Physician Assistant

## 2020-02-22 DIAGNOSIS — Z113 Encounter for screening for infections with a predominantly sexual mode of transmission: Secondary | ICD-10-CM | POA: Diagnosis not present

## 2020-02-22 DIAGNOSIS — A749 Chlamydial infection, unspecified: Secondary | ICD-10-CM | POA: Diagnosis not present

## 2020-02-22 NOTE — Progress Notes (Signed)
Patient here for STD screening.Burnette Valenti Brewer-Jensen, RN 

## 2020-02-22 NOTE — Progress Notes (Signed)
S: 34 yo woman here for STI eval. Had annual physical with PCP 02/16/20 with neg HIV, RPR, Hep B, Hep C testing. Was treated for vaginal yeast then. Pap ASCUS with HPV neg, but pos chlamydia and BV, with neg gonorrhea. They notified her yesterday of these results and prescribed doxycycline and metronidazole for the infections. She has not started them. She states her sole female partner had STD eval here yesterday after her diagnoses, with negative test results. She does not know if he indicated his partner had chlamydia. She feels well, denies symptoms. Prior GC chlamydia testing was neg in September. O: pleasant woman, seems anxious. Chart review confirms information as above. No physical exam or additional labs indicated. A/P: Chlamydia infection - take medications as instructed by Dr. Clearance Coots (doxy and Flagyl). Verify partner is being treated as contact to chlamydia, and if not, encourage him to seek treatment. No sex for 7 days assuming both parties are treated. Recommend chlamydia TOC in 1 month. Pt states understanding and is in agreement with this plan.

## 2020-04-27 HISTORY — PX: TUBAL LIGATION: SHX77

## 2020-05-28 ENCOUNTER — Inpatient Hospital Stay (HOSPITAL_COMMUNITY)
Admission: AD | Admit: 2020-05-28 | Discharge: 2020-05-28 | Disposition: A | Discharge status: Home / Self Care | Payer: Medicaid Other | Attending: Obstetrics and Gynecology | Admitting: Obstetrics and Gynecology

## 2020-05-28 ENCOUNTER — Other Ambulatory Visit: Payer: Self-pay

## 2020-05-28 DIAGNOSIS — N912 Amenorrhea, unspecified: Secondary | ICD-10-CM | POA: Diagnosis not present

## 2020-05-28 NOTE — MAU Note (Signed)
Presents for pregnancy confirmation.  Reports had +HPT today.  States was taking BCP's, but hadn't taken since end of December beginning of January.  LMP 04/30/2020.  Denies VB or pain.

## 2020-05-28 NOTE — MAU Provider Note (Signed)
[name] is a 35 y.o. [id number] at Unknown who presents to MAU today for pregnancy verification. The patient denies abdominal pain or vaginal bleeding today.   BP (!) 127/59 (BP Location: Right Arm)   Pulse 77   Temp 98.5 F (36.9 C) (Oral)   Resp 18   Ht 5\' 8"  (1.727 m)   Wt 85.1 kg   LMP 04/30/2020   SpO2 100%   BMI 28.54 kg/m   CONSTITUTIONAL: Well-developed, well-nourished female in no acute distress.  MUSCULOSKELETAL: Normal range of motion.  CARDIOVASCULAR: Regular heart rate RESPIRATORY: Normal effort NEUROLOGICAL: Alert and oriented to person, place, and time.  SKIN: No pallor. PSYCH: Normal mood and affect. Normal behavior. Normal judgment and thought content.  No results found for this or any previous visit (from the past 24 hour(s)).  MDM Patient advised that without concerning symptoms today UPT will not be performed in MAU at this time  A: Amenorrhea  P: Discharge home Pt advised that routine pregnancy tests are offered in the office Follow up at [facility] Patient may return to MAU as needed or if her condition were to change or worsen   [facility], Donette Larry  05/28/2020 11:52 AM

## 2020-05-29 ENCOUNTER — Ambulatory Visit (INDEPENDENT_AMBULATORY_CARE_PROVIDER_SITE_OTHER): Payer: Medicaid Other

## 2020-05-29 VITALS — BP 126/80 | HR 105 | Ht 68.0 in | Wt 188.0 lb

## 2020-05-29 DIAGNOSIS — Z3480 Encounter for supervision of other normal pregnancy, unspecified trimester: Secondary | ICD-10-CM

## 2020-05-29 DIAGNOSIS — O0990 Supervision of high risk pregnancy, unspecified, unspecified trimester: Secondary | ICD-10-CM

## 2020-05-29 DIAGNOSIS — Z3201 Encounter for pregnancy test, result positive: Secondary | ICD-10-CM

## 2020-05-29 LAB — POCT URINE PREGNANCY: Preg Test, Ur: POSITIVE — AB

## 2020-05-29 NOTE — Progress Notes (Signed)
Tanya Bailey presents today for UPT. She has no unusual complaints.  LMP:04/30/2020    OBJECTIVE: Appears well, in no apparent distress.  OB History    Gravida  8   Para  6   Term  2   Preterm  4   AB  1   Living  5     SAB  0   IAB  0   Ectopic  1   Multiple  0   Live Births  6          Home UPT Result:POSITIVE X3 In-Office UPT result: POSITIVE  I have reviewed the patient's medical, obstetrical, social, and family histories, and medications.   ASSESSMENT: Positive pregnancy test, hx Preterm deliveries.  PLAN Prenatal care to be completed at: [facility] __________________________________________________  PRENATAL INTAKE SUMMARY  Tanya Bailey presents today New OB Nurse Interview.  OB History    Gravida  8   Para  6   Term  2   Preterm  4   AB  1   Living  5     SAB  0   IAB  0   Ectopic  1   Multiple  0   Live Births  6          I have reviewed the patient's medical, obstetrical, social, and family histories, medications, and available lab results.  SUBJECTIVE She has no unusual complaints  OBJECTIVE Initial Nurse Intake (New OB)  GENERAL APPEARANCE: alert, well appearing   ASSESSMENT Normal pregnancy LMP:  04/30/2020 EDD:  02/04/2021 GA       [redacted]w[redacted]d PHQ-9=3   PLAN Prenatal care at [facility] OB PNL will be done at [facility] visit with Sharen Counter on 07/17/2020 Pt already own BP Cuff

## 2020-06-17 ENCOUNTER — Inpatient Hospital Stay (HOSPITAL_COMMUNITY)
Admission: AD | Admit: 2020-06-17 | Discharge: 2020-06-17 | Disposition: A | Discharge status: Home / Self Care | Payer: Medicaid Other | Attending: Family Medicine | Admitting: Family Medicine

## 2020-06-17 ENCOUNTER — Encounter (HOSPITAL_COMMUNITY): Payer: Self-pay | Admitting: Family Medicine

## 2020-06-17 ENCOUNTER — Inpatient Hospital Stay (HOSPITAL_COMMUNITY): Payer: Medicaid Other

## 2020-06-17 ENCOUNTER — Other Ambulatory Visit: Payer: Self-pay

## 2020-06-17 DIAGNOSIS — O0990 Supervision of high risk pregnancy, unspecified, unspecified trimester: Secondary | ICD-10-CM

## 2020-06-17 DIAGNOSIS — R1032 Left lower quadrant pain: Secondary | ICD-10-CM | POA: Insufficient documentation

## 2020-06-17 DIAGNOSIS — O208 Other hemorrhage in early pregnancy: Secondary | ICD-10-CM | POA: Diagnosis not present

## 2020-06-17 DIAGNOSIS — O418X10 Other specified disorders of amniotic fluid and membranes, first trimester, not applicable or unspecified: Secondary | ICD-10-CM

## 2020-06-17 DIAGNOSIS — R519 Headache, unspecified: Secondary | ICD-10-CM | POA: Diagnosis not present

## 2020-06-17 DIAGNOSIS — Z3481 Encounter for supervision of other normal pregnancy, first trimester: Secondary | ICD-10-CM | POA: Insufficient documentation

## 2020-06-17 DIAGNOSIS — O468X1 Other antepartum hemorrhage, first trimester: Secondary | ICD-10-CM | POA: Diagnosis not present

## 2020-06-17 DIAGNOSIS — R109 Unspecified abdominal pain: Secondary | ICD-10-CM | POA: Diagnosis not present

## 2020-06-17 DIAGNOSIS — O26891 Other specified pregnancy related conditions, first trimester: Secondary | ICD-10-CM

## 2020-06-17 DIAGNOSIS — Z3A01 Less than 8 weeks gestation of pregnancy: Secondary | ICD-10-CM | POA: Diagnosis not present

## 2020-06-17 DIAGNOSIS — O26899 Other specified pregnancy related conditions, unspecified trimester: Secondary | ICD-10-CM

## 2020-06-17 LAB — CBC
HCT: 32.8 % — ABNORMAL LOW (ref 36.0–46.0)
Hemoglobin: 11.6 g/dL — ABNORMAL LOW (ref 12.0–15.0)
MCH: 32 pg (ref 26.0–34.0)
MCHC: 35.4 g/dL (ref 30.0–36.0)
MCV: 90.6 fL (ref 80.0–100.0)
Platelets: 175 10*3/uL (ref 150–400)
RBC: 3.62 MIL/uL — ABNORMAL LOW (ref 3.87–5.11)
RDW: 12.3 % (ref 11.5–15.5)
WBC: 6 10*3/uL (ref 4.0–10.5)
nRBC: 0 % (ref 0.0–0.2)

## 2020-06-17 LAB — URINALYSIS, ROUTINE W REFLEX MICROSCOPIC
Bilirubin Urine: NEGATIVE
Glucose, UA: NEGATIVE mg/dL
Hgb urine dipstick: NEGATIVE
Ketones, ur: NEGATIVE mg/dL
Leukocytes,Ua: NEGATIVE
Nitrite: NEGATIVE
Protein, ur: NEGATIVE mg/dL
Specific Gravity, Urine: 1.026 (ref 1.005–1.030)
pH: 7 (ref 5.0–8.0)

## 2020-06-17 LAB — HCG, QUANTITATIVE, PREGNANCY: hCG, Beta Chain, Quant, S: 119589 m[IU]/mL — ABNORMAL HIGH (ref <5)

## 2020-06-17 MED ORDER — DEXAMETHASONE SODIUM PHOSPHATE 10 MG/ML IJ SOLN
10.0000 mg | Freq: Once | INTRAMUSCULAR | Status: AC
  Administered 2020-06-17: 10 mg via INTRAVENOUS
  Filled 2020-06-17: qty 1

## 2020-06-17 MED ORDER — ACETAMINOPHEN 500 MG PO TABS
1000.0000 mg | ORAL_TABLET | Freq: Once | ORAL | Status: AC
  Administered 2020-06-17: 1000 mg via ORAL
  Filled 2020-06-17: qty 2

## 2020-06-17 MED ORDER — LACTATED RINGERS IV BOLUS
1000.0000 mL | Freq: Once | INTRAVENOUS | Status: AC
  Administered 2020-06-17: 1000 mL via INTRAVENOUS

## 2020-06-17 MED ORDER — METOCLOPRAMIDE HCL 5 MG/ML IJ SOLN
10.0000 mg | Freq: Once | INTRAMUSCULAR | Status: AC
  Administered 2020-06-17: 10 mg via INTRAVENOUS
  Filled 2020-06-17: qty 2

## 2020-06-17 NOTE — Discharge Instructions (Signed)
Subchorionic Hematoma  A hematoma is a collection of blood outside of the blood vessels. A subchorionic hematoma is a collection of blood between the outer wall of the embryo (chorion) and the inner wall of the uterus. This condition can cause vaginal bleeding. Early small hematomas usually shrink on their own and do not affect your baby or pregnancy. When bleeding starts later in pregnancy, or if the hematoma is larger or occurs in older pregnant women, the condition may be more serious. Larger hematomas increase the chances of miscarriage. This condition also increases the risk of:  Premature separation of the placenta from the uterus.  Premature (preterm) labor.  Stillbirth. What are the causes? The exact cause of this condition is not known. It occurs when blood is trapped between the placenta and the uterine wall because the placenta has separated from the original site of implantation. What increases the risk? You are more likely to develop this condition if:  You were treated with fertility medicines.  You became pregnant through in vitro fertilization (IVF). What are the signs or symptoms? Symptoms of this condition include:  Vaginal spotting or bleeding.  Abdominal pain. This is rare. Sometimes you may have no symptoms and the bleeding may only be seen when ultrasound images are taken (transvaginal ultrasound). How is this diagnosed? This condition is diagnosed based on a physical exam. This includes a pelvic exam. You may also have other tests, including:  Blood tests.  Urine tests.  Ultrasound of the abdomen. How is this treated? Treatment for this condition can vary. Treatment may include:  Watchful waiting. You will be monitored closely for any changes in bleeding.  Medicines.  Activity restriction. This may be needed until the bleeding stops.  A medicine called Rh immunoglobulin. This is given if you have an Rh-negative blood type. It prevents Rh  sensitization. Follow these instructions at home:  Stay on bed rest if told to do so by your health care provider.  Do not lift anything that is heavier than 10 lb (4.5 kg), or the limit that you are told by your health care provider.  Track and write down the number of pads you use each day and how soaked (saturated) they are.  Do not use tampons.  Keep all follow-up visits. This is important. Your health care provider may ask you to have follow-up blood tests or ultrasound tests or both. Contact a health care provider if:  You have any vaginal bleeding.  You have a fever. Get help right away if:  You have severe cramps in your stomach, back, abdomen, or pelvis.  You pass large clots or tissue. Save any tissue for your health care provider to look at.  You faint.  You become light-headed or weak. Summary  A subchorionic hematoma is a collection of blood between the outer wall of the embryo (chorion) and the inner wall of the uterus.  This condition can cause vaginal bleeding.  Sometimes you may have no symptoms and the bleeding may only be seen when ultrasound images are taken.  Treatment may include watchful waiting, medicines, or activity restriction.  Keep all follow-up visits. Get help right away if you have severe cramps or heavy vaginal bleeding. This information is not intended to replace advice given to you by your health care provider. Make sure you discuss any questions you have with your health care provider. Document Revised: 01/08/2020 Document Reviewed: 01/08/2020 Elsevier Patient Education  2021 Elsevier Inc.   

## 2020-06-17 NOTE — MAU Provider Note (Signed)
History     CSN: [id number]  Arrival date and time: 06/17/20 1133   Event Date/Time   First Provider Initiated Contact with Patient 06/17/20 1234       Chief Complaint  Patient presents with  . Headache  . Abdominal Pain   HPI This is a 35yo [id number] at [redacted]w[redacted]d by LMP. Has history of ectopic pregnancy, treated with MTX in 2014. Presents with headache that started this morning when she woke up. Hasn't taken anything or eaten anything. Also had abdominal pain LLQ that started this morning. No Palliating or provoking factors.    OB History    Gravida  8   Para  6   Term  2   Preterm  4   AB  1   Living  5     SAB  0   IAB  0   Ectopic  1   Multiple  0   Live Births  6           Past Medical History:  Diagnosis Date  . Abnormal Pap smear   . Anemia   . Chlamydia   . Ectopic pregnancy   . Heart murmur    childhood  . PID (acute pelvic inflammatory disease)    with Paraguard  . Pregnancy induced hypertension    end of last preg  . Preterm labor   . Vaginal Pap smear, abnormal     Past Surgical History:  Procedure Laterality Date  . NO PAST SURGERIES    . TOOTH EXTRACTION      Family History  Problem Relation Age of Onset  . Cancer Maternal Grandfather   . Prostate cancer Paternal Grandfather   . Cancer Paternal Grandfather   . Anesthesia problems Neg Hx   . Alcohol abuse Neg Hx   . Arthritis Neg Hx   . Asthma Neg Hx   . Birth defects Neg Hx   . COPD Neg Hx   . Depression Neg Hx   . Diabetes Neg Hx   . Drug abuse Neg Hx   . Early death Neg Hx   . Hearing loss Neg Hx   . Heart disease Neg Hx   . Hyperlipidemia Neg Hx   . Hypertension Neg Hx   . Kidney disease Neg Hx   . Learning disabilities Neg Hx   . Mental illness Neg Hx   . Mental retardation Neg Hx   . Miscarriages / Stillbirths Neg Hx   . Stroke Neg Hx   . Vision loss Neg Hx   . Other Neg Hx     Social History   Tobacco Use  . Smoking status: Never Smoker  . Smokeless  tobacco: Never Used  Vaping Use  . Vaping Use: Never used  Substance Use Topics  . Alcohol use: No    Comment: Special occas. when not pregnant  . Drug use: No    Allergies:  Allergies  Allergen Reactions  . Latex     Medications Prior to Admission  Medication Sig Dispense Refill Last Dose  . Prenatal Vit-Fe Fumarate-FA (MULTIVITAMIN-PRENATAL) 27-0.8 MG TABS tablet Take 1 tablet by mouth daily at 12 noon.   06/17/2020 at Unknown time    Review of Systems  All other systems reviewed and are negative.  Physical Exam   Blood pressure 117/67, pulse 70, temperature 98.4 F (36.9 C), temperature source Oral, resp. rate 18, height 6\' 4"  (1.93 m), weight 88.2 kg, last menstrual period 04/30/2020, SpO2 100 %.  Physical  Exam Vitals and nursing note reviewed.  Constitutional:      Appearance: She is well-developed.  HENT:     Head: Normocephalic and atraumatic.  Cardiovascular:     Rate and Rhythm: Normal rate and regular rhythm.     Heart sounds: Normal heart sounds. No murmur heard. No friction rub. No gallop.   Pulmonary:     Effort: Pulmonary effort is normal. No respiratory distress.     Breath sounds: Normal breath sounds. No wheezing.  Abdominal:     General: Bowel sounds are normal. There is no distension.     Palpations: Abdomen is soft. There is no mass.     Tenderness: There is abdominal tenderness in the left lower quadrant. There is no guarding or rebound.  Skin:    General: Skin is warm and dry.  Neurological:     Mental Status: She is alert.  Psychiatric:        Mood and Affect: Mood normal.        Speech: Speech normal.        Behavior: Behavior normal.    Results for orders placed or performed during the hospital encounter of 06/17/20 (from the past 24 hour(s))  Urinalysis, Routine w reflex microscopic Urine, Clean Catch     Status: None   Collection Time: 06/17/20 11:51 AM  Result Value Ref Range   Color, Urine YELLOW YELLOW   APPearance CLEAR CLEAR    Specific Gravity, Urine 1.026 1.005 - 1.030   pH 7.0 5.0 - 8.0   Glucose, UA NEGATIVE NEGATIVE mg/dL   Hgb urine dipstick NEGATIVE NEGATIVE   Bilirubin Urine NEGATIVE NEGATIVE   Ketones, ur NEGATIVE NEGATIVE mg/dL   Protein, ur NEGATIVE NEGATIVE mg/dL   Nitrite NEGATIVE NEGATIVE   Leukocytes,Ua NEGATIVE NEGATIVE  CBC     Status: Abnormal   Collection Time: 06/17/20 12:11 PM  Result Value Ref Range   WBC 6.0 4.0 - 10.5 K/uL   RBC 3.62 (L) 3.87 - 5.11 MIL/uL   Hemoglobin 11.6 (L) 12.0 - 15.0 g/dL   HCT 14.4 (L) 81.8 - 56.3 %   MCV 90.6 80.0 - 100.0 fL   MCH 32.0 26.0 - 34.0 pg   MCHC 35.4 30.0 - 36.0 g/dL   RDW 14.9 70.2 - 63.7 %   Platelets 175 150 - 400 K/uL   nRBC 0.0 0.0 - 0.2 %   US OB LESS THAN 14 WEEKS WITH OB TRANSVAGINAL  Result Date: 06/17/2020 CLINICAL DATA:  Abdominal cramping EXAM: OBSTETRIC <14 WK [place] AND TRANSVAGINAL OB US TECHNIQUE: Both transabdominal and transvaginal ultrasound examinations were performed for complete evaluation of the gestation as well as the maternal uterus, adnexal regions, and pelvic cul-de-sac. Transvaginal technique was performed to assess early pregnancy. COMPARISON:  None. FINDINGS: Intrauterine gestational sac: Visualized-single. Mild debris seen within the gestational sac. Yolk sac:  Visualized Embryo:  Visualized Cardiac Activity: Visualized Heart Rate: 106 bpm CRL:  5 mm   6 w   1 d                  [place] EDC: February 09, 2021 Subchorionic hemorrhage: There is an area of somewhat mixed decreased echogenicity adjacent to the gestational sac measuring 3.2 x 3.2 x 2.3 cm which on real-time imaging has a somewhat swirling type appearance which may indicate focal active hemorrhage. Maternal uterus/adnexae: Cervical os is closed. Left ovary measures 3.6 x 2.0 x 2.2 cm. Right ovary measures 3.6 x 1.8 x 1.6 cm. No extrauterine  pelvic mass or fluid. IMPRESSION: 1. Single live intrauterine gestation with estimated gestational age of [redacted] weeks. Mild debris is  seen in the gestational sac. 2. Mixed echogenicity adjacent to the gestational sac measuring 3.2 x 3.2 x 2.3 cm which has a somewhat swirling type appearance on real-time interrogation. There may be focal degree of active hemorrhage with developing hematoma in this area. Close clinical and imaging assessment in this regard may well be warranted. 3.  No extrauterine pelvic mass.  No free pelvic fluid. These results will be called to the ordering clinician or representative by the Radiologist Assistant, and communication documented in the PACS or [employer]. Electronically Signed   By: Bretta Bang III M.D.   On: 06/17/2020 13:32      MAU Course  Procedures  MDM 1215 - Will given IVF , Reglan 10mg , Decadron 10mg , tyelnol 1000mg . 1330 - Called by Radiology to relay results of . 3cm subchorionic hemorrhage, possibly active. 1345 - rechecked patient: headache and abdominal pain improving. Discussed results with patient. Ectopic pregnancy r/o.  Assessment and Plan  1. Supervision of high risk pregnancy, antepartum  2. Abdominal pain affecting pregnancy - OB LESS THAN 14 WEEKS WITH OB TRANSVAGINAL; Standing - OB LESS THAN 14 WEEKS WITH OB TRANSVAGINAL  3. [redacted] weeks gestation of pregnancy  4. Acute intractable headache, unspecified headache type  5. Subchorionic hematoma in first trimester, single or unspecified fetus  Pelvic rest, lifting precautions. Return precautions given. Discharge to home.  [place] 06/17/2020, 12:34 PM

## 2020-06-17 NOTE — MAU Note (Signed)
Presents with c/o H/A and abdominal pain.  States she woke up with H/A that will not go away, hasn't taken any meds.  Also reports abdominal cramping below bellybutton, but above pelvis, describes pain as "being cut with a kinife".  Denies VB.

## 2020-07-10 ENCOUNTER — Ambulatory Visit: Payer: Medicaid Other

## 2020-07-10 DIAGNOSIS — O0990 Supervision of high risk pregnancy, unspecified, unspecified trimester: Secondary | ICD-10-CM

## 2020-07-10 NOTE — Progress Notes (Signed)
Last pap 10/22/201 abnormal (ASC-US)  Repeat 02/15/2021

## 2020-07-17 ENCOUNTER — Other Ambulatory Visit (HOSPITAL_COMMUNITY)
Admission: RE | Admit: 2020-07-17 | Discharge: 2020-07-17 | Disposition: A | Discharge status: Home / Self Care | Payer: Medicaid Other | Source: Ambulatory Visit | Attending: Certified Nurse Midwife | Admitting: Certified Nurse Midwife

## 2020-07-17 ENCOUNTER — Encounter: Payer: Self-pay | Admitting: Certified Nurse Midwife

## 2020-07-17 ENCOUNTER — Other Ambulatory Visit: Payer: Self-pay

## 2020-07-17 ENCOUNTER — Ambulatory Visit (INDEPENDENT_AMBULATORY_CARE_PROVIDER_SITE_OTHER): Payer: Medicaid Other | Admitting: Certified Nurse Midwife

## 2020-07-17 VITALS — BP 112/70 | HR 69 | Wt 200.0 lb

## 2020-07-17 DIAGNOSIS — O09529 Supervision of elderly multigravida, unspecified trimester: Secondary | ICD-10-CM

## 2020-07-17 DIAGNOSIS — Z3009 Encounter for other general counseling and advice on contraception: Secondary | ICD-10-CM | POA: Diagnosis not present

## 2020-07-17 DIAGNOSIS — Z3481 Encounter for supervision of other normal pregnancy, first trimester: Secondary | ICD-10-CM

## 2020-07-17 DIAGNOSIS — O0990 Supervision of high risk pregnancy, unspecified, unspecified trimester: Secondary | ICD-10-CM

## 2020-07-17 DIAGNOSIS — O09899 Supervision of other high risk pregnancies, unspecified trimester: Secondary | ICD-10-CM

## 2020-07-17 DIAGNOSIS — Z3A11 11 weeks gestation of pregnancy: Secondary | ICD-10-CM

## 2020-07-17 NOTE — Patient Instructions (Signed)
https://www.cdc.gov/pregnancy/infections.html">  First Trimester of Pregnancy  The first trimester of pregnancy starts on the first day of your last menstrual period until the end of week 12. This is also called months 1 through 3 of pregnancy. Body changes during your first trimester Your body goes through many changes during pregnancy. The changes usually return to normal after your baby is born. Physical changes  You may gain or lose weight.  Your breasts may grow larger and hurt. The area around your nipples may get darker.  Dark spots or blotches may develop on your face.  You may have changes in your hair. Health changes  You may feel like you might vomit (nauseous), and you may vomit.  You may have heartburn.  You may have headaches.  You may have trouble pooping (constipation).  Your gums may bleed. Other changes  You may get tired easily.  You may pee (urinate) more often.  Your menstrual periods will stop.  You may not feel hungry.  You may want to eat certain kinds of food.  You may have changes in your emotions from day to day.  You may have more dreams. Follow these instructions at home: Medicines  Take over-the-counter and prescription medicines only as told by your doctor. Some medicines are not safe during pregnancy.  Take a prenatal vitamin that contains at least 600 micrograms (mcg) of folic acid. Eating and drinking  Eat healthy meals that include: ? Fresh fruits and vegetables. ? Whole grains. ? Good sources of protein, such as meat, eggs, or tofu. ? Low-fat dairy products.  Avoid raw meat and unpasteurized juice, milk, and cheese.  If you feel like you may vomit, or you vomit: ? Eat 4 or 5 small meals a day instead of 3 large meals. ? Try eating a few soda crackers. ? Drink liquids between meals instead of during meals.  You may need to take these actions to prevent or treat trouble pooping: ? Drink enough fluids to keep your pee  (urine) pale yellow. ? Eat foods that are high in fiber. These include beans, whole grains, and fresh fruits and vegetables. ? Limit foods that are high in fat and sugar. These include fried or sweet foods. Activity  Exercise only as told by your doctor. Most people can do their usual exercise routine during pregnancy.  Stop exercising if you have cramps or pain in your lower belly (abdomen) or low back.  Do not exercise if it is too hot or too humid, or if you are in a place of great height (high altitude).  Avoid heavy lifting.  If you choose to, you may have sex unless your doctor tells you not to. Relieving pain and discomfort  Wear a good support bra if your breasts are sore.  Rest with your legs raised (elevated) if you have leg cramps or low back pain.  If you have bulging veins (varicose veins) in your legs: ? Wear support hose as told by your doctor. ? Raise your feet for 15 minutes, 3-4 times a day. ? Limit salt in your food. Safety  Wear your seat belt at all times when you are in a car.  Talk with your doctor if someone is hurting you or yelling at you.  Talk with your doctor if you are feeling sad or have thoughts of hurting yourself. Lifestyle  Do not use hot tubs, steam rooms, or saunas.  Do not douche. Do not use tampons or scented sanitary pads.  Do not   use herbal medicines, illegal drugs, or medicines that are not approved by your doctor. Do not drink alcohol.  Do not smoke or use any products that contain nicotine or tobacco. If you need help quitting, ask your doctor.  Avoid cat litter boxes and soil that is used by cats. These carry germs that can cause harm to the baby and can cause a loss of your baby by miscarriage or stillbirth. General instructions  Keep all follow-up visits. This is important.  Ask for help if you need counseling or if you need help with nutrition. Your doctor can give you advice or tell you where to go for help.  Visit your  dentist. At home, brush your teeth with a soft toothbrush. Floss gently.  Write down your questions. Take them to your prenatal visits. Where to find more information  American Pregnancy Association: americanpregnancy.org  [employer]ians [employer]necologists: www.acog.org  Office on Women's Health: MightyReward.co.nz Contact a doctor if:  You are dizzy.  You have a fever.  You have mild cramps or pressure in your lower belly.  You have a nagging pain in your belly area.  You continue to feel like you may vomit, you vomit, or you have watery poop (diarrhea) for 24 hours or longer.  You have a bad-smelling fluid coming from your vagina.  You have pain when you pee.  You are exposed to a disease that spreads from person to person, such as chickenpox, measles, Zika virus, HIV, or hepatitis. Get help right away if:  You have spotting or bleeding from your vagina.  You have very bad belly cramping or pain.  You have shortness of breath or chest pain.  You have any kind of injury, such as from a fall or a car crash.  You have new or increased pain, swelling, or redness in an arm or leg. Summary  The first trimester of pregnancy starts on the first day of your last menstrual period until the end of week 12 (months 1 through 3).  Eat 4 or 5 small meals a day instead of 3 large meals.  Do not smoke or use any products that contain nicotine or tobacco. If you need help quitting, ask your doctor.  Keep all follow-up visits. This information is not intended to replace advice given to you by your health care provider. Make sure you discuss any questions you have with your health care provider. Document Revised: 09/20/2019 Document Reviewed: 07/27/2019 Elsevier Patient Education  2021 Elsevier Inc.   Preventing Preterm Birth Preterm birth is when a baby is delivered between 20 weeks and 37 weeks of pregnancy. A full-term pregnancy lasts for at least 37  weeks. Preterm birth can be dangerous for your baby because the last few weeks of pregnancy are an important time for your baby to grow and reach a normal birth weight. How can preterm birth affect my baby? Complications of preterm birth may include:  Breathing problems.  Brain damage that affects movement and coordination (cerebral palsy).  Trouble with feeding.  Problems with vision or hearing.  Infections or inflammation of the digestive tract (colitis).  Developmental delays and learning disabilities.  Low birth weight or very low birth weight.  Higher risk for diabetes, heart disease, and high blood pressure later in life. What can increase my risk of having a preterm birth? The exact cause of preterm birth is unknown. The following factors make you more likely to have a preterm birth:  Being diagnosed with placenta  previa. This is a condition in which the placenta covers the lowest part of your uterus (cervix), which opens into the vagina.  Certain conditions of your current and past pregnancies, such as: ? Having had a preterm birth before. ? Being pregnant with multiples. ? Waiting less than 6 months between giving birth and becoming pregnant again. ? Certain abnormalities in your unborn baby. ? Vaginal bleeding during pregnancy. ? Becoming pregnant through in vitro fertilization (IVF).  Being overweight or underweight.  Medical history of: ? STIs (sexually transmitted infections) or other infections of the urinary tract and the vagina. ? Long-term (chronic) illnesses, such as blood clotting problems, diabetes, or high blood pressure. ? Short cervix.  Lifestyle and environmental factors, such as: ? Using tobacco products or drugs. ? Drinking alcohol. ? Having stress and no social support. ? Violence in the home (domestic violence). ? Being exposed to certain chemicals or pollutants in the environment. What actions can I take to prevent preterm birth? Medical  care The most important thing you can do to lower your risk for preterm birth is to get routine medical care during pregnancy (prenatal care). Keep all follow-up visits as told by your health care provider. This is important.  If you have a high risk of preterm birth:  You may be referred to a health care provider who specializes in managing high-risk pregnancies (perinatologist).  You may be given medicine to help prevent preterm birth. Lifestyle Certain lifestyle changes can also lower your risk of preterm birth:  Wait at least 6 months after a pregnancy to become pregnant again.  Get to a healthy weight before getting pregnant. If you are overweight, work with your health care provider to safely lose weight.  Do not use any products that contain nicotine or tobacco, such as cigarettes, e-cigarettes, and chewing tobacco. If you need help quitting, ask your health care provider.  Do not drink alcohol.  Do not use drugs.  Eat a healthy diet.  Manage other medical problems, such as diabetes or high blood pressure.   Where to find support For more support, consider:  Talking with your health care provider.  Talking with a therapist or substance abuse counselor, if you need help quitting.  Working with a Data processing manager or a Systems analyst to maintain a healthy weight.  Joining a support group. Where to find more information Learn more about preventing preterm birth from:  Centers for Disease Control and Prevention: TonerPromos.no  March of Dimes: marchofdimes.org  American Pregnancy Association: americanpregnancy.org Contact a health care provider if: You have any of the following signs or symptoms of preterm labor before 37 weeks:  A change or increase in vaginal discharge.  Fluid leaking from your vagina.  Pressure or cramps in your lower abdomen.  A backache that does not go away or gets worse.  Regular tightening (contractions) in your lower abdomen. Get help right away  if:  You are having regular painful contractions every 5 minutes or less.  Your water breaks. Summary  Preterm birth means having your baby during weeks 20-37 of pregnancy.  Preterm birth may put your baby at risk for physical and mental problems.  The exact cause of preterm birth is unknown. However, being diagnosed with placenta previa or having vaginal bleeding or an STI (sexually transmitted infection) increases your risk for preterm birth.  Getting good prenatal care can help prevent preterm birth. Keep all follow-up visits as told by your health care provider. This is important.  Contact a  health care provider if you have signs or symptoms of preterm labor. This information is not intended to replace advice given to you by your health care provider. Make sure you discuss any questions you have with your health care provider. Document Revised: 03/20/2019 Document Reviewed: 03/20/2019 Elsevier Patient Education  2021 [employer].

## 2020-07-17 NOTE — Progress Notes (Signed)
NEW OB, last PAP 10/22/02021. C/o  Nervous because of [place] results 06/17/20.

## 2020-07-17 NOTE — Progress Notes (Signed)
History:   Tanya Bailey is a 35 y.o. [phone] at [redacted]w[redacted]d by LMP being seen today for her first obstetrical visit.  Her obstetrical history is significant for advanced maternal age and preterm labor. Patient does intend to breast feed. Pregnancy history fully reviewed.  Patient reports no complaints.     HISTORY: OB History  Gravida Para Term Preterm AB Living  8 6 2 4 1 5   SAB IAB Ectopic Multiple Live Births  0 0 1 0 6    # Outcome Date GA Lbr Len/2nd Weight Sex Delivery Anes PTL Lv  8 Current           7 Preterm 01/20/17 [redacted]w[redacted]d 03:55 / 00:18 6 lb 5.9 oz (2.889 kg) F Vag-Spont None  LIV     Name: Klepacki,GIRL Portia     Apgar1: 9  Apgar5: 9  6 Term 02/11/14 [redacted]w[redacted]d 04:58 / 00:06 6 lb 12.3 oz (3.07 kg) F Vag-Spont EPI  LIV     Name: Betty,GIRL Nacole     Apgar1: 8  Apgar5: 9  5 Ectopic 07/26/12 [redacted]w[redacted]d         4 Preterm 03/22/08 [redacted]w[redacted]d   M Vag-Spont None  LIV     Birth Comments: 36 weeks  3 Preterm 12/26/06 [redacted]w[redacted]d   F Vag-Spont None  LIV     Birth Comments: 35 weeks  2 Preterm 12/04/04 [redacted]w[redacted]d   M Vag-Spont None  DEC     Birth Comments: 23 weeks, contractions and water broke.  1 Term 07/20/03 [redacted]w[redacted]d   F Vag-Spont   LIV    Last pap smear was done 02/16/20 and was normal  Past Medical History:  Diagnosis Date  . Abnormal Pap smear   . Anemia   . Chlamydia   . Ectopic pregnancy   . Heart murmur    childhood  . Indication for care in labor and delivery, antepartum 01/14/2017  . PID (acute pelvic inflammatory disease)    with Paraguard  . Positive GBS test 01/19/2017  . Pregnancy induced hypertension    end of last preg  . Preterm labor   . Supervision of high risk pregnancy, antepartum, third trimester 08/31/2016    Clinic [facility]  Prenatal Labs Dating LMP, 6 week 10/31/2016 06/25/16 Blood type: --/--/O POS (09/20 0340)  Genetic Screen   AFP/ Quad:   negative   Antibody:NEG (09/20 0340) Anatomic 01-11-1997 normal Rubella: 2.03 (05/07 1051) GTT  Third trimester: nl 2hr RPR: Non Reactive (09/20  0340)  Flu vaccine Declined HBsAg: Negative (05/07 1051)  TDaP vaccine   11/19/16                                       Rhogam:n/a O+ HI  . SVD (spontaneous vaginal delivery) 01/20/2017  . Vaginal Pap smear, abnormal    Past Surgical History:  Procedure Laterality Date  . NO PAST SURGERIES    . TOOTH EXTRACTION     Family History  Problem Relation Age of Onset  . Cancer Maternal Grandfather   . Prostate cancer Paternal Grandfather   . Cancer Paternal Grandfather   . Anesthesia problems Neg Hx   . Alcohol abuse Neg Hx   . Arthritis Neg Hx   . Asthma Neg Hx   . Birth defects Neg Hx   . COPD Neg Hx   . Depression Neg Hx   . Diabetes Neg Hx   .  Drug abuse Neg Hx   . Early death Neg Hx   . Hearing loss Neg Hx   . Heart disease Neg Hx   . Hyperlipidemia Neg Hx   . Hypertension Neg Hx   . Kidney disease Neg Hx   . Learning disabilities Neg Hx   . Mental illness Neg Hx   . Mental retardation Neg Hx   . Miscarriages / Stillbirths Neg Hx   . Stroke Neg Hx   . Vision loss Neg Hx   . Other Neg Hx    Social History   Tobacco Use  . Smoking status: Never Smoker  . Smokeless tobacco: Never Used  Vaping Use  . Vaping Use: Never used  Substance Use Topics  . Alcohol use: No    Comment: Special occas. when not pregnant  . Drug use: No   Allergies  Allergen Reactions  . Latex    Current Outpatient Medications on File Prior to Visit  Medication Sig Dispense Refill  . Prenatal Vit-Fe Fumarate-FA (MULTIVITAMIN-PRENATAL) 27-0.8 MG TABS tablet Take 1 tablet by mouth daily at 12 noon.    . [DISCONTINUED] hydrALAZINE (APRESOLINE) 10 MG tablet Take 1 tablet (10 mg total) by mouth 3 (three) times daily. 90 tablet 0   No current facility-administered medications on file prior to visit.    Review of Systems Pertinent items noted in HPI and remainder of comprehensive ROS otherwise negative.  Physical Exam:   Vitals:   07/17/20 1018  BP: 112/70  Pulse: 69  Weight: 200 lb (90.7 kg)    Fetal Heart Rate (bpm): 170 [place]  General: well-developed, well-nourished female in no acute distress  Breasts:  normal appearance, no masses or tenderness bilaterally  Skin: normal coloration and turgor, no rashes  Neurologic: oriented, normal, negative, normal mood  Extremities: normal strength, tone, and muscle mass, ROM of all joints is normal  HEENT PERRLA, extraocular movement intact and sclera clear  Neck supple and no masses  Cardiovascular: regular rate and rhythm  Respiratory:  no respiratory distress, normal breath sounds  Abdomen: soft, non-tender; bowel sounds normal; no masses,  no organomegaly  Pelvic: Blind swabs obtained, cervix closed/thick     Assessment:    Pregnancy: [id number] Patient Active Problem List   Diagnosis Date Noted  . Unwanted fertility 07/17/2020  . Supervision of high risk pregnancy, antepartum 05/29/2020  . History of preterm delivery, currently pregnant 10/29/2016     Plan:    1. Supervision of high risk pregnancy, antepartum - Welcomed back to practice and introduced self to patient  - Reviewed safety, visitor policy, reassurance about COVID-19 for pregnancy at this time. Discussed possible changes to visits, including televisits, that may occur due to COVID-19.  The office remains open if pt needs to be seen and MAU is open 24 hours/day for OB emergencies. - Anticipatory guidance on upcoming appointments  - High risk pregnancy and precautions on reasons to present to MAU discussed  - Cervicovaginal ancillary only( [facility]) - Genetic Screening - Culture, OB Urine - CBC/D/Plt+RPR+Rh+ABO+Rub Ab...  2. History of preterm delivery, currently pregnant - Hx of PTL and PTD x 4 - Educated and discussed 17P injections starting at 16 weeks  - Makena ordered in office today    3. [redacted] weeks gestation of pregnancy  4. Unwanted fertility - Patient request tubal, patient wants entire tube removed - BTS, consent signed on 07/17/20  5. Antepartum  multigravida of advanced maternal age - Patient 35y.o at time of delivery  Initial labs drawn. Continue prenatal vitamins. Problem list reviewed and updated. Genetic Screening discussed, NIPS: ordered. Ultrasound discussed; fetal anatomic survey: requested. Anticipatory guidance about prenatal visits given including labs, ultrasounds, and testing. Discussed usage of Babyscripts and virtual visits as additional source of managing and completing prenatal visits in midst of coronavirus and pandemic.   Encouraged to complete MyChart Registration for her ability to review results, send requests, and have questions addressed.  The nature of [facility] with multiple MDs and Advanced Practice Providers was explained to patient; also emphasized that residents, students are part of our team. Routine obstetric precautions reviewed. Encouraged to seek out care at office or emergency room [facility] MAU preferred) for urgent and/or emergent concerns. Return in about 4 weeks (around 08/14/2020) for HROB, in person.     Sharyon Cable, CNM [facility] [employer], [facility]

## 2020-07-18 LAB — HCV INTERPRETATION

## 2020-07-18 LAB — CBC/D/PLT+RPR+RH+ABO+RUB AB...
Antibody Screen: NEGATIVE
Basophils Absolute: 0 10*3/uL (ref 0.0–0.2)
Basos: 1 %
EOS (ABSOLUTE): 0 10*3/uL (ref 0.0–0.4)
Eos: 0 %
HCV Ab: <0.1 s/co ratio (ref 0.0–0.9)
HIV Screen 4th Generation wRfx: NONREACTIVE
Hematocrit: 33.3 % — ABNORMAL LOW (ref 34.0–46.6)
Hemoglobin: 11.4 g/dL (ref 11.1–15.9)
Hepatitis B Surface Ag: NEGATIVE
Immature Grans (Abs): 0 10*3/uL (ref 0.0–0.1)
Immature Granulocytes: 1 %
Lymphocytes Absolute: 1.9 10*3/uL (ref 0.7–3.1)
Lymphs: 25 %
MCH: 31.1 pg (ref 26.6–33.0)
MCHC: 34.2 g/dL (ref 31.5–35.7)
MCV: 91 fL (ref 79–97)
Monocytes Absolute: 0.6 10*3/uL (ref 0.1–0.9)
Monocytes: 8 %
Neutrophils Absolute: 4.8 10*3/uL (ref 1.4–7.0)
Neutrophils: 65 %
Platelets: 180 10*3/uL (ref 150–450)
RBC: 3.67 x10E6/uL — ABNORMAL LOW (ref 3.77–5.28)
RDW: 12.7 % (ref 11.7–15.4)
RPR Ser Ql: NONREACTIVE
Rh Factor: POSITIVE
Rubella Antibodies, IGG: 1.7 index (ref >0.99)
WBC: 7.4 10*3/uL (ref 3.4–10.8)

## 2020-07-18 LAB — CERVICOVAGINAL ANCILLARY ONLY
Chlamydia: NEGATIVE
Comment: NEGATIVE
Comment: NEGATIVE
Comment: NORMAL
Neisseria Gonorrhea: NEGATIVE
Trichomonas: NEGATIVE

## 2020-07-19 LAB — CULTURE, OB URINE

## 2020-07-19 LAB — URINE CULTURE, OB REFLEX

## 2020-07-25 ENCOUNTER — Encounter: Payer: Self-pay | Admitting: Certified Nurse Midwife

## 2020-07-26 ENCOUNTER — Encounter: Payer: Self-pay | Admitting: Certified Nurse Midwife

## 2020-08-01 ENCOUNTER — Encounter: Payer: Self-pay | Admitting: Certified Nurse Midwife

## 2020-08-04 ENCOUNTER — Inpatient Hospital Stay (HOSPITAL_COMMUNITY): Payer: Medicaid Other

## 2020-08-04 ENCOUNTER — Inpatient Hospital Stay (HOSPITAL_COMMUNITY)
Admission: AD | Admit: 2020-08-04 | Discharge: 2020-08-04 | Disposition: A | Discharge status: Home / Self Care | Payer: Medicaid Other | Attending: Obstetrics & Gynecology | Admitting: Obstetrics & Gynecology

## 2020-08-04 ENCOUNTER — Other Ambulatory Visit: Payer: Self-pay

## 2020-08-04 ENCOUNTER — Encounter (HOSPITAL_COMMUNITY): Payer: Self-pay | Admitting: Obstetrics & Gynecology

## 2020-08-04 DIAGNOSIS — O09521 Supervision of elderly multigravida, first trimester: Secondary | ICD-10-CM | POA: Diagnosis not present

## 2020-08-04 DIAGNOSIS — Z3A13 13 weeks gestation of pregnancy: Secondary | ICD-10-CM | POA: Insufficient documentation

## 2020-08-04 DIAGNOSIS — O418X20 Other specified disorders of amniotic fluid and membranes, second trimester, not applicable or unspecified: Secondary | ICD-10-CM

## 2020-08-04 DIAGNOSIS — O208 Other hemorrhage in early pregnancy: Secondary | ICD-10-CM | POA: Insufficient documentation

## 2020-08-04 DIAGNOSIS — R109 Unspecified abdominal pain: Secondary | ICD-10-CM | POA: Insufficient documentation

## 2020-08-04 DIAGNOSIS — O209 Hemorrhage in early pregnancy, unspecified: Secondary | ICD-10-CM

## 2020-08-04 DIAGNOSIS — O09291 Supervision of pregnancy with other poor reproductive or obstetric history, first trimester: Secondary | ICD-10-CM | POA: Diagnosis not present

## 2020-08-04 DIAGNOSIS — O26891 Other specified pregnancy related conditions, first trimester: Secondary | ICD-10-CM | POA: Diagnosis not present

## 2020-08-04 LAB — WET PREP, GENITAL
Clue Cells Wet Prep HPF POC: NONE SEEN
Sperm: NONE SEEN
Trich, Wet Prep: NONE SEEN
Yeast Wet Prep HPF POC: NONE SEEN

## 2020-08-04 LAB — URINALYSIS, ROUTINE W REFLEX MICROSCOPIC
Bilirubin Urine: NEGATIVE
Glucose, UA: NEGATIVE mg/dL
Hgb urine dipstick: NEGATIVE
Ketones, ur: NEGATIVE mg/dL
Leukocytes,Ua: NEGATIVE
Nitrite: NEGATIVE
Protein, ur: NEGATIVE mg/dL
Specific Gravity, Urine: 1.016 (ref 1.005–1.030)
pH: 7 (ref 5.0–8.0)

## 2020-08-04 NOTE — MAU Note (Signed)
Pt reports she used bathroom @0415  and saw spotting (quarter size, red and brown), has been having cramping and pressure started yesterday. Denies clots.

## 2020-08-04 NOTE — Discharge Instructions (Signed)
Subchorionic Hematoma  A hematoma is a collection of blood outside of the blood vessels. A subchorionic hematoma is a collection of blood between the outer wall of the embryo (chorion) and the inner wall of the uterus. This condition can cause vaginal bleeding. Early small hematomas usually shrink on their own and do not affect your baby or pregnancy. When bleeding starts later in pregnancy, or if the hematoma is larger or occurs in older pregnant women, the condition may be more serious. Larger hematomas increase the chances of miscarriage. This condition also increases the risk of:  Premature separation of the placenta from the uterus.  Premature (preterm) labor.  Stillbirth. What are the causes? The exact cause of this condition is not known. It occurs when blood is trapped between the placenta and the uterine wall because the placenta has separated from the original site of implantation. What increases the risk? You are more likely to develop this condition if:  You were treated with fertility medicines.  You became pregnant through in vitro fertilization (IVF). What are the signs or symptoms? Symptoms of this condition include:  Vaginal spotting or bleeding.  Abdominal pain. This is rare. Sometimes you may have no symptoms and the bleeding may only be seen when ultrasound images are taken (transvaginal ultrasound). How is this diagnosed? This condition is diagnosed based on a physical exam. This includes a pelvic exam. You may also have other tests, including:  Blood tests.  Urine tests.  Ultrasound of the abdomen. How is this treated? Treatment for this condition can vary. Treatment may include:  Watchful waiting. You will be monitored closely for any changes in bleeding.  Medicines.  Activity restriction. This may be needed until the bleeding stops.  A medicine called Rh immunoglobulin. This is given if you have an Rh-negative blood type. It prevents Rh  sensitization. Follow these instructions at home:  Stay on bed rest if told to do so by your health care provider.  Do not lift anything that is heavier than 10 lb (4.5 kg), or the limit that you are told by your health care provider.  Track and write down the number of pads you use each day and how soaked (saturated) they are.  Do not use tampons.  Keep all follow-up visits. This is important. Your health care provider may ask you to have follow-up blood tests or ultrasound tests or both. Contact a health care provider if:  You have any vaginal bleeding.  You have a fever. Get help right away if:  You have severe cramps in your stomach, back, abdomen, or pelvis.  You pass large clots or tissue. Save any tissue for your health care provider to look at.  You faint.  You become light-headed or weak. Summary  A subchorionic hematoma is a collection of blood between the outer wall of the embryo (chorion) and the inner wall of the uterus.  This condition can cause vaginal bleeding.  Sometimes you may have no symptoms and the bleeding may only be seen when ultrasound images are taken.  Treatment may include watchful waiting, medicines, or activity restriction.  Keep all follow-up visits. Get help right away if you have severe cramps or heavy vaginal bleeding. This information is not intended to replace advice given to you by your health care provider. Make sure you discuss any questions you have with your health care provider. Document Revised: 01/08/2020 Document Reviewed: 01/08/2020 Elsevier Patient Education  2021 Elsevier Inc.   

## 2020-08-04 NOTE — MAU Provider Note (Signed)
History     CSN: [id number]  Arrival date and time: 08/04/20 0456   Event Date/Time   First Provider Initiated Contact with Patient 08/04/20 [phone]      Chief Complaint  Patient presents with  . Abdominal Pain   HPI Tanya Bailey is a 35 y.o. [id number] at [redacted]w[redacted]d who presents with vaginal bleeding. She reports going to the bathroom at 0415 and seeing some spotting when she wiped. She states it was red and brown. She is unsure if she is still bleeding. She reports intermittent lower abdominal cramping.   OB History    Gravida  8   Para  6   Term  2   Preterm  4   AB  1   Living  5     SAB  0   IAB  0   Ectopic  1   Multiple  0   Live Births  6           Past Medical History:  Diagnosis Date  . Abnormal Pap smear   . Anemia   . Chlamydia   . Ectopic pregnancy   . Heart murmur    childhood  . Indication for care in labor and delivery, antepartum 01/14/2017  . PID (acute pelvic inflammatory disease)    with Paraguard  . Positive GBS test 01/19/2017  . Pregnancy induced hypertension    end of last preg  . Preterm labor   . Supervision of high risk pregnancy, antepartum, third trimester 08/31/2016    Clinic [facility]  Prenatal Labs Dating LMP, 6 week [place] 06/25/16 Blood type: --/--/O POS (09/20 0340)  Genetic Screen   AFP/ Quad:   negative   Antibody:NEG (09/20 0340) Anatomic [place] normal Rubella: 2.03 (05/07 1051) GTT  Third trimester: nl 2hr RPR: Non Reactive (09/20 0340)  Flu vaccine Declined HBsAg: Negative (05/07 1051)  TDaP vaccine   11/19/16                                       Rhogam:n/a O+ HI  . SVD (spontaneous vaginal delivery) 01/20/2017  . Vaginal Pap smear, abnormal     Past Surgical History:  Procedure Laterality Date  . NO PAST SURGERIES    . TOOTH EXTRACTION      Family History  Problem Relation Age of Onset  . Cancer Maternal Grandfather   . Prostate cancer Paternal Grandfather   . Cancer Paternal Grandfather   . Anesthesia problems Neg Hx    . Alcohol abuse Neg Hx   . Arthritis Neg Hx   . Asthma Neg Hx   . Birth defects Neg Hx   . COPD Neg Hx   . Depression Neg Hx   . Diabetes Neg Hx   . Drug abuse Neg Hx   . Early death Neg Hx   . Hearing loss Neg Hx   . Heart disease Neg Hx   . Hyperlipidemia Neg Hx   . Hypertension Neg Hx   . Kidney disease Neg Hx   . Learning disabilities Neg Hx   . Mental illness Neg Hx   . Mental retardation Neg Hx   . Miscarriages / Stillbirths Neg Hx   . Stroke Neg Hx   . Vision loss Neg Hx   . Other Neg Hx     Social History   Tobacco Use  . Smoking status: Never Smoker  . Smokeless  tobacco: Never Used  Vaping Use  . Vaping Use: Never used  Substance Use Topics  . Alcohol use: No    Comment: Special occas. when not pregnant  . Drug use: No    Allergies:  Allergies  Allergen Reactions  . Latex     Medications Prior to Admission  Medication Sig Dispense Refill Last Dose  . Prenatal Vit-Fe Fumarate-FA (MULTIVITAMIN-PRENATAL) 27-0.8 MG TABS tablet Take 1 tablet by mouth daily at 12 noon.   08/03/2020 at Unknown time    Review of Systems  Constitutional: Negative.  Negative for fatigue and fever.  HENT: Negative.   Respiratory: Negative.  Negative for shortness of breath.   Cardiovascular: Negative.  Negative for chest pain.  Gastrointestinal: Positive for abdominal pain. Negative for constipation, diarrhea, nausea and vomiting.  Genitourinary: Positive for vaginal bleeding. Negative for dysuria.  Neurological: Negative.  Negative for dizziness and headaches.   Physical Exam   Blood pressure (P) 120/77, pulse (P) 71, temperature (P) 98.3 F (36.8 C), temperature source (P) Oral, resp. rate (P) 18, last menstrual period 04/30/2020, SpO2 (P) 100 %.  Physical Exam Vitals and nursing note reviewed.  Constitutional:      General: She is not in acute distress.    Appearance: She is well-developed.  HENT:     Head: Normocephalic.  Eyes:     Pupils: Pupils are equal,  round, and reactive to light.  Cardiovascular:     Rate and Rhythm: Normal rate and regular rhythm.     Heart sounds: Normal heart sounds.  Pulmonary:     Effort: Pulmonary effort is normal. No respiratory distress.     Breath sounds: Normal breath sounds.  Abdominal:     General: Bowel sounds are normal. There is no distension.     Palpations: Abdomen is soft.     Tenderness: There is no abdominal tenderness.  Genitourinary:    Comments: SSE: Small amount of dark brown discharge in vault, no active bleeding, cervix visually closed Skin:    General: Skin is warm and dry.  Neurological:     Mental Status: She is alert and oriented to person, place, and time.  Psychiatric:        Behavior: Behavior normal.        Thought Content: Thought content normal.        Judgment: Judgment normal.    Cervix: closed/thick/posterior  FHT: 170 bpm  MAU Course  Procedures Results for orders placed or performed during the hospital encounter of 08/04/20 (from the past 24 hour(s))  Wet prep, genital     Status: Abnormal   Collection Time: 08/04/20  6:05 AM  Result Value Ref Range   Yeast Wet Prep HPF POC NONE SEEN NONE SEEN   Trich, Wet Prep NONE SEEN NONE SEEN   Clue Cells Wet Prep HPF POC NONE SEEN NONE SEEN   WBC, Wet Prep HPF POC MODERATE (A) NONE SEEN   Sperm NONE SEEN    US OB Comp Less 14 Wks  Result Date: 08/04/2020 CLINICAL DATA:  35 year old pregnant female presenting with spotting and pelvic cramping. EXAM: OBSTETRIC <14 WK [place] AND TRANSVAGINAL OB US TECHNIQUE: Both transabdominal and transvaginal ultrasound examinations were performed for complete evaluation of the gestation as well as the maternal uterus, adnexal regions, and pelvic cul-de-sac. Transvaginal technique was performed to assess early pregnancy. COMPARISON:  06/17/2020. FINDINGS: Intrauterine gestational sac: Single Yolk sac:  None Embryo:  Present Cardiac Activity: Present Heart Rate: 153 bpm CRL:  66.6  mm   12 w   6 d                   [place] EDC: 02/10/2021. Subchorionic hemorrhage: Small hypoechoic collection measuring 2.0 x 2.8 x 3.0 cm. Maternal uterus/adnexae: Bilateral ovaries are normal in appearance. No significant volume of free fluid. IMPRESSION: 1. Single viable IUP with estimated gestational age of [redacted] weeks and 6 days and normal fetal heart rate of 153 beats per minute. 2. Small subchorionic hemorrhage. Electronically Signed   By: Trudie Reed M.D.   On: 08/04/2020 06:45    MDM UA Wet prep and gc/chlamydia US OB transvaginal O Positive blood type  Reviewed results of subchorionic hemorrhage with patient. Discussed that this is a common finding in the first trimester and does not usually cause problems in the pregnancy like loss or difficulty with development. Reviewed expectations for vaginal bleeding including a small amount possibly for several weeks. Reviewed warning signs of heavy bleeding, saturating a pad in less than an hour, and severe pain as reasons to come back to MAU. Encouraged patient to exercise pelvic rest until 7 days after bleeding stops. Patient verbalized understanding.    Assessment and Plan   1. Subchorionic hemorrhage of placenta in second trimester, single or unspecified fetus   2. Vaginal bleeding affecting early pregnancy   3. [redacted] weeks gestation of pregnancy    -Discharge home in stable condition -Bleeding precautions discussed -Patient advised to follow-up with OB as scheduled for prenatal care -Patient may return to MAU as needed or if her condition were to change or worsen   Rolm Bookbinder CNM 08/04/2020, 6:49 AM

## 2020-08-05 LAB — GC/CHLAMYDIA PROBE AMP (~~LOC~~) NOT AT ARMC
Chlamydia: NEGATIVE
Comment: NEGATIVE
Comment: NORMAL
Neisseria Gonorrhea: NEGATIVE

## 2020-08-14 ENCOUNTER — Encounter: Payer: Self-pay | Admitting: Obstetrics & Gynecology

## 2020-08-14 ENCOUNTER — Ambulatory Visit (INDEPENDENT_AMBULATORY_CARE_PROVIDER_SITE_OTHER): Payer: Medicaid Other | Admitting: Obstetrics & Gynecology

## 2020-08-14 ENCOUNTER — Other Ambulatory Visit: Payer: Self-pay

## 2020-08-14 VITALS — BP 120/79 | HR 87 | Wt 202.9 lb

## 2020-08-14 DIAGNOSIS — O09522 Supervision of elderly multigravida, second trimester: Secondary | ICD-10-CM

## 2020-08-14 DIAGNOSIS — Z3A15 15 weeks gestation of pregnancy: Secondary | ICD-10-CM

## 2020-08-14 DIAGNOSIS — O0990 Supervision of high risk pregnancy, unspecified, unspecified trimester: Secondary | ICD-10-CM

## 2020-08-14 DIAGNOSIS — O09892 Supervision of other high risk pregnancies, second trimester: Secondary | ICD-10-CM | POA: Diagnosis not present

## 2020-08-14 DIAGNOSIS — O09899 Supervision of other high risk pregnancies, unspecified trimester: Secondary | ICD-10-CM

## 2020-08-14 DIAGNOSIS — Z8759 Personal history of other complications of pregnancy, childbirth and the puerperium: Secondary | ICD-10-CM

## 2020-08-14 DIAGNOSIS — O09529 Supervision of elderly multigravida, unspecified trimester: Secondary | ICD-10-CM

## 2020-08-14 DIAGNOSIS — O0992 Supervision of high risk pregnancy, unspecified, second trimester: Secondary | ICD-10-CM

## 2020-08-14 MED ORDER — ASPIRIN EC 81 MG PO TBEC: 81.0000 mg | DELAYED_RELEASE_TABLET | Freq: Every day | ORAL | 2 refills | Status: DC

## 2020-08-14 NOTE — Patient Instructions (Signed)

## 2020-08-14 NOTE — Progress Notes (Signed)
   PRENATAL VISIT NOTE  Subjective:  Tanya Bailey is a 35 y.o. [phone] at [redacted]w[redacted]d being seen today for ongoing prenatal care.  She is currently monitored for the following issues for this high-risk pregnancy and has History of preterm delivery, currently pregnant; Supervision of high risk pregnancy, antepartum; Unwanted fertility; Antepartum multigravida of advanced maternal age; and History of gestational hypertension on their problem list.  Patient reports no complaints.  Contractions: Not present. Vag. Bleeding: None.  Movement: Present. Denies leaking of fluid.   The following portions of the patient's history were reviewed and updated as appropriate: allergies, current medications, past family history, past medical history, past social history, past surgical history and problem list.   Objective:   Vitals:   08/14/20 1316  BP: 120/79  Pulse: 87  Weight: 202 lb 14.4 oz (92 kg)    Fetal Status: Fetal Heart Rate (bpm): 145   Movement: Present     General:  Alert, oriented and cooperative. Patient is in no acute distress.  Skin: Skin is warm and dry. No rash noted.   Cardiovascular: Normal heart rate noted  Respiratory: Normal respiratory effort, no problems with respiration noted  Abdomen: Soft, gravid, appropriate for gestational age.  Pain/Pressure: Absent     Pelvic: Cervical exam deferred        Extremities: Normal range of motion.  Edema: None  Mental Status: Normal mood and affect. Normal behavior. Normal judgment and thought content.   Assessment and Plan:  Pregnancy: [id number] at [redacted]w[redacted]d 1. History of preterm delivery, currently pregnant Will get started on Makena soon, patient needs to call rep back to get medication. - [place] MFM OB DETAIL +14 WK; Future  2. History of gestational hypertension ASA recommended for GHTN/preeclampsia prophylaxis; this was ordered - aspirin EC 81 MG tablet; Take 1 tablet (81 mg total) by mouth daily. Take after 12 weeks for prevention of  preeclampsia later in pregnancy  Dispense: 300 tablet; Refill: 2  3. Antepartum multigravida of advanced maternal age 23. [redacted] weeks gestation of pregnancy 5. Supervision of high risk pregnancy, antepartum AFP to be drawn today. Will be scheduled for anatomy scan. - AFP, Serum, Open Spina Bifida - [place] MFM OB DETAIL +14 WK; Future No other complaints or concerns.  Routine obstetric precautions reviewed.  Please refer to After Visit Summary for other counseling recommendations.   Return in about 4 weeks (around 09/11/2020) for OFFICE OB VISIT (MD only).  No future appointments.  Jaynie Collins, MD

## 2020-08-15 ENCOUNTER — Encounter (HOSPITAL_COMMUNITY): Payer: Self-pay | Admitting: Obstetrics and Gynecology

## 2020-08-15 ENCOUNTER — Inpatient Hospital Stay (HOSPITAL_BASED_OUTPATIENT_CLINIC_OR_DEPARTMENT_OTHER): Payer: Medicaid Other

## 2020-08-15 ENCOUNTER — Inpatient Hospital Stay (HOSPITAL_COMMUNITY)
Admission: AD | Admit: 2020-08-15 | Discharge: 2020-08-15 | Disposition: A | Discharge status: Home / Self Care | Payer: Medicaid Other | Attending: Obstetrics and Gynecology | Admitting: Obstetrics and Gynecology

## 2020-08-15 DIAGNOSIS — R102 Pelvic and perineal pain: Secondary | ICD-10-CM | POA: Diagnosis not present

## 2020-08-15 DIAGNOSIS — O99891 Other specified diseases and conditions complicating pregnancy: Secondary | ICD-10-CM | POA: Diagnosis not present

## 2020-08-15 DIAGNOSIS — Z7982 Long term (current) use of aspirin: Secondary | ICD-10-CM | POA: Insufficient documentation

## 2020-08-15 DIAGNOSIS — O09212 Supervision of pregnancy with history of pre-term labor, second trimester: Secondary | ICD-10-CM | POA: Diagnosis not present

## 2020-08-15 DIAGNOSIS — O26892 Other specified pregnancy related conditions, second trimester: Secondary | ICD-10-CM

## 2020-08-15 DIAGNOSIS — Z3A15 15 weeks gestation of pregnancy: Secondary | ICD-10-CM

## 2020-08-15 DIAGNOSIS — O4692 Antepartum hemorrhage, unspecified, second trimester: Secondary | ICD-10-CM | POA: Diagnosis not present

## 2020-08-15 DIAGNOSIS — Z3686 Encounter for antenatal screening for cervical length: Secondary | ICD-10-CM

## 2020-08-15 DIAGNOSIS — R109 Unspecified abdominal pain: Secondary | ICD-10-CM | POA: Insufficient documentation

## 2020-08-15 DIAGNOSIS — O0990 Supervision of high risk pregnancy, unspecified, unspecified trimester: Secondary | ICD-10-CM

## 2020-08-15 LAB — WET PREP, GENITAL
Clue Cells Wet Prep HPF POC: NONE SEEN
Sperm: NONE SEEN
Trich, Wet Prep: NONE SEEN
Yeast Wet Prep HPF POC: NONE SEEN

## 2020-08-15 LAB — URINALYSIS, ROUTINE W REFLEX MICROSCOPIC
Bilirubin Urine: NEGATIVE
Glucose, UA: NEGATIVE mg/dL
Hgb urine dipstick: NEGATIVE
Ketones, ur: NEGATIVE mg/dL
Leukocytes,Ua: NEGATIVE
Nitrite: NEGATIVE
Protein, ur: NEGATIVE mg/dL
Specific Gravity, Urine: 1.029 (ref 1.005–1.030)
pH: 6 (ref 5.0–8.0)

## 2020-08-15 MED ORDER — ACETAMINOPHEN 325 MG PO TABS
650.0000 mg | ORAL_TABLET | Freq: Once | ORAL | Status: AC
  Administered 2020-08-15: 650 mg via ORAL
  Filled 2020-08-15: qty 2

## 2020-08-15 NOTE — MAU Note (Signed)
Tanya Bailey is a 35 y.o. at [redacted]w[redacted]d here in MAU reporting: woke up this AM with some nausea and now she is cramping. Then she felt something in her panties and she was "gushing blood" and it was mucus. Cramping has been getting worse.  Onset of complaint: today  Pain score: 2/10  Vitals:   08/15/20 1219  BP: 116/63  Pulse: 76  Resp: 16  Temp: 98.2 F (36.8 C)  SpO2: 100%     FHT:150  Lab orders placed from triage: UA

## 2020-08-15 NOTE — Discharge Instructions (Signed)

## 2020-08-15 NOTE — MAU Provider Note (Signed)
Patient Tanya Bailey is a 35 y.o.  [phone]  at [redacted]w[redacted]d here with complaints with abdominal pain and vaginal cramping that started this morning at 5 am. She denies nausea, vomiting, fever, SOB.   She has a pregnancy complicated by preterm deliveries in the past. She is scheduled for 17P injections at 16 weeks.  History     CSN: [id number]  Arrival date and time: 08/15/20 1205   Event Date/Time   First Provider Initiated Contact with Patient 08/15/20 1244      Chief Complaint  Patient presents with  . Abdominal Pain  . Vaginal Bleeding   Abdominal Pain This is a new problem. The current episode started today. The problem occurs intermittently. The pain is located in the suprapubic region. The pain is at a severity of 2/10. The quality of the pain is cramping. Weight loss: vaginal bleeding.  Vaginal Bleeding The patient's primary symptoms include vaginal bleeding. This is a new problem. The current episode started today. The problem occurs intermittently. The problem has been resolved. Associated symptoms include abdominal pain. The vaginal discharge was bloody and mucoid. The vaginal bleeding is spotting. She has not been passing clots. She has not been passing tissue.    OB History    Gravida  8   Para  6   Term  2   Preterm  4   AB  1   Living  5     SAB  0   IAB  0   Ectopic  1   Multiple  0   Live Births  6           Past Medical History:  Diagnosis Date  . Abnormal Pap smear   . Anemia   . Chlamydia   . Ectopic pregnancy   . Heart murmur    childhood  . PID (acute pelvic inflammatory disease)    with Paraguard  . Positive GBS test 01/19/2017  . Pregnancy induced hypertension    end of last preg  . Preterm labor   . Vaginal Pap smear, abnormal     Past Surgical History:  Procedure Laterality Date  . TOOTH EXTRACTION      Family History  Problem Relation Age of Onset  . Cancer Maternal Grandfather   . Prostate cancer Paternal  Grandfather   . Cancer Paternal Grandfather   . Anesthesia problems Neg Hx   . Alcohol abuse Neg Hx   . Arthritis Neg Hx   . Asthma Neg Hx   . Birth defects Neg Hx   . COPD Neg Hx   . Depression Neg Hx   . Diabetes Neg Hx   . Drug abuse Neg Hx   . Early death Neg Hx   . Hearing loss Neg Hx   . Heart disease Neg Hx   . Hyperlipidemia Neg Hx   . Hypertension Neg Hx   . Kidney disease Neg Hx   . Learning disabilities Neg Hx   . Mental illness Neg Hx   . Mental retardation Neg Hx   . Miscarriages / Stillbirths Neg Hx   . Stroke Neg Hx   . Vision loss Neg Hx   . Other Neg Hx     Social History   Tobacco Use  . Smoking status: Never Smoker  . Smokeless tobacco: Never Used  Vaping Use  . Vaping Use: Never used  Substance Use Topics  . Alcohol use: No    Comment: Special occas. when not pregnant  .  Drug use: No    Allergies:  Allergies  Allergen Reactions  . Latex     Medications Prior to Admission  Medication Sig Dispense Refill Last Dose  . aspirin EC 81 MG tablet Take 1 tablet (81 mg total) by mouth daily. Take after 12 weeks for prevention of preeclampsia later in pregnancy 300 tablet 2   . Prenatal Vit-Fe Fumarate-FA (MULTIVITAMIN-PRENATAL) 27-0.8 MG TABS tablet Take 1 tablet by mouth daily at 12 noon.       Review of Systems  Constitutional: Weight loss: vaginal bleeding.  HENT: Negative.   Respiratory: Negative.   Cardiovascular: Negative.   Gastrointestinal: Positive for abdominal pain.  Genitourinary: Positive for vaginal bleeding.  Neurological: Negative.   Hematological: Negative.   Psychiatric/Behavioral: Negative.    Physical Exam   Blood pressure 116/63, pulse 76, temperature 98.2 F (36.8 C), temperature source Oral, resp. rate 16, height 5\' 7"  (1.702 m), weight 93.5 kg, last menstrual period 04/30/2020, SpO2 100 %.  Physical Exam Constitutional:      Appearance: She is well-developed.  HENT:     Head: Normocephalic.  Abdominal:      General: Bowel sounds are normal.     Palpations: Abdomen is soft.     Tenderness: There is abdominal tenderness in the right lower quadrant.  Genitourinary:    Vagina: Normal.     Cervix: Normal.     Uterus: Normal.   Skin:    General: Skin is warm.  Neurological:     Mental Status: She is alert.     MAU Course  Procedures  MDM -06/28/2020 shows no signs of abruption or SCH; US shows no signs of funneling of cervix. Overall reassuring US -Fetal cardiac activity noted -wet prep negative -will give tylenol for pain> pain is now 0/10 -UA negative, no signs of infection Assessment and Plan   1. Pelvic pain during pregnancy in second trimester, antepartum   2. Supervision of high risk pregnancy, antepartum   3. Encounter for antenatal screening for cervical length   -Patient stable for discharge; keep follow up appt -GC CT pending -Return to MAU if bleeding returns or other concerning symptom [name] 08/15/2020, 1:11 PM

## 2020-08-16 LAB — AFP, SERUM, OPEN SPINA BIFIDA
AFP MoM: 0.67
AFP Value: 18.9 ng/mL
Gest. Age on Collection Date: 15.1 weeks
Maternal Age At EDD: 35.7 yr
OSBR Risk 1 IN: 10000
Test Results:: NEGATIVE
Weight: 202 [lb_av]

## 2020-08-16 LAB — GC/CHLAMYDIA PROBE AMP (~~LOC~~) NOT AT ARMC
Chlamydia: NEGATIVE
Comment: NEGATIVE
Comment: NORMAL
Neisseria Gonorrhea: NEGATIVE

## 2020-09-10 ENCOUNTER — Other Ambulatory Visit: Payer: Self-pay | Admitting: Obstetrics & Gynecology

## 2020-09-10 ENCOUNTER — Ambulatory Visit: Payer: Medicaid Other

## 2020-09-10 ENCOUNTER — Ambulatory Visit (HOSPITAL_BASED_OUTPATIENT_CLINIC_OR_DEPARTMENT_OTHER): Payer: Medicaid Other

## 2020-09-10 ENCOUNTER — Ambulatory Visit: Payer: Medicaid Other | Attending: Obstetrics & Gynecology | Admitting: *Deleted

## 2020-09-10 ENCOUNTER — Encounter: Payer: Self-pay | Admitting: *Deleted

## 2020-09-10 ENCOUNTER — Other Ambulatory Visit: Payer: Self-pay

## 2020-09-10 VITALS — BP 105/64 | HR 70

## 2020-09-10 DIAGNOSIS — E669 Obesity, unspecified: Secondary | ICD-10-CM

## 2020-09-10 DIAGNOSIS — O0990 Supervision of high risk pregnancy, unspecified, unspecified trimester: Secondary | ICD-10-CM

## 2020-09-10 DIAGNOSIS — Z148 Genetic carrier of other disease: Secondary | ICD-10-CM

## 2020-09-10 DIAGNOSIS — O09529 Supervision of elderly multigravida, unspecified trimester: Secondary | ICD-10-CM

## 2020-09-10 DIAGNOSIS — O99212 Obesity complicating pregnancy, second trimester: Secondary | ICD-10-CM | POA: Diagnosis not present

## 2020-09-10 DIAGNOSIS — O0942 Supervision of pregnancy with grand multiparity, second trimester: Secondary | ICD-10-CM

## 2020-09-10 DIAGNOSIS — Z3A19 19 weeks gestation of pregnancy: Secondary | ICD-10-CM | POA: Diagnosis not present

## 2020-09-10 DIAGNOSIS — O358XX0 Maternal care for other (suspected) fetal abnormality and damage, not applicable or unspecified: Secondary | ICD-10-CM

## 2020-09-10 DIAGNOSIS — O0992 Supervision of high risk pregnancy, unspecified, second trimester: Secondary | ICD-10-CM | POA: Insufficient documentation

## 2020-09-10 DIAGNOSIS — O09292 Supervision of pregnancy with other poor reproductive or obstetric history, second trimester: Secondary | ICD-10-CM

## 2020-09-10 DIAGNOSIS — O09522 Supervision of elderly multigravida, second trimester: Secondary | ICD-10-CM | POA: Insufficient documentation

## 2020-09-10 DIAGNOSIS — O09899 Supervision of other high risk pregnancies, unspecified trimester: Secondary | ICD-10-CM

## 2020-09-10 DIAGNOSIS — Z363 Encounter for antenatal screening for malformations: Secondary | ICD-10-CM

## 2020-09-10 DIAGNOSIS — O322XX0 Maternal care for transverse and oblique lie, not applicable or unspecified: Secondary | ICD-10-CM

## 2020-09-10 NOTE — Progress Notes (Signed)
MFM Note  Tanya Bailey was seen for a detailed fetal anatomy scan due to advanced maternal age and grand multiparity.  She reports 4 prior preterm births, one at around 26 weeks and the rest that between 34 to 37 weeks.  She reports 6 prior vaginal deliveries.  She denies any significant past medical history and denies any problems in her current pregnancy.  This pregnancy was conceived with a new partner.  She had a cell free DNA test earlier in her pregnancy which indicated a low risk for trisomy 81, 34, and 13. A female fetus is predicted.   The overall EFW obtained today measured at the 6th percentile for her gestational age, indicating fetal growth restriction.  There was normal amniotic fluid noted.  Doppler studies of the umbilical arteries performed today continues to show normal forward flow.  There are no signs of absent or reversed end-diastolic flow.  Due to her history of multiple prior preterm births, a transvaginal ultrasound performed today showed a cervical length of 4.13 cm long without any signs of funneling.  The patient was reassured that her risk of a preterm birth is low based on the normal cervical length obtained today. On today's exam, a radial ray abnormality was noted in the right arm/hand.  The fetus was missing a bone in the right forearm and the right hand was curved/bent.  The left arm and hand appeared within normal limits.    A possible spine abnormality was also noted.  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  The patient denies any family history of osteogenesis imperfecta.  The fetal cardiac axis was also abnormal (14 degrees).  The patient was advised that the normal fetal cardiac axis is usually about 45 degrees.  Although an obvious cardiac defect was not noted today, the abnormal cardiac axis increases the the risk of a congenital heart defect. The patient was informed that anomalies may be missed due to technical limitations. If the fetus is  in a suboptimal position or maternal habitus is increased, visualization of the fetus in the maternal uterus may be impaired.  The patient was advised regarding the increased risk of a genetic syndrome based on all the abnormalities noted on today's exam.    She was advised regarding the availability of an amniocentesis for definitive diagnosis of fetal aneuploidy and for diagnosis of chromosomal abnormalities that may contribute to today's ultrasound findings.  After all risks versus benefits of the amniocentesis procedure were discussed, the patient agreed to undergo a genetic amniocentesis today.  After informed consent was obtained, a timeout was performed verifying the patient's identity and the indications for the procedure.  The patient was then prepped and draped in the usual sterile fashion.  An uncomplicated genetic amniocentesis was performed today obtaining 30 cc of straw-colored amniotic fluid which was then sent off for amniotic fluid AFP, FISH, MicroArray analysis, and chromosome analysis.  The patient was advised that our genetic counselor will notify her regarding the results of the amniocentesis.  Post amniocentesis instructions were discussed.  As the patient's blood type is Rh positive, a dose of RhoGam was not given following the procedure.    After being advised regarding the multiple abnormalities noted on today's exam, the patient stated that she would like to terminate her pregnancy as soon as possible as she would not want to give birth to a child with all of these abnormalities.  We will help the patient make arrangements to schedule a Cytotec induction as  soon as possible.  She signed the written certification of termination of pregnancy form today.  She will consider an autopsy after delivery.  The patient stated that all of her questions have been answered to her complete satisfaction.  A total of 60 minutes was spent counseling and coordinating the care for this patient.   Greater than 50% of the time was spent in direct face-to-face contact.

## 2020-09-10 NOTE — Progress Notes (Signed)
Tanya Bailey were seen in follow up to her ultrasound today with Dr. Parke Poisson.  The ultrasound at [redacted] weeks gestation revealed abnormal rotation of the fetal heart, radial ray abnormality of right arm and anomalies of the spine. See ultrasound report for details.  Given the anomalies, Dr. Parke Poisson spoke with the patient about amniocentesis as well as pregnancy management options.    I spoke with her briefly regarding the option of amniocentesis to assess for FISH for aneuploidy, chromosome abnormalities as well as a chromosomal microarray.  She would like to have termination of pregnancy at this time regardless of the genetic results, but would still like the testing for additional information.  We also discussed the option of induction of labor vs. surgical termination of pregnancy. She would prefer induction.  We emailed her providers and they will contact her to make arrangements.  We reviewed the 72 hour consent, which [name] signed this afternoon.  We would also recommend a physical examination, fetal xrays upon delivery and an autopsy if desired and available.  We will follow up with the patient for additional genetic counseling as appropriate once the test results become available.  She reported no exposures in this pregnancy and no family history of birth defects or known genetic conditions.  We can be reached at [phone] or [phone].  Cherly Anderson, MS, CGC

## 2020-09-10 NOTE — Addendum Note (Signed)
Addended by: Katrina Stack F on: 09/10/2020 04:14 PM   Modules accepted: Orders

## 2020-09-11 ENCOUNTER — Encounter: Payer: Medicaid Other | Admitting: Obstetrics and Gynecology

## 2020-09-11 ENCOUNTER — Other Ambulatory Visit: Payer: Self-pay | Admitting: Advanced Practice Midwife

## 2020-09-11 ENCOUNTER — Other Ambulatory Visit: Payer: Self-pay | Admitting: Obstetrics & Gynecology

## 2020-09-11 ENCOUNTER — Telehealth: Payer: Self-pay | Admitting: Obstetrics & Gynecology

## 2020-09-11 DIAGNOSIS — O359XX0 Maternal care for (suspected) fetal abnormality and damage, unspecified, not applicable or unspecified: Secondary | ICD-10-CM | POA: Insufficient documentation

## 2020-09-11 NOTE — Telephone Encounter (Signed)
     Faculty Practice OB/GYN Physician Phone Call Documentation  I called Lucie Leather about upcoming admission, there was an immediate generic prompt to leave a voicemail. Tried three times, same result. Voicemail left asking her to call [facility] [phone] to give more information about this admission, this will help determine which unit she will be admitted to.  Also sent her a MyChart message.  Of note, given that she is less than [redacted] weeks gestation, if patient is getting misoprostol induction as planned but wants an epidural, she will need to be admitted to L&D.   If she needs pitocin augmentation, she will also need to be on L&D.  If no need for epidural or pitocin, she can be admitted to [facility].  Once we get this information, we will schedule induction appropriately.  72 hour papers signed around 1600 on 09/10/20, will schedule for 09/13/20 after 1600 or 09/14/20.    Jaynie Collins, MD, FACOG Obstetrician & Gynecologist, [facility] for [employer], [facility]

## 2020-09-11 NOTE — Progress Notes (Signed)
     Faculty Practice OB/GYN Physician Phone Call Documentation  I had a phone conversation with Tanya Bailey about her upcoming admission for IOL due to multiple fetal anomalies at [redacted] weeks gestation.  Patient reports high likelihood of needing epidural due to pain.  Discussed details of misoprostol induction and expectations, all questions answered.  Talked to L&D RN in charge, this was formally scheduled for midnight on 09/14/20. Patient advised to come to [facility] at 11:45 pm on 09/13/20 for admission. Also emphasized need for pre-admission COVID testing, she will be contacted with details about this. All questions answered. Orders signed and held.    Jaynie Collins, MD, FACOG Obstetrician & Gynecologist, [facility] for [employer], [facility]

## 2020-09-12 ENCOUNTER — Telehealth: Payer: Self-pay | Admitting: Obstetrics and Gynecology

## 2020-09-12 ENCOUNTER — Other Ambulatory Visit: Payer: Self-pay | Admitting: *Deleted

## 2020-09-12 DIAGNOSIS — O359XX0 Maternal care for (suspected) fetal abnormality and damage, unspecified, not applicable or unspecified: Secondary | ICD-10-CM

## 2020-09-12 DIAGNOSIS — O358XX0 Maternal care for other (suspected) fetal abnormality and damage, not applicable or unspecified: Secondary | ICD-10-CM

## 2020-09-12 LAB — SPECIMEN STATUS REPORT

## 2020-09-12 NOTE — Telephone Encounter (Signed)
Left message for patient after calling [facility] to reach her regarding part of the results from her amniocentesis. Her phone goes directly to voicemail and does not ring. The results from [facility] and [facility] are available.  We can be reached at [phone].  Cherly Anderson, MS, CGC

## 2020-09-12 NOTE — Telephone Encounter (Signed)
I spoke with Tanya Bailey late this afternoon in detail regarding a portion of her lab results as well as her plans for the pregnancy.  We first reviewed the normal results from the FISH (normal for chromosomes 13, 18, 21, X and Y) and normal amniotic fluid AFP level.  Chromosome analysis and microarray are still pending.  She informed me that she is planning to see Dr. Parke Poisson for another ultrasound tomorrow afternoon at 3:30, as she feels like she needs additional information before making a decision for sure about the termination which is scheduled for this weekend.  She stated that this is a wanted pregnancy and she would only terminate for medical reasons that would significantly impact the life of the child.  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  We went back over the ultrasound findings from Tuesday (limb abnormality, abnormal rotation of the heart, and hypomineralization of the spine) and the fact that radial ray defects can occur as an isolated finding (with a good prognosis) or along with other medical or developmental concerns as part of numerous conditions.  We reviewed the possibility of numerical or structural chromosome conditions, microdeletions/duplications of the genome, single gene disorders and sporadic events.  She would like a fetal echocardiogram to be scheduled as soon as possible given the concern about the heart rotation. We will work on this tomorrow, as I spoke with her after 5 today.  Results from the chromosome analysis are not expected to be available until late next week and a week or so beyond that for the microarray results. Testing for some single gene disorders can also be considered if all other tests are normal, though the cost of these tests can be high and very time consuming. We did review the fact that termination options  are later in other states, so that if she desires to wait on additional results to make a final decision, then options will still be available, albeit with travel required.    We also inquired about the family history, which was said to be unremarkable except for possible congenital hip dislocation in the daughter of her partner.  I explained this is likely not related to the current findings.    Lastly, I encouraged her to bring the father of the pregnancy with her tomorrow.  He had asked her questions about the cause for the findings and she expressed that she would like him to also have as much information as possible.    We should know more about her desires after the visit with Dr. Parke Poisson tomorrow and will follow up with her OB at that time.    The patient has my contact information and is free to reach out with questions.  Cherly Anderson, MS, CGC

## 2020-09-13 ENCOUNTER — Other Ambulatory Visit: Payer: Self-pay

## 2020-09-13 ENCOUNTER — Ambulatory Visit: Payer: Medicaid Other | Admitting: *Deleted

## 2020-09-13 ENCOUNTER — Ambulatory Visit: Payer: Medicaid Other

## 2020-09-13 ENCOUNTER — Ambulatory Visit: Payer: Medicaid Other | Attending: Obstetrics

## 2020-09-13 ENCOUNTER — Encounter: Payer: Self-pay | Admitting: *Deleted

## 2020-09-13 ENCOUNTER — Encounter: Payer: Self-pay | Admitting: Pediatric Cardiology

## 2020-09-13 DIAGNOSIS — Z148 Genetic carrier of other disease: Secondary | ICD-10-CM

## 2020-09-13 DIAGNOSIS — O0990 Supervision of high risk pregnancy, unspecified, unspecified trimester: Secondary | ICD-10-CM | POA: Diagnosis present

## 2020-09-13 DIAGNOSIS — O358XX0 Maternal care for other (suspected) fetal abnormality and damage, not applicable or unspecified: Secondary | ICD-10-CM | POA: Insufficient documentation

## 2020-09-13 DIAGNOSIS — O99212 Obesity complicating pregnancy, second trimester: Secondary | ICD-10-CM

## 2020-09-13 DIAGNOSIS — O09212 Supervision of pregnancy with history of pre-term labor, second trimester: Secondary | ICD-10-CM | POA: Diagnosis not present

## 2020-09-13 DIAGNOSIS — O09522 Supervision of elderly multigravida, second trimester: Secondary | ICD-10-CM

## 2020-09-13 DIAGNOSIS — O09529 Supervision of elderly multigravida, unspecified trimester: Secondary | ICD-10-CM | POA: Diagnosis not present

## 2020-09-13 DIAGNOSIS — O0942 Supervision of pregnancy with grand multiparity, second trimester: Secondary | ICD-10-CM | POA: Diagnosis not present

## 2020-09-13 DIAGNOSIS — Z3A19 19 weeks gestation of pregnancy: Secondary | ICD-10-CM

## 2020-09-13 DIAGNOSIS — O359XX0 Maternal care for (suspected) fetal abnormality and damage, unspecified, not applicable or unspecified: Secondary | ICD-10-CM

## 2020-09-13 DIAGNOSIS — E669 Obesity, unspecified: Secondary | ICD-10-CM

## 2020-09-13 NOTE — Progress Notes (Unsigned)
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  Her amniocentesis results so far has shown a normal FISH and normal amniotic fluid alpha-fetoprotein.  The chromosomes and MicroArray analysis are currently pending.  The patient was initially scheduled for a Cytotec induction for termination of pregnancy later this evening.  However, she had second thoughts regarding the termination of pregnancy after doing more research regarding the abnormalities that have been noted on her ultrasound exam.  The termination procedure has been canceled for now.  The patient had a fetal echocardiogram performed with [facility] earlier this afternoon that did not show any major cardiac defects other than the abnormal fetal cardiac axis.  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  The right hand is curved inwards.  She was given pictures that we found on the Internet of what the hand would look like for people who are born with the radial ray abnormality.    The fetal cardiac axis continues to be abnormal.  There were multiple thoracic vertebral abnormalities noted today.  The positions of the ribs may be altered due to the spinal abnormality, potentially causing the cardiac axis to be abnormal.  She was advised that the spinal abnormalities may ultimately cause a form of scoliosis or some abnormal curvature of the spine.  The final outcome/prognosis due to the spine abnormalities may not be determined until after birth.  She was reassured that the people born with the abnormalities noted today may potentially live a normal life with proper management and therapy.    The patient is confident regarding her decision to  continue on with the pregnancy.  She was advised that we will await the final results from her amniocentesis.  She understands that she may still have the option of termination up until about 22 to 24 weeks depending on the state that the procedure is performed in.  We will make a referral for a prenatal consultation with pediatric orthopedic surgery at [facility] to discuss the management and treatment of children who are born with the radial ray abnormality.  A follow-up exam was scheduled in our office in 3 weeks.

## 2020-09-14 ENCOUNTER — Inpatient Hospital Stay (HOSPITAL_COMMUNITY): Payer: Medicaid Other

## 2020-09-14 ENCOUNTER — Inpatient Hospital Stay (HOSPITAL_COMMUNITY)
Admission: AD | Admit: 2020-09-14 | Payer: Medicaid Other | Source: Home / Self Care | Admitting: Obstetrics and Gynecology

## 2020-09-16 ENCOUNTER — Other Ambulatory Visit: Payer: Self-pay | Admitting: *Deleted

## 2020-09-16 DIAGNOSIS — O359XX0 Maternal care for (suspected) fetal abnormality and damage, unspecified, not applicable or unspecified: Secondary | ICD-10-CM

## 2020-09-16 DIAGNOSIS — O358XX0 Maternal care for other (suspected) fetal abnormality and damage, not applicable or unspecified: Secondary | ICD-10-CM

## 2020-09-17 ENCOUNTER — Telehealth: Payer: Self-pay | Admitting: Obstetrics and Gynecology

## 2020-09-17 NOTE — Telephone Encounter (Signed)
Ms. Bonham was able to be seen for a fetal echocardiogram with Dr. Casilda Carls with [facility] this afternoon.  I spoke with the patient after that visit and she was very reassured that while the axis of the heart was rotated, the structures of the fetal heart appear normal.  She was also scheduled for a follow up ultrasound with Dr. Parke Poisson at [facility].  Given the results of the echocardiogram and further reflection by the patient, she decided to cancel the induction of labor scheduled for this evening on Labor and Delivery.  She will wait to make a decision about the pregnancy once additional genetic test results become available.  I notified the OB team as well as L&D to cancel the visit.  Cherly Anderson, MS, CGC

## 2020-09-20 LAB — MCC TRACKING

## 2020-09-24 ENCOUNTER — Telehealth: Payer: Self-pay | Admitting: Obstetrics and Gynecology

## 2020-09-24 NOTE — Telephone Encounter (Signed)
I spoke with Tanya Bailey today to provide the results of the chromosome analysis from her recent amniocentesis. The results showed normal, 46,XY karyotype indicating no numerical aneuploidies or structural chromosome rearrangements.  Chromosomal microarray is still pending, which can look for smaller regions with deletions or duplications of genetic material. I will reach out once those results become available.  If the microarray is normal, then this family can consider participation in a research study through [facility] which includes whole genome sequencing to assess for single gene disorders.  We may be reached at [phone].  Cherly Anderson, MS, CGC

## 2020-09-25 LAB — FISH, RFX CHROM. OR REVEAL SNP MICROARRAY (AMNIOTIC FLD)
Cells Analyzed: 50
Cells Counted: 50

## 2020-09-25 LAB — CHROMOSOME, AMNIOTIC FLUID
Cells Analyzed: 15
Cells Counted: 15
Cells Karyotyped: 2
Colonies: 15
GTG Band Resolution Achieved: 450

## 2020-09-25 LAB — AFP, AMNIOTIC FLUID
AFP, Amniotic Fluid (mcg/ml): 8.7 ug/mL
Gestational Age(Wks): 19
MOM, Amniotic Fluid: 1.14

## 2020-09-25 LAB — CHROMOSOME MICROARRAY REFLEX, AMN FLD

## 2020-09-25 LAB — MATERNAL CELL CONTAMINATION

## 2020-09-26 ENCOUNTER — Telehealth: Payer: Self-pay | Admitting: Obstetrics and Gynecology

## 2020-09-26 NOTE — Telephone Encounter (Signed)
I called Ms. Aydelotte to discuss her negative chromosomal microarray results from amniocentesis. Microarray analysis revealed a normal female complement with no significant DNA copy number changes or copy neutral regions and no maternal cell contamination. This significantly reduces the possibility of a chromosomal microdeletion or microduplication syndrome in this fetus. While karyotype and chromosomal microarray cannot rule out all genetic syndromes, normal karyotype and chromosomal microarray analyses for the current fetus rule out clinically significant chromosomal abnormalities.   Ms. Masri and I reviewed again that single gene conditions or associations such as VACTERL cannot always be diagnosed prenatally.  There are single gene testing panels which can be ordered to assess for some genetic causes for limb anomalies.  The WES study at [facility] is also available for this patient.  This is a whole exome sequencing study that may provide information on single gene conditions throughout the genome, which would be more complete than the panel testing options and can be performed at no charge to the patient.  The patient would like to speak with the genetic counselor, Glenis Smoker, at [facility] about this option.  The current plan for Ms. Broadnax is as follows:  Referral has been made to [facility] for a prenatal consultation with Dr. Nedra Hai or Dr. Charm Barges.  [facility] WES study to contact the patient regarding study consent. They will also contact [facility] to obtain a sample for DNA extraction.  The patient is aware that study participation is dependent upon successful extraction of DNA.  Follow up ultrasound at [facility] in [place] on 10/04/20.   The patient has been encouraged to reach out with any questions.  We may be reached at [phone].   Katrina Stack, MS, [facility] Genetic Counselor

## 2020-10-02 ENCOUNTER — Telehealth: Payer: Self-pay

## 2020-10-02 NOTE — Telephone Encounter (Signed)
FETAL ECHO COMPLETED 09/13/2020.

## 2020-10-04 ENCOUNTER — Encounter: Payer: Self-pay | Admitting: *Deleted

## 2020-10-04 ENCOUNTER — Other Ambulatory Visit: Payer: Self-pay | Admitting: *Deleted

## 2020-10-04 ENCOUNTER — Ambulatory Visit: Payer: Medicaid Other | Admitting: *Deleted

## 2020-10-04 ENCOUNTER — Other Ambulatory Visit: Payer: Self-pay

## 2020-10-04 ENCOUNTER — Ambulatory Visit: Payer: Medicaid Other | Attending: Obstetrics

## 2020-10-04 ENCOUNTER — Other Ambulatory Visit: Payer: Self-pay | Admitting: Obstetrics

## 2020-10-04 VITALS — BP 125/75 | HR 69

## 2020-10-04 DIAGNOSIS — O359XX0 Maternal care for (suspected) fetal abnormality and damage, unspecified, not applicable or unspecified: Secondary | ICD-10-CM

## 2020-10-04 DIAGNOSIS — O0990 Supervision of high risk pregnancy, unspecified, unspecified trimester: Secondary | ICD-10-CM

## 2020-10-04 DIAGNOSIS — O09212 Supervision of pregnancy with history of pre-term labor, second trimester: Secondary | ICD-10-CM

## 2020-10-04 DIAGNOSIS — O99212 Obesity complicating pregnancy, second trimester: Secondary | ICD-10-CM

## 2020-10-04 DIAGNOSIS — O358XX0 Maternal care for other (suspected) fetal abnormality and damage, not applicable or unspecified: Secondary | ICD-10-CM | POA: Insufficient documentation

## 2020-10-04 DIAGNOSIS — O365920 Maternal care for other known or suspected poor fetal growth, second trimester, not applicable or unspecified: Secondary | ICD-10-CM

## 2020-10-04 DIAGNOSIS — E669 Obesity, unspecified: Secondary | ICD-10-CM

## 2020-10-04 DIAGNOSIS — O09522 Supervision of elderly multigravida, second trimester: Secondary | ICD-10-CM | POA: Diagnosis not present

## 2020-10-04 DIAGNOSIS — Z3A22 22 weeks gestation of pregnancy: Secondary | ICD-10-CM

## 2020-10-04 DIAGNOSIS — Z148 Genetic carrier of other disease: Secondary | ICD-10-CM

## 2020-10-04 DIAGNOSIS — Z362 Encounter for other antenatal screening follow-up: Secondary | ICD-10-CM | POA: Diagnosis not present

## 2020-10-04 DIAGNOSIS — O0942 Supervision of pregnancy with grand multiparity, second trimester: Secondary | ICD-10-CM

## 2020-10-08 ENCOUNTER — Other Ambulatory Visit: Payer: Self-pay

## 2020-10-08 ENCOUNTER — Ambulatory Visit (INDEPENDENT_AMBULATORY_CARE_PROVIDER_SITE_OTHER): Payer: Medicaid Other | Admitting: Obstetrics & Gynecology

## 2020-10-08 VITALS — BP 115/76 | HR 76 | Wt 213.0 lb

## 2020-10-08 DIAGNOSIS — O09522 Supervision of elderly multigravida, second trimester: Secondary | ICD-10-CM | POA: Diagnosis not present

## 2020-10-08 DIAGNOSIS — O359XX0 Maternal care for (suspected) fetal abnormality and damage, unspecified, not applicable or unspecified: Secondary | ICD-10-CM | POA: Diagnosis not present

## 2020-10-08 DIAGNOSIS — Z3A23 23 weeks gestation of pregnancy: Secondary | ICD-10-CM | POA: Diagnosis not present

## 2020-10-08 DIAGNOSIS — O0992 Supervision of high risk pregnancy, unspecified, second trimester: Secondary | ICD-10-CM | POA: Diagnosis not present

## 2020-10-08 DIAGNOSIS — O09529 Supervision of elderly multigravida, unspecified trimester: Secondary | ICD-10-CM

## 2020-10-08 DIAGNOSIS — O0990 Supervision of high risk pregnancy, unspecified, unspecified trimester: Secondary | ICD-10-CM

## 2020-10-08 NOTE — Progress Notes (Signed)
   PRENATAL VISIT NOTE  Subjective:  Tanya Bailey is a 35 y.o. [phone] at [redacted]w[redacted]d being seen today for ongoing prenatal care.  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  Patient reports no complaints.  Contractions: Not present. Vag. Bleeding: None.  Movement: Present. Denies leaking of fluid.   The following portions of the patient's history were reviewed and updated as appropriate: allergies, current medications, past family history, past medical history, past social history, past surgical history and problem list.   Objective:   Vitals:   10/08/20 1344  BP: 115/76  Pulse: 76  Weight: 213 lb (96.6 kg)    Fetal Status: Fetal Heart Rate (bpm): 143   Movement: Present     General:  Alert, oriented and cooperative. Patient is in no acute distress.  Skin: Skin is warm and dry. No rash noted.   Cardiovascular: Normal heart rate noted  Respiratory: Normal respiratory effort, no problems with respiration noted  Abdomen: Soft, gravid, appropriate for gestational age.  Pain/Pressure: Absent     Pelvic: Cervical exam deferred        Extremities: Normal range of motion.  Edema: None  Mental Status: Normal mood and affect. Normal behavior. Normal judgment and thought content.   Assessment and Plan:  Pregnancy: [id number] at [redacted]w[redacted]d 1. Supervision of high risk pregnancy, antepartum Multiple anomalies with normal genetic so far, has genetic referral at [facility] Patient Active Problem List   Diagnosis Date Noted  . 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 History of gestational hypertension  08/14/2020  . Unwanted fertility 07/17/2020  . Antepartum multigravida of advanced maternal age 74/23/2022  . Supervision of high risk pregnancy, antepartum 05/29/2020  . History of preterm delivery, currently pregnant 10/29/2016    Preterm labor symptoms and general obstetric precautions including but not limited to vaginal bleeding, contractions, leaking of fluid and fetal movement were reviewed in detail with the patient. Please refer to After Visit Summary for other counseling recommendations.   Return in about 4 weeks (around 11/05/2020) for 2 hr gtt.  Future Appointments  Date Time Provider Department Center  10/17/2020 10:30 AM [facility] NURSE [facility]  10/17/2020 10:45 AM [facility] US6 [facility]  11/01/2020 12:30 PM [facility] NURSE [facility]  11/01/2020 12:45 PM [facility] US4 [facility]  11/01/2020  2:15 PM [facility] NST [facility]  11/05/2020  8:15 AM [facility] LAB [facility] None  11/05/2020  9:45 AM Constant, Gigi Gin, MD [facility] None    Scheryl Darter, MD

## 2020-10-10 ENCOUNTER — Telehealth: Payer: Self-pay | Admitting: Obstetrics and Gynecology

## 2020-10-10 NOTE — Telephone Encounter (Signed)
I spoke with [name] to follow up with her on the referral we placed to [facility] for a prenatal consultation due to the radial ray defect seen on ultrasound.  [facility] has tried to reach the patient without success, therefore I asked [name] to call them directly to schedule with Dr. Dierdre Bailey, at [phone].  The patient stated she would contact them to make arrangements.  Secondly, the patient desires to participate in a WES (Whole Exome Sequencing) study at [facility] for this pregnancy, which requires a blood sample from the patient and the father of the pregnancy. Two small lavender tubes of blood are needed on each parent. [name] will plan to have her blood drawn at her follow up ultrasound on 10/17/20 in our [place] office.  I spoke with the father of the pregnancy, Tanya Bailey (dob [birth year], phone [phone]), and he would prefer to come to our clinic in [place].  We scheduled him to come for a lab only visit on Tuesday, 10/15/20 at 3:30 pm for his blood to be drawn and shipped to [facility].  The [employer] forms have been provided by the study genetic counseling, Beacher May.   If there are any questions, I may be reached at [phone].  Cherly Anderson, MS, CGC

## 2020-10-17 ENCOUNTER — Other Ambulatory Visit: Payer: Self-pay

## 2020-10-17 ENCOUNTER — Ambulatory Visit: Payer: Medicaid Other

## 2020-10-17 ENCOUNTER — Encounter: Payer: Self-pay | Admitting: *Deleted

## 2020-10-17 ENCOUNTER — Ambulatory Visit: Payer: Medicaid Other | Admitting: *Deleted

## 2020-10-17 ENCOUNTER — Ambulatory Visit: Payer: Medicaid Other | Attending: Maternal & Fetal Medicine

## 2020-10-17 VITALS — BP 108/73 | HR 73

## 2020-10-17 DIAGNOSIS — Z3A24 24 weeks gestation of pregnancy: Secondary | ICD-10-CM

## 2020-10-17 DIAGNOSIS — O359XX0 Maternal care for (suspected) fetal abnormality and damage, unspecified, not applicable or unspecified: Secondary | ICD-10-CM

## 2020-10-17 DIAGNOSIS — Z362 Encounter for other antenatal screening follow-up: Secondary | ICD-10-CM | POA: Diagnosis not present

## 2020-10-17 DIAGNOSIS — O0990 Supervision of high risk pregnancy, unspecified, unspecified trimester: Secondary | ICD-10-CM | POA: Diagnosis present

## 2020-10-17 DIAGNOSIS — O09212 Supervision of pregnancy with history of pre-term labor, second trimester: Secondary | ICD-10-CM

## 2020-10-17 DIAGNOSIS — O99212 Obesity complicating pregnancy, second trimester: Secondary | ICD-10-CM | POA: Diagnosis not present

## 2020-10-17 DIAGNOSIS — O365920 Maternal care for other known or suspected poor fetal growth, second trimester, not applicable or unspecified: Secondary | ICD-10-CM | POA: Diagnosis not present

## 2020-10-17 DIAGNOSIS — O321XX0 Maternal care for breech presentation, not applicable or unspecified: Secondary | ICD-10-CM

## 2020-10-17 DIAGNOSIS — O0942 Supervision of pregnancy with grand multiparity, second trimester: Secondary | ICD-10-CM

## 2020-10-17 DIAGNOSIS — Z148 Genetic carrier of other disease: Secondary | ICD-10-CM

## 2020-10-17 DIAGNOSIS — O09522 Supervision of elderly multigravida, second trimester: Secondary | ICD-10-CM

## 2020-10-17 DIAGNOSIS — E669 Obesity, unspecified: Secondary | ICD-10-CM | POA: Diagnosis not present

## 2020-11-01 ENCOUNTER — Encounter: Payer: Self-pay | Admitting: *Deleted

## 2020-11-01 ENCOUNTER — Other Ambulatory Visit: Payer: Self-pay | Admitting: *Deleted

## 2020-11-01 ENCOUNTER — Ambulatory Visit: Payer: Medicaid Other

## 2020-11-01 ENCOUNTER — Other Ambulatory Visit: Payer: Self-pay

## 2020-11-01 ENCOUNTER — Ambulatory Visit: Payer: Medicaid Other | Attending: Maternal & Fetal Medicine

## 2020-11-01 ENCOUNTER — Ambulatory Visit: Payer: Medicaid Other | Admitting: *Deleted

## 2020-11-01 ENCOUNTER — Ambulatory Visit (HOSPITAL_BASED_OUTPATIENT_CLINIC_OR_DEPARTMENT_OTHER): Payer: Medicaid Other | Admitting: Obstetrics

## 2020-11-01 VITALS — BP 106/69 | HR 87

## 2020-11-01 DIAGNOSIS — O0990 Supervision of high risk pregnancy, unspecified, unspecified trimester: Secondary | ICD-10-CM | POA: Insufficient documentation

## 2020-11-01 DIAGNOSIS — O09522 Supervision of elderly multigravida, second trimester: Secondary | ICD-10-CM

## 2020-11-01 DIAGNOSIS — O99212 Obesity complicating pregnancy, second trimester: Secondary | ICD-10-CM

## 2020-11-01 DIAGNOSIS — O0942 Supervision of pregnancy with grand multiparity, second trimester: Secondary | ICD-10-CM | POA: Diagnosis not present

## 2020-11-01 DIAGNOSIS — O09212 Supervision of pregnancy with history of pre-term labor, second trimester: Secondary | ICD-10-CM

## 2020-11-01 DIAGNOSIS — O359XX0 Maternal care for (suspected) fetal abnormality and damage, unspecified, not applicable or unspecified: Secondary | ICD-10-CM | POA: Diagnosis not present

## 2020-11-01 DIAGNOSIS — O365920 Maternal care for other known or suspected poor fetal growth, second trimester, not applicable or unspecified: Secondary | ICD-10-CM | POA: Diagnosis not present

## 2020-11-01 DIAGNOSIS — Z3A26 26 weeks gestation of pregnancy: Secondary | ICD-10-CM

## 2020-11-01 DIAGNOSIS — O365990 Maternal care for other known or suspected poor fetal growth, unspecified trimester, not applicable or unspecified: Secondary | ICD-10-CM

## 2020-11-01 DIAGNOSIS — O365921 Maternal care for other known or suspected poor fetal growth, second trimester, fetus 1: Secondary | ICD-10-CM | POA: Insufficient documentation

## 2020-11-01 DIAGNOSIS — Z148 Genetic carrier of other disease: Secondary | ICD-10-CM

## 2020-11-01 DIAGNOSIS — Z362 Encounter for other antenatal screening follow-up: Secondary | ICD-10-CM | POA: Diagnosis present

## 2020-11-01 DIAGNOSIS — E669 Obesity, unspecified: Secondary | ICD-10-CM

## 2020-11-01 NOTE — Progress Notes (Signed)
MFM Note   Tanya Bailey was seen for a follow up growth scan due to fetal growth restriction noted during her prior ultrasound exams.  Her fetus has a known arm/hand abnormality.  She had a normal fetal echocardiogram.  She denies any problems since her last exam and reports feeling fetal movements throughout the day.    On today's exam, the EFW measures at the third percentile for her gestational age indicating fetal growth restriction.  The fetus has grown 11 ounces over the past 3 weeks.  There was normal amniotic fluid noted.    Vigorous fetal movements were noted throughout today's exam.  Doppler studies of the umbilical arteries showed a normal S/D ratio of 3.46.  There were no signs of absent or reversed end-diastolic flow.    A double bubble was noted on today's ultrasound exam.  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  She was advised that duodenal atresia is often noted in fetuses with Down syndrome.  She was reassured by the normal chromosomes noted on her amniocentesis.  We will refer her for a prenatal consultation with pediatric surgery and will update her status on the [facility] list.  The patient understands that her baby may still have a genetic syndrome that may not have been diagnosed through the amniocentesis.  Due to fetal growth restriction, we will continue to follow her with weekly umbilical artery Doppler studies.  We will start weekly NSTs at 28 weeks.  We will reassess the fetal growth again in 3 weeks.    Another umbilical artery Doppler study was scheduled in 1 week.    A total of 15 minutes was spent counseling and coordinating the care for this patient.  Greater than 50% of the time was spent in direct face-to-face contact.

## 2020-11-05 ENCOUNTER — Other Ambulatory Visit: Payer: Medicaid Other

## 2020-11-05 ENCOUNTER — Encounter: Payer: Medicaid Other | Admitting: Obstetrics and Gynecology

## 2020-11-07 ENCOUNTER — Other Ambulatory Visit: Payer: Self-pay

## 2020-11-07 ENCOUNTER — Ambulatory Visit: Payer: Medicaid Other | Admitting: *Deleted

## 2020-11-07 ENCOUNTER — Ambulatory Visit (HOSPITAL_BASED_OUTPATIENT_CLINIC_OR_DEPARTMENT_OTHER): Payer: Medicaid Other | Admitting: *Deleted

## 2020-11-07 ENCOUNTER — Ambulatory Visit: Payer: Medicaid Other | Attending: Obstetrics

## 2020-11-07 VITALS — BP 116/60 | HR 72

## 2020-11-07 DIAGNOSIS — O0942 Supervision of pregnancy with grand multiparity, second trimester: Secondary | ICD-10-CM

## 2020-11-07 DIAGNOSIS — Z8759 Personal history of other complications of pregnancy, childbirth and the puerperium: Secondary | ICD-10-CM | POA: Insufficient documentation

## 2020-11-07 DIAGNOSIS — O09212 Supervision of pregnancy with history of pre-term labor, second trimester: Secondary | ICD-10-CM

## 2020-11-07 DIAGNOSIS — O365990 Maternal care for other known or suspected poor fetal growth, unspecified trimester, not applicable or unspecified: Secondary | ICD-10-CM | POA: Diagnosis not present

## 2020-11-07 DIAGNOSIS — O99212 Obesity complicating pregnancy, second trimester: Secondary | ICD-10-CM | POA: Diagnosis not present

## 2020-11-07 DIAGNOSIS — O0990 Supervision of high risk pregnancy, unspecified, unspecified trimester: Secondary | ICD-10-CM

## 2020-11-07 DIAGNOSIS — E669 Obesity, unspecified: Secondary | ICD-10-CM | POA: Diagnosis not present

## 2020-11-07 DIAGNOSIS — O359XX0 Maternal care for (suspected) fetal abnormality and damage, unspecified, not applicable or unspecified: Secondary | ICD-10-CM

## 2020-11-07 DIAGNOSIS — Z3A27 27 weeks gestation of pregnancy: Secondary | ICD-10-CM

## 2020-11-07 DIAGNOSIS — O09522 Supervision of elderly multigravida, second trimester: Secondary | ICD-10-CM

## 2020-11-07 DIAGNOSIS — O365920 Maternal care for other known or suspected poor fetal growth, second trimester, not applicable or unspecified: Secondary | ICD-10-CM

## 2020-11-07 DIAGNOSIS — Z148 Genetic carrier of other disease: Secondary | ICD-10-CM

## 2020-11-07 NOTE — Procedures (Signed)
Tanya Bailey April 26, 1986 [redacted]w[redacted]d  Fetus A Non-Stress Test Interpretation for 11/07/20  Indication: IUGR  Fetal Heart Rate A Mode: External Baseline Rate (A): 150 bpm Variability: Minimal Accelerations: 10 x 10 Decelerations: None Multiple birth?: No  Uterine Activity Mode: Palpation, Toco Contraction Frequency (min): none Resting Tone Palpated: Relaxed Resting Time: Adequate  Interpretation (Fetal Testing) Nonstress Test Interpretation: Reactive Overall Impression: Reassuring for gestational age Comments: Dr. Judeth Cornfield reviewed tracing.

## 2020-11-11 ENCOUNTER — Encounter (HOSPITAL_COMMUNITY): Payer: Self-pay | Admitting: Obstetrics & Gynecology

## 2020-11-11 ENCOUNTER — Inpatient Hospital Stay (HOSPITAL_COMMUNITY)
Admission: AD | Admit: 2020-11-11 | Discharge: 2020-11-12 | Disposition: A | Discharge status: Home / Self Care | Payer: Medicaid Other | Attending: Obstetrics & Gynecology | Admitting: Obstetrics & Gynecology

## 2020-11-11 DIAGNOSIS — O4703 False labor before 37 completed weeks of gestation, third trimester: Secondary | ICD-10-CM | POA: Diagnosis not present

## 2020-11-11 DIAGNOSIS — O09523 Supervision of elderly multigravida, third trimester: Secondary | ICD-10-CM | POA: Diagnosis not present

## 2020-11-11 DIAGNOSIS — O26893 Other specified pregnancy related conditions, third trimester: Secondary | ICD-10-CM | POA: Diagnosis present

## 2020-11-11 DIAGNOSIS — Z3A28 28 weeks gestation of pregnancy: Secondary | ICD-10-CM | POA: Diagnosis not present

## 2020-11-11 DIAGNOSIS — O359XX0 Maternal care for (suspected) fetal abnormality and damage, unspecified, not applicable or unspecified: Secondary | ICD-10-CM

## 2020-11-11 DIAGNOSIS — R109 Unspecified abdominal pain: Secondary | ICD-10-CM | POA: Diagnosis not present

## 2020-11-11 DIAGNOSIS — O09293 Supervision of pregnancy with other poor reproductive or obstetric history, third trimester: Secondary | ICD-10-CM | POA: Diagnosis not present

## 2020-11-11 DIAGNOSIS — O358XX0 Maternal care for other (suspected) fetal abnormality and damage, not applicable or unspecified: Secondary | ICD-10-CM

## 2020-11-11 DIAGNOSIS — O4702 False labor before 37 completed weeks of gestation, second trimester: Secondary | ICD-10-CM | POA: Diagnosis not present

## 2020-11-11 DIAGNOSIS — Z3689 Encounter for other specified antenatal screening: Secondary | ICD-10-CM

## 2020-11-11 DIAGNOSIS — Z3A27 27 weeks gestation of pregnancy: Secondary | ICD-10-CM | POA: Diagnosis not present

## 2020-11-11 DIAGNOSIS — O0990 Supervision of high risk pregnancy, unspecified, unspecified trimester: Secondary | ICD-10-CM

## 2020-11-11 LAB — URINALYSIS, ROUTINE W REFLEX MICROSCOPIC
Bacteria, UA: NONE SEEN
Bilirubin Urine: NEGATIVE
Glucose, UA: NEGATIVE mg/dL
Hgb urine dipstick: NEGATIVE
Ketones, ur: NEGATIVE mg/dL
Leukocytes,Ua: NEGATIVE
Nitrite: NEGATIVE
Protein, ur: 30 mg/dL — AB
Specific Gravity, Urine: 1.028 (ref 1.005–1.030)
pH: 9 — ABNORMAL HIGH (ref 5.0–8.0)

## 2020-11-11 LAB — WET PREP, GENITAL
Clue Cells Wet Prep HPF POC: NONE SEEN
Sperm: NONE SEEN
Trich, Wet Prep: NONE SEEN

## 2020-11-11 MED ORDER — LACTATED RINGERS IV BOLUS
1000.0000 mL | Freq: Once | INTRAVENOUS | Status: AC
  Administered 2020-11-11: 1000 mL via INTRAVENOUS

## 2020-11-11 MED ORDER — NIFEDIPINE 10 MG PO CAPS
10.0000 mg | ORAL_CAPSULE | ORAL | Status: AC
  Administered 2020-11-11: 10 mg via ORAL
  Filled 2020-11-11 (×2): qty 1

## 2020-11-11 MED ORDER — TERBUTALINE SULFATE 1 MG/ML IJ SOLN
0.2500 mg | Freq: Once | INTRAMUSCULAR | Status: AC
  Administered 2020-11-12: 0.25 mg via SUBCUTANEOUS
  Filled 2020-11-11: qty 1

## 2020-11-11 NOTE — MAU Provider Note (Signed)
History     CSN: [id number]  Arrival date and time: 11/11/20 2143   Event Date/Time   First Provider Initiated Contact with Patient 11/11/20 2248      Chief Complaint  Patient presents with   Abdominal Pain   35 y.o. [id number] @27 .6 wks presenting with ctx and vaginal pressure. Reports BH ctx since 6pm then started having significant vaginal pressure. Denies VB, LOF, and discharge. Denies urinary sx. Reports +FM. No recent IC.    OB History     Gravida  8   Para  6   Term  2   Preterm  4   AB  1   Living  5      SAB  0   IAB  0   Ectopic  1   Multiple  0   Live Births  6           Past Medical History:  Diagnosis Date   Abnormal Pap smear    Anemia    Chlamydia    Ectopic pregnancy    Heart murmur    childhood   PID (acute pelvic inflammatory disease)    with [employer]   Positive GBS test 01/19/2017   Pregnancy induced hypertension    end of last preg   Preterm labor    Vaginal Pap smear, abnormal     Past Surgical History:  Procedure Laterality Date   TOOTH EXTRACTION      Family History  Problem Relation Age of Onset   Cancer Maternal Grandfather    Prostate cancer Paternal Grandfather    Cancer Paternal Grandfather    Anesthesia problems Neg Hx    Alcohol abuse Neg Hx    Arthritis Neg Hx    Asthma Neg Hx    Birth defects Neg Hx    COPD Neg Hx    Depression Neg Hx    Diabetes Neg Hx    Drug abuse Neg Hx    Early death Neg Hx    Hearing loss Neg Hx    Heart disease Neg Hx    Hyperlipidemia Neg Hx    Hypertension Neg Hx    Kidney disease Neg Hx    Learning disabilities Neg Hx    Mental illness Neg Hx    Mental retardation Neg Hx    Miscarriages / Stillbirths Neg Hx    Stroke Neg Hx    Vision loss Neg Hx    Other Neg Hx     Social History   Tobacco Use   Smoking status: Never   Smokeless tobacco: Never  Vaping Use   Vaping Use: Never used  Substance Use Topics   Alcohol use: No    Comment: Special occas. when not  pregnant   Drug use: No    Allergies:  Allergies  Allergen Reactions   Latex     Medications Prior to Admission  Medication Sig Dispense Refill Last Dose   Prenatal Vit-Fe Fumarate-FA (MULTIVITAMIN-PRENATAL) 27-0.8 MG TABS tablet Take 1 tablet by mouth daily at 12 noon.   11/11/2020   aspirin EC 81 MG tablet Take 1 tablet (81 mg total) by mouth daily. Take after 12 weeks for prevention of preeclampsia later in pregnancy 300 tablet 2     Review of Systems  Gastrointestinal:  Positive for abdominal pain. Anal bleeding: ctx. Genitourinary:  Positive for vaginal pain (pressure). Negative for dysuria, frequency, urgency and vaginal bleeding.  Physical Exam   Blood pressure (!) 111/56, pulse 73, temperature 98.1 F (  36.7 C), resp. rate 20, height 5\' 7"  (1.702 m), weight 98.6 kg, last menstrual period 04/30/2020, SpO2 100 %.  Physical Exam Vitals and nursing note reviewed. Exam conducted with a chaperone present.  Constitutional:      General: She is not in acute distress.    Appearance: Normal appearance.  HENT:     Head: Normocephalic and atraumatic.  Cardiovascular:     Rate and Rhythm: Normal rate.  Pulmonary:     Effort: Pulmonary effort is normal. No respiratory distress.  Abdominal:     General: There is no distension.     Palpations: Abdomen is soft. There is no mass.     Tenderness: There is no abdominal tenderness. There is no guarding or rebound.     Hernia: No hernia is present.     Comments: gravid  Genitourinary:    Comments: VE: closed/thick Musculoskeletal:        General: Normal range of motion.     Cervical back: Normal range of motion.  Skin:    General: Skin is warm and dry.  Neurological:     General: No focal deficit present.     Mental Status: She is alert and oriented to person, place, and time.  Psychiatric:        Mood and Affect: Mood normal.        Behavior: Behavior normal.  EFM: 145 bpm, mod variability, + accels, rare variable decels Toco:  2-6  Results for orders placed or performed during the hospital encounter of 11/11/20 (from the past 24 hour(s))  Wet prep, genital     Status: Abnormal   Collection Time: 11/11/20 10:50 PM   Specimen: Vaginal  Result Value Ref Range   Yeast Wet Prep HPF POC PRESENT (A) NONE SEEN   Trich, Wet Prep NONE SEEN NONE SEEN   Clue Cells Wet Prep HPF POC NONE SEEN NONE SEEN   WBC, Wet Prep HPF POC MANY (A) NONE SEEN   Sperm NONE SEEN   Urinalysis, Routine w reflex microscopic Urine, Clean Catch     Status: Abnormal   Collection Time: 11/11/20 11:14 PM  Result Value Ref Range   Color, Urine YELLOW YELLOW   APPearance CLEAR CLEAR   Specific Gravity, Urine 1.028 1.005 - 1.030   pH 9.0 (H) 5.0 - 8.0   Glucose, UA NEGATIVE NEGATIVE mg/dL   Hgb urine dipstick NEGATIVE NEGATIVE   Bilirubin Urine NEGATIVE NEGATIVE   Ketones, ur NEGATIVE NEGATIVE mg/dL   Protein, ur 30 (A) NEGATIVE mg/dL   Nitrite NEGATIVE NEGATIVE   Leukocytes,Ua NEGATIVE NEGATIVE   RBC / HPF 0-5 0 - 5 RBC/hpf   WBC, UA 0-5 0 - 5 WBC/hpf   Bacteria, UA NONE SEEN NONE SEEN   Squamous Epithelial / LPF 0-5 0 - 5   Mucus PRESENT     MAU Course  Procedures LR  Procardia x1 Terbutaline x1  MDM Records reviewed. Pregnancy complicated by hx of PTB, hx of gHTN, AMA, and multiple fetal anomalies. Labs ordered and reviewed.  0050: Feeling better after meds and IVF. Rare ctx on toco. Stable for discharge home.   Assessment and Plan   1. Supervision of high risk pregnancy, antepartum   2. 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 [redacted] weeks gestation of pregnancy   4. Preterm uterine contractions, antepartum, second trimester   5. NST (non-stress test) reactive    Discharge home Follow up at [facility] as scheduled PTL precautions  Allergies as of 11/12/2020       Reactions   Latex         Medication List     TAKE these medications    aspirin EC 81 MG tablet Take 1 tablet (81  mg total) by mouth daily. Take after 12 weeks for prevention of preeclampsia later in pregnancy   multivitamin-prenatal 27-0.8 MG Tabs tablet Take 1 tablet by mouth daily at 12 noon.        Donette Larry, CNM 11/12/2020, 1:11 AM

## 2020-11-11 NOTE — MAU Note (Signed)
PT SAYS FEELS UC -REG AND STRONG SINCE 6 PM LESS MOVEMENT - LAST TIME 930PM.  PNC WITH FAMINA  LAST SEX- MTHS AGO

## 2020-11-12 LAB — GC/CHLAMYDIA PROBE AMP (~~LOC~~) NOT AT ARMC
Chlamydia: NEGATIVE
Comment: NEGATIVE
Comment: NORMAL
Neisseria Gonorrhea: NEGATIVE

## 2020-11-14 ENCOUNTER — Other Ambulatory Visit: Payer: Self-pay

## 2020-11-14 ENCOUNTER — Encounter: Payer: Self-pay | Admitting: *Deleted

## 2020-11-14 ENCOUNTER — Ambulatory Visit: Payer: Medicaid Other | Admitting: *Deleted

## 2020-11-14 ENCOUNTER — Ambulatory Visit: Payer: Medicaid Other | Attending: Obstetrics

## 2020-11-14 VITALS — BP 131/70 | HR 73

## 2020-11-14 DIAGNOSIS — O09523 Supervision of elderly multigravida, third trimester: Secondary | ICD-10-CM | POA: Diagnosis not present

## 2020-11-14 DIAGNOSIS — O0943 Supervision of pregnancy with grand multiparity, third trimester: Secondary | ICD-10-CM

## 2020-11-14 DIAGNOSIS — O0990 Supervision of high risk pregnancy, unspecified, unspecified trimester: Secondary | ICD-10-CM

## 2020-11-14 DIAGNOSIS — O365990 Maternal care for other known or suspected poor fetal growth, unspecified trimester, not applicable or unspecified: Secondary | ICD-10-CM | POA: Insufficient documentation

## 2020-11-14 DIAGNOSIS — Z3A28 28 weeks gestation of pregnancy: Secondary | ICD-10-CM

## 2020-11-14 DIAGNOSIS — O365930 Maternal care for other known or suspected poor fetal growth, third trimester, not applicable or unspecified: Secondary | ICD-10-CM

## 2020-11-14 DIAGNOSIS — O359XX0 Maternal care for (suspected) fetal abnormality and damage, unspecified, not applicable or unspecified: Secondary | ICD-10-CM

## 2020-11-14 DIAGNOSIS — O99213 Obesity complicating pregnancy, third trimester: Secondary | ICD-10-CM

## 2020-11-14 DIAGNOSIS — O09213 Supervision of pregnancy with history of pre-term labor, third trimester: Secondary | ICD-10-CM

## 2020-11-14 DIAGNOSIS — Z148 Genetic carrier of other disease: Secondary | ICD-10-CM

## 2020-11-14 DIAGNOSIS — E669 Obesity, unspecified: Secondary | ICD-10-CM

## 2020-11-14 NOTE — Procedures (Signed)
Tanya Bailey 08/15/1985 [redacted]w[redacted]d  Fetus A Non-Stress Test Interpretation for 11/14/20  Indication: IUGR  Fetal Heart Rate A Mode: External Baseline Rate (A): 140 bpm Variability: Minimal, Moderate Accelerations: 10 x 10 Decelerations: None Multiple birth?: No  Uterine Activity Mode: Palpation, Toco Contraction Frequency (min): none Resting Tone Palpated: Relaxed Resting Time: Adequate  Interpretation (Fetal Testing) Nonstress Test Interpretation: Reactive Overall Impression: Reassuring for gestational age Comments: Dr. Parke Poisson reviewed tracing

## 2020-11-21 ENCOUNTER — Ambulatory Visit (HOSPITAL_BASED_OUTPATIENT_CLINIC_OR_DEPARTMENT_OTHER): Payer: Medicaid Other

## 2020-11-21 ENCOUNTER — Other Ambulatory Visit: Payer: Self-pay | Admitting: *Deleted

## 2020-11-21 ENCOUNTER — Ambulatory Visit: Payer: Medicaid Other | Attending: Obstetrics | Admitting: *Deleted

## 2020-11-21 ENCOUNTER — Other Ambulatory Visit: Payer: Self-pay

## 2020-11-21 ENCOUNTER — Encounter: Payer: Self-pay | Admitting: *Deleted

## 2020-11-21 ENCOUNTER — Ambulatory Visit: Payer: Medicaid Other | Admitting: Obstetrics

## 2020-11-21 VITALS — BP 129/81 | HR 75

## 2020-11-21 DIAGNOSIS — O359XX0 Maternal care for (suspected) fetal abnormality and damage, unspecified, not applicable or unspecified: Secondary | ICD-10-CM | POA: Diagnosis not present

## 2020-11-21 DIAGNOSIS — O09213 Supervision of pregnancy with history of pre-term labor, third trimester: Secondary | ICD-10-CM

## 2020-11-21 DIAGNOSIS — Z3A29 29 weeks gestation of pregnancy: Secondary | ICD-10-CM

## 2020-11-21 DIAGNOSIS — O365930 Maternal care for other known or suspected poor fetal growth, third trimester, not applicable or unspecified: Secondary | ICD-10-CM

## 2020-11-21 DIAGNOSIS — E669 Obesity, unspecified: Secondary | ICD-10-CM

## 2020-11-21 DIAGNOSIS — O09523 Supervision of elderly multigravida, third trimester: Secondary | ICD-10-CM | POA: Insufficient documentation

## 2020-11-21 DIAGNOSIS — O365990 Maternal care for other known or suspected poor fetal growth, unspecified trimester, not applicable or unspecified: Secondary | ICD-10-CM

## 2020-11-21 DIAGNOSIS — O0990 Supervision of high risk pregnancy, unspecified, unspecified trimester: Secondary | ICD-10-CM

## 2020-11-21 DIAGNOSIS — O99213 Obesity complicating pregnancy, third trimester: Secondary | ICD-10-CM | POA: Insufficient documentation

## 2020-11-21 DIAGNOSIS — O0943 Supervision of pregnancy with grand multiparity, third trimester: Secondary | ICD-10-CM | POA: Diagnosis not present

## 2020-11-21 DIAGNOSIS — Z148 Genetic carrier of other disease: Secondary | ICD-10-CM

## 2020-11-21 DIAGNOSIS — O09293 Supervision of pregnancy with other poor reproductive or obstetric history, third trimester: Secondary | ICD-10-CM | POA: Insufficient documentation

## 2020-11-21 NOTE — Procedures (Signed)
Tanya Bailey 1985-06-11 [redacted]w[redacted]d  Fetus A Non-Stress Test Interpretation for 11/21/20  Indication: IUGR  Fetal Heart Rate A Mode: External Baseline Rate (A): 140 bpm Variability: Moderate Accelerations: 10 x 10 Decelerations: None Multiple birth?: No  Uterine Activity Mode: Palpation, Toco Contraction Frequency (min): none Resting Tone Palpated: Relaxed Resting Time: Adequate  Interpretation (Fetal Testing) Nonstress Test Interpretation: Reactive Overall Impression: Reassuring for gestational age Comments: Dr. Parke Poisson reviewed tracing

## 2020-11-29 ENCOUNTER — Ambulatory Visit: Payer: Medicaid Other | Attending: Obstetrics

## 2020-11-29 ENCOUNTER — Ambulatory Visit: Payer: Medicaid Other

## 2020-12-05 ENCOUNTER — Ambulatory Visit: Payer: Medicaid Other | Admitting: *Deleted

## 2020-12-05 ENCOUNTER — Other Ambulatory Visit: Payer: Self-pay

## 2020-12-05 ENCOUNTER — Encounter: Payer: Self-pay | Admitting: *Deleted

## 2020-12-05 ENCOUNTER — Ambulatory Visit: Payer: Medicaid Other | Attending: Obstetrics

## 2020-12-05 VITALS — BP 117/80 | HR 80

## 2020-12-05 DIAGNOSIS — Z148 Genetic carrier of other disease: Secondary | ICD-10-CM

## 2020-12-05 DIAGNOSIS — O359XX0 Maternal care for (suspected) fetal abnormality and damage, unspecified, not applicable or unspecified: Secondary | ICD-10-CM

## 2020-12-05 DIAGNOSIS — O09523 Supervision of elderly multigravida, third trimester: Secondary | ICD-10-CM

## 2020-12-05 DIAGNOSIS — O365930 Maternal care for other known or suspected poor fetal growth, third trimester, not applicable or unspecified: Secondary | ICD-10-CM | POA: Diagnosis not present

## 2020-12-05 DIAGNOSIS — O0990 Supervision of high risk pregnancy, unspecified, unspecified trimester: Secondary | ICD-10-CM | POA: Diagnosis present

## 2020-12-05 DIAGNOSIS — O99213 Obesity complicating pregnancy, third trimester: Secondary | ICD-10-CM

## 2020-12-05 DIAGNOSIS — O0943 Supervision of pregnancy with grand multiparity, third trimester: Secondary | ICD-10-CM

## 2020-12-05 DIAGNOSIS — E669 Obesity, unspecified: Secondary | ICD-10-CM

## 2020-12-05 DIAGNOSIS — Z3A31 31 weeks gestation of pregnancy: Secondary | ICD-10-CM

## 2020-12-05 DIAGNOSIS — O09213 Supervision of pregnancy with history of pre-term labor, third trimester: Secondary | ICD-10-CM

## 2020-12-05 DIAGNOSIS — O321XX0 Maternal care for breech presentation, not applicable or unspecified: Secondary | ICD-10-CM

## 2020-12-05 NOTE — Procedures (Signed)
Tanya Bailey 10-24-1985 [redacted]w[redacted]d  Fetus A Non-Stress Test Interpretation for 12/05/20  Indication: IUGR  Fetal Heart Rate A Mode: External Baseline Rate (A): 135 bpm Variability: Moderate Accelerations: 15 x 15 Decelerations: None Multiple birth?: No  Uterine Activity Mode: Palpation, Toco Contraction Frequency (min): none Resting Tone Palpated: Relaxed Resting Time: Adequate  Interpretation (Fetal Testing) Nonstress Test Interpretation: Reactive Overall Impression: Reassuring for gestational age Comments: Dr. Parke Poisson reviewed tracing

## 2020-12-12 ENCOUNTER — Encounter: Payer: Self-pay | Admitting: *Deleted

## 2020-12-12 ENCOUNTER — Ambulatory Visit: Payer: Medicaid Other | Admitting: *Deleted

## 2020-12-12 ENCOUNTER — Other Ambulatory Visit: Payer: Self-pay

## 2020-12-12 ENCOUNTER — Ambulatory Visit: Payer: Medicaid Other | Attending: Obstetrics

## 2020-12-12 VITALS — BP 120/73 | HR 75

## 2020-12-12 DIAGNOSIS — Z148 Genetic carrier of other disease: Secondary | ICD-10-CM

## 2020-12-12 DIAGNOSIS — O359XX0 Maternal care for (suspected) fetal abnormality and damage, unspecified, not applicable or unspecified: Secondary | ICD-10-CM

## 2020-12-12 DIAGNOSIS — O365930 Maternal care for other known or suspected poor fetal growth, third trimester, not applicable or unspecified: Secondary | ICD-10-CM

## 2020-12-12 DIAGNOSIS — O403XX0 Polyhydramnios, third trimester, not applicable or unspecified: Secondary | ICD-10-CM

## 2020-12-12 DIAGNOSIS — O09523 Supervision of elderly multigravida, third trimester: Secondary | ICD-10-CM

## 2020-12-12 DIAGNOSIS — O0990 Supervision of high risk pregnancy, unspecified, unspecified trimester: Secondary | ICD-10-CM | POA: Insufficient documentation

## 2020-12-12 DIAGNOSIS — E669 Obesity, unspecified: Secondary | ICD-10-CM

## 2020-12-12 DIAGNOSIS — O99213 Obesity complicating pregnancy, third trimester: Secondary | ICD-10-CM | POA: Diagnosis not present

## 2020-12-12 DIAGNOSIS — O0943 Supervision of pregnancy with grand multiparity, third trimester: Secondary | ICD-10-CM

## 2020-12-12 DIAGNOSIS — O09213 Supervision of pregnancy with history of pre-term labor, third trimester: Secondary | ICD-10-CM

## 2020-12-12 DIAGNOSIS — Z3A32 32 weeks gestation of pregnancy: Secondary | ICD-10-CM

## 2020-12-12 NOTE — Procedures (Signed)
Tanya Bailey Mar 17, 1986 [redacted]w[redacted]d  Fetus A Non-Stress Test Interpretation for 12/12/20  Indication: IUGR  Fetal Heart Rate A Mode: External Baseline Rate (A): 140 bpm Variability: Moderate Accelerations: 15 x 15 Decelerations: None Multiple birth?: No  Uterine Activity Mode: Palpation, Toco Contraction Frequency (min): none Resting Tone Palpated: Relaxed  Interpretation (Fetal Testing) Nonstress Test Interpretation: Reactive Overall Impression: Reassuring for gestational age Comments: Dr. Parke Poisson reviewed tracing

## 2020-12-19 ENCOUNTER — Ambulatory Visit: Payer: Medicaid Other | Attending: Obstetrics

## 2020-12-19 ENCOUNTER — Encounter: Payer: Self-pay | Admitting: *Deleted

## 2020-12-19 ENCOUNTER — Other Ambulatory Visit: Payer: Self-pay

## 2020-12-19 ENCOUNTER — Ambulatory Visit: Payer: Medicaid Other | Admitting: *Deleted

## 2020-12-19 VITALS — BP 123/81 | HR 89

## 2020-12-19 DIAGNOSIS — O365930 Maternal care for other known or suspected poor fetal growth, third trimester, not applicable or unspecified: Secondary | ICD-10-CM | POA: Insufficient documentation

## 2020-12-19 DIAGNOSIS — O09213 Supervision of pregnancy with history of pre-term labor, third trimester: Secondary | ICD-10-CM | POA: Diagnosis not present

## 2020-12-19 DIAGNOSIS — O359XX0 Maternal care for (suspected) fetal abnormality and damage, unspecified, not applicable or unspecified: Secondary | ICD-10-CM

## 2020-12-19 DIAGNOSIS — Z148 Genetic carrier of other disease: Secondary | ICD-10-CM

## 2020-12-19 DIAGNOSIS — E669 Obesity, unspecified: Secondary | ICD-10-CM

## 2020-12-19 DIAGNOSIS — O0990 Supervision of high risk pregnancy, unspecified, unspecified trimester: Secondary | ICD-10-CM | POA: Diagnosis present

## 2020-12-19 DIAGNOSIS — O403XX0 Polyhydramnios, third trimester, not applicable or unspecified: Secondary | ICD-10-CM | POA: Diagnosis not present

## 2020-12-19 DIAGNOSIS — O09523 Supervision of elderly multigravida, third trimester: Secondary | ICD-10-CM

## 2020-12-19 DIAGNOSIS — Z3A33 33 weeks gestation of pregnancy: Secondary | ICD-10-CM

## 2020-12-19 DIAGNOSIS — O0943 Supervision of pregnancy with grand multiparity, third trimester: Secondary | ICD-10-CM

## 2020-12-19 DIAGNOSIS — O99213 Obesity complicating pregnancy, third trimester: Secondary | ICD-10-CM

## 2020-12-19 NOTE — Procedures (Signed)
Tanya Bailey 1986-01-11 [redacted]w[redacted]d  Fetus A Non-Stress Test Interpretation for 12/19/20  Indication: IUGR and Fetal anomalies  Fetal Heart Rate A Mode: External Baseline Rate (A): 140 bpm Variability: Moderate Accelerations: 15 x 15 Decelerations: None Multiple birth?: No  Uterine Activity Mode: Palpation, Toco Contraction Frequency (min): 1 UC w/UI Contraction Quality: Mild Resting Tone Palpated: Relaxed Resting Time: Adequate  Interpretation (Fetal Testing) Nonstress Test Interpretation: Reactive Comments: Poor quality tracing at times. Dr. Judeth Cornfield reviewed tracing.

## 2020-12-20 ENCOUNTER — Other Ambulatory Visit: Payer: Self-pay | Admitting: *Deleted

## 2020-12-20 DIAGNOSIS — Z362 Encounter for other antenatal screening follow-up: Secondary | ICD-10-CM

## 2020-12-20 DIAGNOSIS — O365990 Maternal care for other known or suspected poor fetal growth, unspecified trimester, not applicable or unspecified: Secondary | ICD-10-CM

## 2020-12-24 ENCOUNTER — Inpatient Hospital Stay (HOSPITAL_COMMUNITY)
Admission: AD | Admit: 2020-12-24 | Discharge: 2020-12-24 | Disposition: A | Discharge status: Home / Self Care | Payer: Medicaid Other | Attending: Obstetrics & Gynecology | Admitting: Obstetrics & Gynecology

## 2020-12-24 ENCOUNTER — Other Ambulatory Visit: Payer: Self-pay

## 2020-12-24 ENCOUNTER — Encounter (HOSPITAL_COMMUNITY): Payer: Self-pay | Admitting: Obstetrics & Gynecology

## 2020-12-24 DIAGNOSIS — Z79899 Other long term (current) drug therapy: Secondary | ICD-10-CM | POA: Insufficient documentation

## 2020-12-24 DIAGNOSIS — O4703 False labor before 37 completed weeks of gestation, third trimester: Secondary | ICD-10-CM | POA: Diagnosis present

## 2020-12-24 DIAGNOSIS — O09293 Supervision of pregnancy with other poor reproductive or obstetric history, third trimester: Secondary | ICD-10-CM | POA: Insufficient documentation

## 2020-12-24 DIAGNOSIS — O0990 Supervision of high risk pregnancy, unspecified, unspecified trimester: Secondary | ICD-10-CM

## 2020-12-24 DIAGNOSIS — D6959 Other secondary thrombocytopenia: Secondary | ICD-10-CM | POA: Insufficient documentation

## 2020-12-24 DIAGNOSIS — Z3A34 34 weeks gestation of pregnancy: Secondary | ICD-10-CM | POA: Diagnosis not present

## 2020-12-24 DIAGNOSIS — O403XX0 Polyhydramnios, third trimester, not applicable or unspecified: Secondary | ICD-10-CM | POA: Diagnosis not present

## 2020-12-24 DIAGNOSIS — O09213 Supervision of pregnancy with history of pre-term labor, third trimester: Secondary | ICD-10-CM | POA: Insufficient documentation

## 2020-12-24 DIAGNOSIS — O359XX0 Maternal care for (suspected) fetal abnormality and damage, unspecified, not applicable or unspecified: Secondary | ICD-10-CM

## 2020-12-24 DIAGNOSIS — O4700 False labor before 37 completed weeks of gestation, unspecified trimester: Secondary | ICD-10-CM

## 2020-12-24 NOTE — MAU Note (Addendum)
PT states she wanted to get checked for labor because she was admitted to [facility] yesterday for preterm labor that started while she was at work. She made cervical change from 2-5 cm while she was there. They were able to stop labor, but they wanted her to stay. She had to leave AMA due to childcare concerns. Only feeling mild cramping today. Denies LOF or VB. +FM, but has polyhydramnios. Baby has anomalies which require surgery at [facility] after delivery. Patient goes to [facility]. Wants to deliver here in [place] if possible.   PT had 2 doses of BMTZ at [facility]. Also had GBS swab.

## 2020-12-24 NOTE — MAU Provider Note (Signed)
History    [id number]  Arrival date and time: 12/24/20 1155   Chief Complaint  Patient presents with   Labor Eval   HPI JAYLIANA VALENCIA is a 35 y.o. at [redacted]w[redacted]d by LMP who presents to MAU for follow up after admission for preterm labor at [facility]. Her pregnancy thus far has been complicated by gestational thrombocytopenia, polyhydramnios, AMA, hx preterm delivery, hx of GHTN, and multiple fetal anomalies this pregnancy including double bubble sign on fetal US, missing radial bone of the RUE, and concern for VACTERL. Patient reports that she went to [employer] from her work on Saturday 8/27 for regular contractions. Her cervix changed from ~2-5 cm and therefore she was admitted for steroids, tocolysis, and further monitoring. Patient reports that she received 2 doses of Betamethasone in addition to Procardia daily while admitted. Her contractions stopped and she asked to leave last evening due to need to take care of her other children. She signed AMA paperwork and was advised to follow up today to make sure that she was not developing contractions again. Today, she reports that she has not felt any contractions since her discharge yesterday. She denies vaginal discharge or dysuria. She has no other complaints at this time.  Vaginal bleeding: No LOF: No Fetal Movement: Yes Contractions: No  O/Positive/-- (03/23 1039)  OB History     Gravida  8   Para  6   Term  2   Preterm  4   AB  1   Living  5      SAB  0   IAB  0   Ectopic  1   Multiple  0   Live Births  6           Past Medical History:  Diagnosis Date   Abnormal Pap smear    Anemia    Chlamydia    Ectopic pregnancy    Heart murmur    childhood   PID (acute pelvic inflammatory disease)    with Paraguard   Positive GBS test 01/19/2017   Pregnancy induced hypertension    end of last preg   Preterm labor    Vaginal Pap smear, abnormal     Past Surgical History:  Procedure Laterality Date   TOOTH EXTRACTION       Family History  Problem Relation Age of Onset   Cancer Maternal Grandfather    Prostate cancer Paternal Grandfather    Cancer Paternal Grandfather    Anesthesia problems Neg Hx    Alcohol abuse Neg Hx    Arthritis Neg Hx    Asthma Neg Hx    Birth defects Neg Hx    COPD Neg Hx    Depression Neg Hx    Diabetes Neg Hx    Drug abuse Neg Hx    Early death Neg Hx    Hearing loss Neg Hx    Heart disease Neg Hx    Hyperlipidemia Neg Hx    Hypertension Neg Hx    Kidney disease Neg Hx    Learning disabilities Neg Hx    Mental illness Neg Hx    Mental retardation Neg Hx    Miscarriages / Stillbirths Neg Hx    Stroke Neg Hx    Vision loss Neg Hx    Other Neg Hx     Social History   Socioeconomic History   Marital status: Single    Spouse name: Not on file   Number of children: 5   Years of  education: Not on file   Highest education level: Not on file  Occupational History   Not on file  Tobacco Use   Smoking status: Never   Smokeless tobacco: Never  Vaping Use   Vaping Use: Never used  Substance and Sexual Activity   Alcohol use: No    Comment: Special occas. when not pregnant   Drug use: No   Sexual activity: Not Currently    Birth control/protection: Condom    Comment: Last intercourse-last week  Other Topics Concern   Not on file  Social History Narrative   Not on file   Social Determinants of Health   Financial Resource Strain: Not on file  Food Insecurity: Not on file  Transportation Needs: Not on file  Physical Activity: Not on file  Stress: Not on file  Social Connections: Not on file  Intimate Partner Violence: Not on file    Allergies  Allergen Reactions   Latex     No current facility-administered medications on file prior to encounter.   Current Outpatient Medications on File Prior to Encounter  Medication Sig Dispense Refill   Prenatal Vit-Fe Fumarate-FA (MULTIVITAMIN-PRENATAL) 27-0.8 MG TABS tablet Take 1 tablet by mouth daily at 12  noon.     aspirin EC 81 MG tablet Take 1 tablet (81 mg total) by mouth daily. Take after 12 weeks for prevention of preeclampsia later in pregnancy (Patient not taking: Reported on 12/05/2020) 300 tablet 2   [DISCONTINUED] hydrALAZINE (APRESOLINE) 10 MG tablet Take 1 tablet (10 mg total) by mouth 3 (three) times daily. 90 tablet 0     ROS Pertinent positives and negative per HPI, all others reviewed and negative  Physical Exam   BP 108/60   Pulse (!) 105   Temp 98.6 F (37 C) (Oral)   Resp 18   Ht 5\' 8"  (1.727 m)   Wt 100.7 kg   LMP 04/30/2020 (Exact Date)   SpO2 99%   BMI 33.75 kg/m   Physical Exam Constitutional:      General: She is not in acute distress.    Appearance: Normal appearance.  HENT:     Head: Normocephalic and atraumatic.     Mouth/Throat:     Mouth: Mucous membranes are moist.  Eyes:     Conjunctiva/sclera: Conjunctivae normal.  Cardiovascular:     Rate and Rhythm: Normal rate.  Pulmonary:     Effort: Pulmonary effort is normal.  Abdominal:     Palpations: Abdomen is soft.     Tenderness: There is no abdominal tenderness.     Comments: Gravid  Musculoskeletal:     Cervical back: Normal range of motion.     Right lower leg: No edema.     Left lower leg: No edema.  Skin:    General: Skin is warm and dry.  Neurological:     Mental Status: She is alert and oriented to person, place, and time.  Psychiatric:        Mood and Affect: Mood normal.        Behavior: Behavior normal.   Cervical Exam Dilation: 5 Effacement (%): 50 Presentation: Vertex Exam by:: N Nugent NP  Dilation: 4.5 Effacement (%): 50 Station: -3 Presentation: Vertex Exam by: [email or web address], MD   FHT Baseline 125 bpm, moderate variability, + accels, - decels Toco: Irregular contractions  Labs No results found for this or any previous visit (from the past 24 hour(s)).  Imaging No results found.  MAU Course  Procedures Lab Orders  No laboratory test(s) ordered  today   No orders of the defined types were placed in this encounter.  Imaging Orders  No imaging studies ordered today    MDM: Patient presents for follow up after recent admission at [facility] for preterm labor. Contractions stopped with tocolysis (Procardia). Patient received Betamethasone x2.  No longer feeling painful contractions.  SVE unchanged after 1.5 hours. Similar to SVE at [facility].  FHT reactive; Cat 1 tracing.  Stable for discharge home.  Patient to follow up with [facility] for [facility] care; MFM follow up continues to be with [facility].  Assessment and Plan   1. Preterm contractions - No signs of labor at this time - Reviewed preterm labor signs/symptoms and patient voiced understanding - Patient has follow up scheduled with [facility] OB for routine follow up - Also has MFM follow up at [facility] - Patient to deliver at [facility] due to fetal anomalies - Patient voiced understanding and agreeable to plan  - Discharge home with return precautions   Worthy Rancher, MD [facility] Fellow  Faculty Practice

## 2020-12-26 ENCOUNTER — Ambulatory Visit: Payer: Medicaid Other | Admitting: *Deleted

## 2020-12-26 ENCOUNTER — Encounter: Payer: Self-pay | Admitting: *Deleted

## 2020-12-26 ENCOUNTER — Other Ambulatory Visit: Payer: Self-pay

## 2020-12-26 ENCOUNTER — Ambulatory Visit: Payer: Medicaid Other | Attending: Obstetrics & Gynecology

## 2020-12-26 ENCOUNTER — Ambulatory Visit (HOSPITAL_BASED_OUTPATIENT_CLINIC_OR_DEPARTMENT_OTHER): Payer: Medicaid Other | Admitting: *Deleted

## 2020-12-26 VITALS — BP 123/71 | HR 73

## 2020-12-26 DIAGNOSIS — O09523 Supervision of elderly multigravida, third trimester: Secondary | ICD-10-CM | POA: Diagnosis not present

## 2020-12-26 DIAGNOSIS — Z3A34 34 weeks gestation of pregnancy: Secondary | ICD-10-CM | POA: Diagnosis not present

## 2020-12-26 DIAGNOSIS — O358XX0 Maternal care for other (suspected) fetal abnormality and damage, not applicable or unspecified: Secondary | ICD-10-CM

## 2020-12-26 DIAGNOSIS — O0990 Supervision of high risk pregnancy, unspecified, unspecified trimester: Secondary | ICD-10-CM

## 2020-12-26 DIAGNOSIS — O359XX0 Maternal care for (suspected) fetal abnormality and damage, unspecified, not applicable or unspecified: Secondary | ICD-10-CM | POA: Diagnosis present

## 2020-12-26 DIAGNOSIS — O365990 Maternal care for other known or suspected poor fetal growth, unspecified trimester, not applicable or unspecified: Secondary | ICD-10-CM | POA: Insufficient documentation

## 2020-12-26 DIAGNOSIS — Z362 Encounter for other antenatal screening follow-up: Secondary | ICD-10-CM | POA: Insufficient documentation

## 2020-12-26 DIAGNOSIS — O365930 Maternal care for other known or suspected poor fetal growth, third trimester, not applicable or unspecified: Secondary | ICD-10-CM | POA: Insufficient documentation

## 2020-12-26 NOTE — Procedures (Signed)
Tanya Bailey Nov 18, 1985 [redacted]w[redacted]d  Fetus A Non-Stress Test Interpretation for 12/26/20  Indication: IUGR  Fetal Heart Rate A Mode: External Baseline Rate (A): 140 bpm Variability: Moderate Accelerations: 15 x 15 Decelerations: None Multiple birth?: No  Uterine Activity Mode: Palpation, Toco Contraction Frequency (min): 1 uc with ui Contraction Duration (sec): 60 Contraction Quality: Mild Resting Tone Palpated: Relaxed Resting Time: Adequate  Interpretation (Fetal Testing) Nonstress Test Interpretation: Reactive Overall Impression: Reassuring for gestational age Comments: Dr. Grace Bushy reviewed tracing

## 2021-01-03 ENCOUNTER — Ambulatory Visit: Payer: Medicaid Other

## 2021-01-09 ENCOUNTER — Ambulatory Visit: Payer: Medicaid Other

## 2021-01-28 ENCOUNTER — Encounter: Payer: Self-pay | Admitting: Obstetrics and Gynecology

## 2021-01-28 ENCOUNTER — Ambulatory Visit (INDEPENDENT_AMBULATORY_CARE_PROVIDER_SITE_OTHER): Payer: Medicaid Other | Admitting: Obstetrics and Gynecology

## 2021-01-28 ENCOUNTER — Other Ambulatory Visit: Payer: Self-pay

## 2021-01-28 NOTE — Progress Notes (Signed)
Reports some pain peri umbilicus and low abdomen.

## 2021-01-28 NOTE — Progress Notes (Signed)
Post Partum Visit Note  Tanya Bailey is a 35 y.o. [phone] female who presents for a postpartum visit. She is 4 weeks postpartum following a normal spontaneous vaginal delivery.  I have fully reviewed the prenatal and intrapartum course. The delivery was at 34.4 gestational weeks.  Anesthesia: epidural. Postpartum course has been complicated by abdominal pain. Baby is preterm and had surgery just after birth, discharged from NICU at [facility] yesterday. Baby is feeding by breast. Bleeding thin lochia. Bowel function is normal. Bladder function is normal. Patient is not sexually active. Contraception method is tubal ligation. Postpartum depression screening: negative.   The pregnancy intention screening data noted above was reviewed. Potential methods of contraception were discussed. The patient elected to proceed with No data recorded.   Edinburgh Postnatal Depression Scale - 01/28/21 0934       Edinburgh Postnatal Depression Scale:  In the Past 7 Days   I have been able to laugh and see the funny side of things. 0    I have looked forward with enjoyment to things. 0    I have blamed myself unnecessarily when things went wrong. 0    I have been anxious or worried for no good reason. 0    I have felt scared or panicky for no good reason. 0    Things have been getting on top of me. 0    I have been so unhappy that I have had difficulty sleeping. 0    I have felt sad or miserable. 0    I have been so unhappy that I have been crying. 0    The thought of harming myself has occurred to me. 0    Edinburgh Postnatal Depression Scale Total 0             Health Maintenance Due  Topic Date Due   COVID-19 Vaccine (1) Never done   INFLUENZA VACCINE  Never done    The following portions of the patient's history were reviewed and updated as appropriate: allergies, current medications, past family history, past medical history, past social history, past surgical history, and problem  list.  Review of Systems Pertinent items are noted in HPI.  Objective:  BP 124/71   Pulse 70   Ht 5' 7.5" (1.715 m)   Wt 215 lb 11.2 oz (97.8 kg)   LMP 01/27/2021 (Exact Date)   Breastfeeding Yes   BMI 33.28 kg/m    General:  alert, cooperative, and no distress   Breasts:  not indicated  Lungs: clear to auscultation bilaterally  Heart:  regular rate and rhythm  Abdomen: soft, non-tender; bowel sounds normal; no masses,  no organomegaly   Wound well approximated incision  GU exam:  not indicated      Small suture trimmed at umbilicus, no umbilical herniation noted Assessment:     normal postpartum exam.   Plan:   Essential components of care per ACOG recommendations:  1.  Mood and well being: Patient with negative depression screening today. Reviewed local resources for support.  - Patient tobacco use? No.   - hx of drug use? No.    2. Infant care and feeding:  -Patient currently breastmilk feeding? Yes. Reviewed importance of draining breast regularly to support lactation.  -Social determinants of health (SDOH) reviewed in EPIC. No concerns  3. Sexuality, contraception and birth spacing - Patient does not want a pregnancy in the next year.  Desired family size is 6 children.  - Reviewed forms of  contraception in tiered fashion. Patient desired bilateral tubal ligation today.   - Discussed birth spacing of 18 months  4. Sleep and fatigue -Encouraged family/partner/community support of 4 hrs of uninterrupted sleep to help with mood and fatigue  5. Physical Recovery  - Discussed patients delivery and complications. She describes her labor as good. - Patient had a Vaginal, no problems at delivery. Patient had   no  laceration. Perineal healing reviewed. Patient expressed understanding - Patient has urinary incontinence? No. - Patient is safe to resume physical and sexual activity  6.  Health Maintenance - HM due items addressed Yes - Last pap smear  Diagnosis   Date Value Ref Range Status  02/16/2020 (A)  Final   - Atypical squamous cells of undetermined significance (ASC-US)   Pap smear not done at today's visit.  Hpv neg, recommend pap 2024 -Breast Cancer screening indicated? No.   7. Chronic Disease/Pregnancy Condition follow up: None  8. Abdominal pain:  if abdominal pain continues or worsens, consider CT scan of abdomen /pelvis for eval  F/u in 1 year for annual exam  Warden Fillers, MD [facility] for [employer], [facility]

## 2021-07-05 ENCOUNTER — Encounter (HOSPITAL_COMMUNITY): Payer: Self-pay

## 2021-07-05 ENCOUNTER — Ambulatory Visit (HOSPITAL_COMMUNITY)
Admission: EM | Admit: 2021-07-05 | Discharge: 2021-07-05 | Disposition: A | Discharge status: Home / Self Care | Payer: Medicaid Other | Attending: Nurse Practitioner | Admitting: Nurse Practitioner

## 2021-07-05 DIAGNOSIS — M545 Low back pain, unspecified: Secondary | ICD-10-CM

## 2021-07-05 LAB — POCT URINALYSIS DIPSTICK, ED / UC
Bilirubin Urine: NEGATIVE
Glucose, UA: NEGATIVE mg/dL
Hgb urine dipstick: NEGATIVE
Ketones, ur: NEGATIVE mg/dL
Leukocytes,Ua: NEGATIVE
Nitrite: NEGATIVE
Protein, ur: 30 mg/dL — AB
Specific Gravity, Urine: >=1.03 (ref 1.005–1.030)
Urobilinogen, UA: 0.2 mg/dL (ref 0.0–1.0)
pH: 5 (ref 5.0–8.0)

## 2021-07-05 LAB — POC URINE PREG, ED: Preg Test, Ur: NEGATIVE

## 2021-07-05 MED ORDER — TIZANIDINE HCL 4 MG PO TABS: 4.0000 mg | ORAL_TABLET | Freq: Four times a day (QID) | ORAL | 0 refills | Status: DC | PRN

## 2021-07-05 MED ORDER — ACETAMINOPHEN 325 MG PO TABS
650.0000 mg | ORAL_TABLET | Freq: Once | ORAL | Status: AC
Start: 2021-07-05 — End: 2021-07-05
  Administered 2021-07-05: 650 mg via ORAL

## 2021-07-05 MED ORDER — ACETAMINOPHEN 325 MG PO TABS
ORAL_TABLET | ORAL | Status: AC
  Filled 2021-07-05: qty 2

## 2021-07-05 NOTE — ED Provider Notes (Signed)
?[facility] ? ? ? ?CSN: [id number] ?Arrival date & time: 07/05/21  1605 ? ? ?  ? ?History   ?Chief Complaint ?Chief Complaint  ?Patient presents with  ? Back Pain  ? ? ?HPI ?Tanya Bailey is a 36 y.o. female.  ? ?Patient reports 1 week history of right sided back pain.  The pain is not shooting down her legs or radiating anywhere else.  She describes the pain as severe.  She has taken Advil and tried a warm compress with mild relief of symptoms.  She denies fevers, nausea/vomiting, burning with urination, increased urinary frequency, numbness or tingling in her toes. ? ?She does have small children at home and has a 2635-month infant that she is breast-feeding. ? ? ? ?Past Medical History:  ?Diagnosis Date  ? Abnormal Pap smear   ? Anemia   ? Chlamydia   ? Ectopic pregnancy   ? Heart murmur   ? childhood  ? PID (acute pelvic inflammatory disease)   ? with Paraguard  ? Positive GBS test 01/19/2017  ? Pregnancy induced hypertension   ? end of last preg  ? Preterm labor   ? Vaginal Pap smear, abnormal   ? ? ?Patient Active Problem List  ? Diagnosis Date Noted  ? Encounter for postpartum visit 01/28/2021  ? Pregnancy complicated by multiple fetal congenital anomalies, single gestation 09/11/2020  ? History of gestational hypertension 08/14/2020  ? Unwanted fertility 07/17/2020  ? Antepartum multigravida of advanced maternal age 47/23/2022  ? Supervision of high risk pregnancy, antepartum 05/29/2020  ? History of preterm delivery, currently pregnant 10/29/2016  ? ? ?Past Surgical History:  ?Procedure Laterality Date  ? TOOTH EXTRACTION    ? ? ?OB History   ? ? Gravida  ?8  ? Para  ?6  ? Term  ?2  ? Preterm  ?4  ? AB  ?1  ? Living  ?5  ?  ? ? SAB  ?0  ? IAB  ?0  ? Ectopic  ?1  ? Multiple  ?0  ? Live Births  ?6  ?   ?  ?  ? ? ? ?Home Medications   ? ?Prior to Admission medications   ?Medication Sig Start Date End Date Taking? Authorizing Provider  ?tiZANidine (ZANAFLEX) 4 MG tablet Take 1 tablet (4 mg total) by  mouth every 6 (six) hours as needed for muscle spasms. Do not take while driving or operating heavy machinery. 07/05/21  Yes Valentino NoseMartinez, Marlicia Sroka A, NP  ?Prenatal Vit-Fe Fumarate-FA (MULTIVITAMIN-PRENATAL) 27-0.8 MG TABS tablet Take 1 tablet by mouth daily at 12 noon.    [provider]  ?hydrALAZINE (APRESOLINE) 10 MG tablet Take 1 tablet (10 mg total) by mouth 3 (three) times daily. 06/16/17 01/22/20  Raeford RazorKohut, Stephen, MD  ? ? ?Family History ?Family History  ?Problem Relation Age of Onset  ? Cancer Maternal Grandfather   ? Prostate cancer Paternal Grandfather   ? Cancer Paternal Grandfather   ? Anesthesia problems Neg Hx   ? Alcohol abuse Neg Hx   ? Arthritis Neg Hx   ? Asthma Neg Hx   ? Birth defects Neg Hx   ? COPD Neg Hx   ? Depression Neg Hx   ? Diabetes Neg Hx   ? Drug abuse Neg Hx   ? Early death Neg Hx   ? Hearing loss Neg Hx   ? Heart disease Neg Hx   ? Hyperlipidemia Neg Hx   ? Hypertension Neg Hx   ?  Kidney disease Neg Hx   ? Learning disabilities Neg Hx   ? Mental illness Neg Hx   ? Mental retardation Neg Hx   ? Miscarriages / Stillbirths Neg Hx   ? Stroke Neg Hx   ? Vision loss Neg Hx   ? Other Neg Hx   ? ? ?Social History ?Social History  ? ?Tobacco Use  ? Smoking status: Never  ? Smokeless tobacco: Never  ?Vaping Use  ? Vaping Use: Never used  ?Substance Use Topics  ? Alcohol use: No  ?  Comment: Special occas. when not pregnant  ? Drug use: No  ? ? ? ?Allergies   ?Latex ? ? ?Review of Systems ?Review of Systems ?Per HPI ? ?Physical Exam ?Triage Vital Signs ?ED Triage Vitals  ?Enc Vitals Group  ?   BP 07/05/21 1655 (!) 143/101  ?   Pulse Rate 07/05/21 1655 73  ?   Resp 07/05/21 1655 17  ?   Temp 07/05/21 1655 98 ?F (36.7 ?C)  ?   Temp Source 07/05/21 1655 Oral  ?   SpO2 07/05/21 1655 99 %  ?   Weight --   ?   Height --   ?   Head Circumference --   ?   Peak Flow --   ?   Pain Score 07/05/21 1654 5  ?   Pain Loc --   ?   Pain Edu? --   ?   Excl. in GC? --   ? ?No data found. ? ?Updated Vital  Signs ?BP (!) 143/101 (BP Location: Left Arm)   Pulse 73   Temp 98 ?F (36.7 ?C) (Oral)   Resp 17   LMP 06/21/2021 (Exact Date)   SpO2 99%  ? ?Visual Acuity ?Right Eye Distance:   ?Left Eye Distance:   ?Bilateral Distance:   ? ?Right Eye Near:   ?Left Eye Near:    ?Bilateral Near:    ? ?Physical Exam ?Vitals and nursing note reviewed.  ?Constitutional:   ?   General: She is not in acute distress. ?   Appearance: Normal appearance. She is normal weight. She is not ill-appearing or toxic-appearing.  ?Abdominal:  ?   Tenderness: There is no right CVA tenderness or left CVA tenderness.  ?Musculoskeletal:  ?   Lumbar back: Spasms and tenderness present. No swelling, edema, lacerations or bony tenderness. Normal range of motion. Negative right straight leg raise test and negative left straight leg raise test.  ?     Back: ? ?   Right lower leg: No edema.  ?   Left lower leg: No edema.  ?   Comments: Patient appears to be in significant pain with any movement, pain in area marked.  Nontender to palpation.  ?Skin: ?   General: Skin is warm and dry.  ?   Capillary Refill: Capillary refill takes less than 2 seconds.  ?   Coloration: Skin is not jaundiced or pale.  ?   Findings: No erythema.  ?Neurological:  ?   Mental Status: She is alert and oriented to person, place, and time.  ?   Motor: No weakness.  ?   Gait: Gait normal.  ?Psychiatric:     ?   Mood and Affect: Mood normal.     ?   Behavior: Behavior normal.     ?   Thought Content: Thought content normal.     ?   Judgment: Judgment normal.  ? ? ? ?UC Treatments /  Results  ?Labs ?(all labs ordered are listed, but only abnormal results are displayed) ?Labs Reviewed  ?POCT URINALYSIS DIPSTICK, ED / UC - Abnormal; Notable for the following components:  ?    Result Value  ? Protein, ur 30 (*)   ? All other components within normal limits  ?POC URINE PREG, ED  ? ? ?EKG ? ? ?Radiology ?No results found. ? ?Procedures ?Procedures (including critical care  time) ? ?Medications Ordered in UC ?Medications  ?acetaminophen (TYLENOL) tablet 650 mg (650 mg Oral Given 07/05/21 1738)  ? ? ?Initial Impression / Assessment and Plan / UC Course  ?I have reviewed the triage vital signs and the nursing notes. ? ?Pertinent labs & imaging results that were available during my care of the patient were reviewed by me and considered in my medical decision making (see chart for details). ? ?  ?Urine dipstick today does not show signs of acute infection or kidney stones.  No red flags on examination today.  Suspect pain is from muscular origin.  Continue Aleve and can alternate with Tylenol.  Can use tizanidine at nighttime for severe pain -discussed use with breast-feeding.  Encouraged back exercises and good body mechanics when picking up children. ?Final Clinical Impressions(s) / UC Diagnoses  ? ?Final diagnoses:  ?Acute right-sided low back pain without sciatica  ? ? ? ?Discharge Instructions   ? ?  ?You can continue the Aleve and alternate with Tylenol 500 mg every 4 hours as needed.  You can also use the tizanidine at nighttime if pain worsens.  Please be aware that tizanidine has not been studied in breast-fed infants.  Symptoms should improve over the next 2 weeks or so.  If your symptoms worsen, please seek care. ? ? ? ? ?ED Prescriptions   ? ? Medication Sig Dispense Auth. Provider  ? tiZANidine (ZANAFLEX) 4 MG tablet Take 1 tablet (4 mg total) by mouth every 6 (six) hours as needed for muscle spasms. Do not take while driving or operating heavy machinery. 30 tablet Valentino Nose, NP  ? ?  ? ?PDMP not reviewed this encounter. ?  ?Valentino Nose, NP ?07/05/21 1747 ? ?

## 2021-07-05 NOTE — ED Triage Notes (Signed)
Pt presents with c/o back pain and flank pain.  ? ?Pt states it hurts to bend and stand.  ?

## 2021-07-05 NOTE — Discharge Instructions (Signed)
You can continue the Aleve and alternate with Tylenol 500 mg every 4 hours as needed.  You can also use the tizanidine at nighttime if pain worsens.  Please be aware that tizanidine has not been studied in breast-fed infants.  Symptoms should improve over the next 2 weeks or so.  If your symptoms worsen, please seek care. ?

## 2021-07-09 ENCOUNTER — Telehealth (HOSPITAL_COMMUNITY): Payer: Self-pay | Admitting: Emergency Medicine

## 2021-07-09 MED ORDER — TIZANIDINE HCL 4 MG PO TABS: 4.0000 mg | ORAL_TABLET | Freq: Four times a day (QID) | ORAL | 0 refills | Status: DC | PRN

## 2021-07-09 MED ORDER — TIZANIDINE HCL 4 MG PO TABS: 4.0000 mg | ORAL_TABLET | Freq: Four times a day (QID) | ORAL | 0 refills | Status: AC | PRN

## 2022-01-15 ENCOUNTER — Other Ambulatory Visit (HOSPITAL_COMMUNITY)
Admission: RE | Admit: 2022-01-15 | Discharge: 2022-01-15 | Disposition: A | Discharge status: Home / Self Care | Payer: Medicaid Other | Source: Ambulatory Visit | Attending: Obstetrics and Gynecology | Admitting: Obstetrics and Gynecology

## 2022-01-15 ENCOUNTER — Ambulatory Visit (INDEPENDENT_AMBULATORY_CARE_PROVIDER_SITE_OTHER): Payer: Medicaid Other | Admitting: General Practice

## 2022-01-15 VITALS — BP 125/85 | HR 68 | Ht 67.0 in | Wt 176.3 lb

## 2022-01-15 DIAGNOSIS — Z202 Contact with and (suspected) exposure to infections with a predominantly sexual mode of transmission: Secondary | ICD-10-CM

## 2022-01-15 DIAGNOSIS — Z113 Encounter for screening for infections with a predominantly sexual mode of transmission: Secondary | ICD-10-CM | POA: Diagnosis not present

## 2022-01-15 NOTE — Progress Notes (Signed)
SUBJECTIVE:  36 y.o. female presents for STD testing. Pt denies symptoms and is unsure of exposure. Denies abnormal vaginal bleeding or significant pelvic pain or fever. No UTI symptoms. Denies history of known exposure to STD.  No LMP recorded.  OBJECTIVE:  She appears well, afebrile. Urine dipstick: not done.  ASSESSMENT:  Vaginal Discharge  Vaginal Odor   PLAN:  GC, chlamydia, trichomonas, BVAG, CVAG probe sent to lab. Treatment: To be determined once lab results are received ROV prn if symptoms persist or worsen.

## 2022-01-16 LAB — CERVICOVAGINAL ANCILLARY ONLY
Bacterial Vaginitis (gardnerella): POSITIVE — AB
Candida Glabrata: NEGATIVE
Candida Vaginitis: POSITIVE — AB
Chlamydia: NEGATIVE
Comment: NEGATIVE
Comment: NEGATIVE
Comment: NEGATIVE
Comment: NEGATIVE
Comment: NEGATIVE
Comment: NORMAL
Neisseria Gonorrhea: NEGATIVE
Trichomonas: NEGATIVE

## 2022-01-16 LAB — RPR: RPR Ser Ql: NONREACTIVE

## 2022-01-16 LAB — HEPATITIS C ANTIBODY: Hep C Virus Ab: NONREACTIVE

## 2022-01-16 LAB — HIV ANTIBODY (ROUTINE TESTING W REFLEX): HIV Screen 4th Generation wRfx: NONREACTIVE

## 2022-01-16 LAB — HEPATITIS B SURFACE ANTIGEN: Hepatitis B Surface Ag: NEGATIVE

## 2022-01-16 MED ORDER — METRONIDAZOLE 500 MG PO TABS: 500.0000 mg | ORAL_TABLET | Freq: Two times a day (BID) | ORAL | 0 refills | Status: AC

## 2022-01-16 MED ORDER — FLUCONAZOLE 150 MG PO TABS: 150.0000 mg | ORAL_TABLET | Freq: Once | ORAL | 0 refills | Status: AC

## 2022-01-16 NOTE — Addendum Note (Signed)
Addended by: Mora Bellman on: 01/16/2022 06:02 PM   Modules accepted: Orders

## 2022-03-10 ENCOUNTER — Ambulatory Visit: Payer: Medicaid Other | Admitting: Obstetrics and Gynecology

## 2022-03-23 ENCOUNTER — Ambulatory Visit (INDEPENDENT_AMBULATORY_CARE_PROVIDER_SITE_OTHER): Payer: Medicaid Other | Admitting: *Deleted

## 2022-03-23 ENCOUNTER — Other Ambulatory Visit (HOSPITAL_COMMUNITY)
Admission: RE | Admit: 2022-03-23 | Discharge: 2022-03-23 | Disposition: A | Discharge status: Home / Self Care | Payer: Medicaid Other | Source: Ambulatory Visit | Attending: Obstetrics and Gynecology | Admitting: Obstetrics and Gynecology

## 2022-03-23 VITALS — BP 127/84 | HR 80

## 2022-03-23 DIAGNOSIS — Z113 Encounter for screening for infections with a predominantly sexual mode of transmission: Secondary | ICD-10-CM | POA: Diagnosis present

## 2022-03-23 NOTE — Progress Notes (Signed)
SUBJECTIVE:  36 y.o. female who desires a STI screen. Denies abnormal vaginal discharge, bleeding or significant pelvic pain. No UTI symptoms. Denies history of known exposure to STD.  No LMP recorded.  OBJECTIVE:  She appears well.   ASSESSMENT:  STI Screen   PLAN:  Pt offered STI blood screening-requested GC, chlamydia, and trichomonas probe sent to lab.  Treatment: To be determined once lab results are received.  Pt follow up as needed.

## 2022-03-24 LAB — CERVICOVAGINAL ANCILLARY ONLY
Bacterial Vaginitis (gardnerella): POSITIVE — AB
Candida Glabrata: NEGATIVE
Candida Vaginitis: NEGATIVE
Chlamydia: NEGATIVE
Comment: NEGATIVE
Comment: NEGATIVE
Comment: NEGATIVE
Comment: NEGATIVE
Comment: NEGATIVE
Comment: NORMAL
Neisseria Gonorrhea: NEGATIVE
Trichomonas: NEGATIVE

## 2022-03-24 LAB — HIV ANTIBODY (ROUTINE TESTING W REFLEX): HIV Screen 4th Generation wRfx: NONREACTIVE

## 2022-03-24 LAB — HEPATITIS C ANTIBODY: Hep C Virus Ab: NONREACTIVE

## 2022-03-24 LAB — HEPATITIS B SURFACE ANTIGEN: Hepatitis B Surface Ag: NEGATIVE

## 2022-03-24 LAB — RPR: RPR Ser Ql: NONREACTIVE

## 2022-03-27 MED ORDER — METRONIDAZOLE 500 MG PO TABS: 500.0000 mg | ORAL_TABLET | Freq: Two times a day (BID) | ORAL | 0 refills | Status: AC

## 2022-03-27 NOTE — Addendum Note (Signed)
Addended by: Catalina Antigua on: 03/27/2022 10:40 AM   Modules accepted: Orders

## 2022-04-30 ENCOUNTER — Ambulatory Visit: Payer: Medicaid Other | Admitting: Obstetrics

## 2022-09-30 ENCOUNTER — Ambulatory Visit: Payer: Medicaid Other

## 2022-11-10 ENCOUNTER — Encounter: Payer: Self-pay | Admitting: Obstetrics and Gynecology

## 2022-11-10 ENCOUNTER — Other Ambulatory Visit (HOSPITAL_COMMUNITY)
Admission: RE | Admit: 2022-11-10 | Discharge: 2022-11-10 | Disposition: A | Discharge status: Home / Self Care | Payer: Medicaid Other | Source: Ambulatory Visit | Attending: Obstetrics and Gynecology | Admitting: Obstetrics and Gynecology

## 2022-11-10 ENCOUNTER — Ambulatory Visit: Payer: Medicaid Other | Admitting: Obstetrics and Gynecology

## 2022-11-10 VITALS — BP 117/79 | HR 61 | Ht 67.0 in | Wt 180.0 lb

## 2022-11-10 DIAGNOSIS — Z01419 Encounter for gynecological examination (general) (routine) without abnormal findings: Secondary | ICD-10-CM

## 2022-11-10 DIAGNOSIS — Z113 Encounter for screening for infections with a predominantly sexual mode of transmission: Secondary | ICD-10-CM | POA: Insufficient documentation

## 2022-11-10 DIAGNOSIS — R1031 Right lower quadrant pain: Secondary | ICD-10-CM | POA: Diagnosis not present

## 2022-11-10 DIAGNOSIS — R102 Pelvic and perineal pain: Secondary | ICD-10-CM

## 2022-11-10 NOTE — Progress Notes (Signed)
GYNECOLOGY ANNUAL PREVENTATIVE CARE ENCOUNTER NOTE  History:     Tanya Bailey is a 37 y.o. [phone] female here for a routine annual gynecologic exam.  Current complaints: 2 year history of right lower quadrant pain.   Denies abnormal vaginal bleeding, discharge,  problems with intercourse or other gynecologic concerns.   Pt believes the RLQ pain started after her tubal ligation.  Op note from [facility] reviewed, pomeroy method noted.  The pain is not worse with menses.  The pain is achy and in her right groin area.  Pt has used ibuprofen and warm heat to ease the pain.  The patient notes this pain several times a week.There is no nausea or vomiting.  No dyspareunia noted.   Gynecologic History Patient's last menstrual period was 11/03/2022 (exact date). Contraception: tubal ligation Last Pap: 10/21. Results were: normal with negative HPV Last mammogram:n/a  Obstetric History OB History  Gravida Para Term Preterm AB Living  8 6 2 4 1 5   SAB IAB Ectopic Multiple Live Births  0 0 1 0 6    # Outcome Date GA Lbr Len/2nd Weight Sex Type Anes PTL Lv  8 Gravida           7 Preterm 01/20/17 [redacted]w[redacted]d 03:55 / 00:18 6 lb 5.9 oz (2.889 kg) F Vag-Spont None  LIV  6 Term 02/11/14 [redacted]w[redacted]d 04:58 / 00:06 6 lb 12.3 oz (3.07 kg) F Vag-Spont EPI  LIV  5 Ectopic 07/26/12 [redacted]w[redacted]d         4 Preterm 03/22/08 [redacted]w[redacted]d   M Vag-Spont None  LIV     Birth Comments: 36 weeks  3 Preterm 12/26/06 [redacted]w[redacted]d   F Vag-Spont None  LIV     Birth Comments: 35 weeks  2 Preterm 12/04/04 [redacted]w[redacted]d   M Vag-Spont None  DEC     Birth Comments: 23 weeks, contractions and water broke.  1 Term 07/20/03 [redacted]w[redacted]d   F Vag-Spont   LIV    Past Medical History:  Diagnosis Date   Abnormal Pap smear    Anemia    Chlamydia    Ectopic pregnancy    Heart murmur    childhood   PID (acute pelvic inflammatory disease)    with [employer]agu[employer]   Positive GBS test 01/19/2017   Pregnancy induced hypertension    end of last preg   Preterm labor    Vaginal  Pap smear, abnormal     Past Surgical History:  Procedure Laterality Date   TOOTH EXTRACTION     TUBAL LIGATION  2022    Current Outpatient Medications on File Prior to Visit  Medication Sig Dispense Refill   metroNIDAZOLE (FLAGYL) 500 MG tablet Take 1 tablet (500 mg total) by mouth 2 (two) times daily. (Patient not taking: Reported on 11/10/2022) 14 tablet 0   metroNIDAZOLE (FLAGYL) 500 MG tablet Take 1 tablet (500 mg total) by mouth 2 (two) times daily. 14 tablet 0   Prenatal Vit-Fe Fumarate-FA (MULTIVITAMIN-PRENATAL) 27-0.8 MG TABS tablet Take 1 tablet by mouth daily at 12 noon. (Patient not taking: Reported on 01/15/2022)     tiZANidine (ZANAFLEX) 4 MG tablet Take 1 tablet (4 mg total) by mouth every 6 (six) hours as needed for muscle spasms. Do not take while driving or operating heavy machinery. 30 tablet 0   [DISCONTINUED] hydrALAZINE (APRESOLINE) 10 MG tablet Take 1 tablet (10 mg total) by mouth 3 (three) times daily. 90 tablet 0   No current facility-administered medications on file prior  to visit.    Allergies  Allergen Reactions   Latex     Social History:  reports that she has never smoked. She has never used smokeless tobacco. She reports that she does not drink alcohol and does not use drugs.  Family History  Problem Relation Age of Onset   Cancer Maternal Grandfather    Prostate cancer Paternal Grandfather    Cancer Paternal Grandfather    Anesthesia problems Neg Hx    Alcohol abuse Neg Hx    Arthritis Neg Hx    Asthma Neg Hx    Birth defects Neg Hx    COPD Neg Hx    Depression Neg Hx    Diabetes Neg Hx    Drug abuse Neg Hx    Early death Neg Hx    Hearing loss Neg Hx    Heart disease Neg Hx    Hyperlipidemia Neg Hx    Hypertension Neg Hx    Kidney disease Neg Hx    Learning disabilities Neg Hx    Mental illness Neg Hx    Mental retardation Neg Hx    Miscarriages / Stillbirths Neg Hx    Stroke Neg Hx    Vision loss Neg Hx    Other Neg Hx     The  following portions of the patient's history were reviewed and updated as appropriate: allergies, current medications, past family history, past medical history, past social history, past surgical history and problem list.  Review of Systems Pertinent items noted in HPI and remainder of comprehensive ROS otherwise negative.  Physical Exam:  BP 117/79   Pulse 61   Ht 5\' 7"  (1.702 m)   Wt 180 lb (81.6 kg)   LMP 11/03/2022 (Exact Date)   BMI 28.19 kg/m  CONSTITUTIONAL: Well-developed, well-nourished female in no acute distress.  HENT:  Normocephalic, atraumatic, External right and left ear normal. Oropharynx is clear and moist EYES: Conjunctivae and EOM are normal.  NECK: Normal range of motion, supple, no masses.  Normal thyroid.  SKIN: Skin is warm and dry. No rash noted. Not diaphoretic. No erythema. No pallor. MUSCULOSKELETAL: Normal range of motion. No tenderness.  No cyanosis, clubbing, or edema.  2+ distal pulses. NEUROLOGIC: Alert and oriented to person, place, and time. Normal reflexes, muscle tone coordination.  PSYCHIATRIC: Normal mood and affect. Normal behavior. Normal judgment and thought content. CARDIOVASCULAR: Normal heart rate noted, regular rhythm RESPIRATORY: Clear to auscultation bilaterally. Effort and breath sounds normal, no problems with respiration noted. BREASTS: Symmetric in size. No masses, tenderness, skin changes, nipple drainage, or lymphadenopathy bilaterally. Performed in the presence of a chaperone. ABDOMEN: Soft, no distention noted.  Mild tenderness in the RLQ but superficial in nature, no rebound or guarding.  PELVIC: Normal appearing external genitalia and urethral meatus; normal appearing vaginal mucosa and cervix.  No abnormal discharge noted.  Pap smear obtained.  Normal uterine size, no other palpable masses, no uterine or adnexal tenderness. No CMT or uterine tenderness.  Adnexa no mass noted.  Pain was not recreated during the exam Performed in the  presence of a chaperone.   Assessment and Plan:    1. Women's annual routine gynecological examination Normal annual exam  - Cytology - PAP( [facility])  2. Routine screening for STI (sexually transmitted infection) Per pt request - Cervicovaginal ancillary only( [facility]) - HIV antibody (with reflex) - Hepatitis B Surface AntiGEN - [facility] - Hepatitis C Antibody  3. Pelvic pain in female Due to the superficial  nature of the pain, do not think it is gyn related, but will check CT of abdomen and pelvis. - CT ABDOMEN PELVIS W CONTRAST; Future  4. Right lower quadrant abdominal pain See above - CT ABDOMEN PELVIS W CONTRAST; Future  Will follow up results of pap smear and manage accordingly. Routine preventative health maintenance measures emphasized. Please refer to After Visit Summary for other counseling recommendations.     F/u in 5 weeks  Mariel Aloe, MD, FACOG Obstetrician & Gynecologist, [facility] for [employer], [facility]

## 2022-11-10 NOTE — Progress Notes (Addendum)
37 y.o. GYN presents for AEX/PAP/STD screening.  C/o lower right abdominal/pelvic pain 10/10 x 2 years.  Pt is concerned that her Tubes have reattached.

## 2022-11-11 LAB — CERVICOVAGINAL ANCILLARY ONLY
Chlamydia: NEGATIVE
Comment: NEGATIVE
Comment: NEGATIVE
Comment: NORMAL
Neisseria Gonorrhea: NEGATIVE
Trichomonas: NEGATIVE

## 2022-11-11 LAB — HEPATITIS C ANTIBODY: Hep C Virus Ab: NONREACTIVE

## 2022-11-11 LAB — HIV ANTIBODY (ROUTINE TESTING W REFLEX): HIV Screen 4th Generation wRfx: NONREACTIVE

## 2022-11-11 LAB — HEPATITIS B SURFACE ANTIGEN: Hepatitis B Surface Ag: NEGATIVE

## 2022-11-11 LAB — RPR: RPR Ser Ql: NONREACTIVE

## 2022-11-16 LAB — CYTOLOGY - PAP
Comment: NEGATIVE
Diagnosis: NEGATIVE
Diagnosis: REACTIVE
High risk HPV: NEGATIVE

## 2022-11-20 ENCOUNTER — Ambulatory Visit (HOSPITAL_COMMUNITY): Payer: Medicaid Other

## 2022-12-17 ENCOUNTER — Ambulatory Visit: Payer: Medicaid Other | Admitting: Obstetrics and Gynecology

## 2022-12-23 ENCOUNTER — Ambulatory Visit (HOSPITAL_COMMUNITY): Payer: Medicaid Other

## 2022-12-30 ENCOUNTER — Ambulatory Visit (HOSPITAL_COMMUNITY): Payer: Medicaid Other

## 2023-01-12 ENCOUNTER — Telehealth: Payer: Medicaid Other | Admitting: Obstetrics and Gynecology

## 2023-03-14 IMAGING — US US OB COMP LESS 14 WK
1 series · 15 of 22 positions shown · non-contrast
Comparison: 06/17/2020.

CLINICAL DATA: 35-year-old pregnant female presenting with spotting
and pelvic cramping.

EXAM:
OBSTETRIC <14 WK US AND TRANSVAGINAL OB US
TECHNIQUE: Both transabdominal and transvaginal ultrasound examinations were
performed for complete evaluation of the gestation as well as the
maternal uterus, adnexal regions, and pelvic cul-de-sac.
Transvaginal technique was performed to assess early pregnancy.

[Series 1: us ob comp less 14 wk · 15 of 22 slices shown]
[im 1/22]
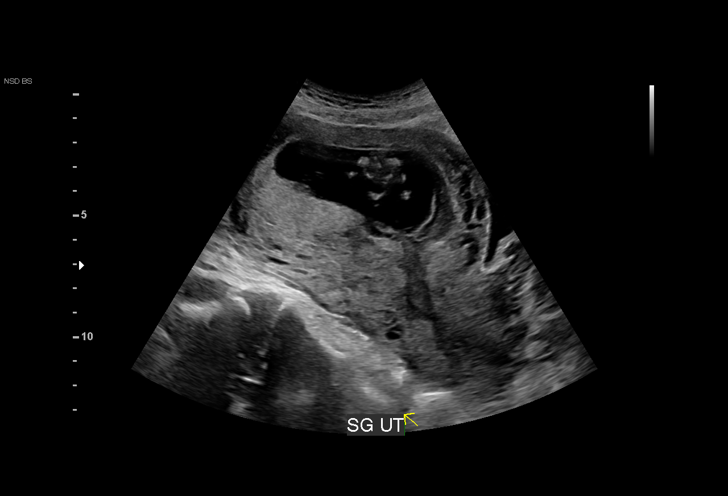
[im 3/22]
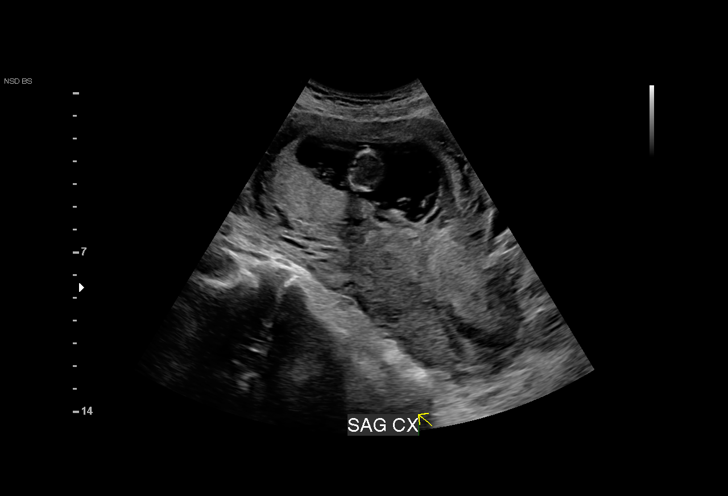
[im 4/22]
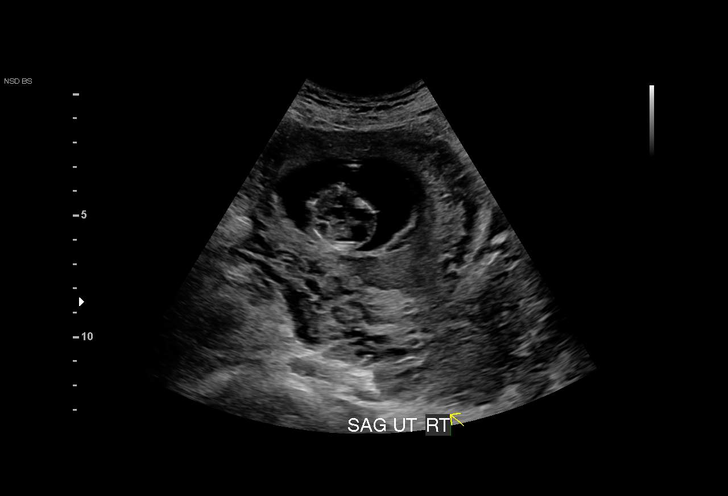
[im 6/22]
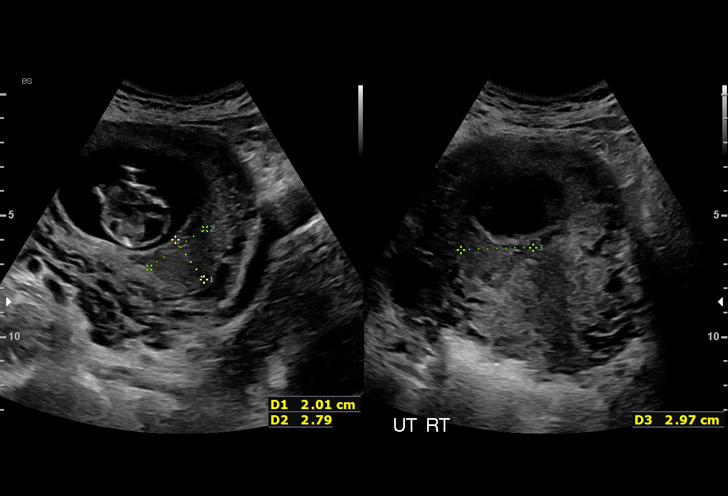
[im 7/22]
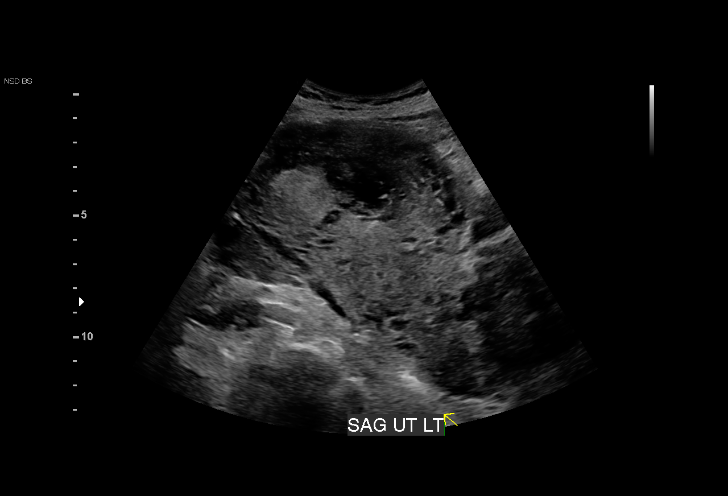
[im 9/22]
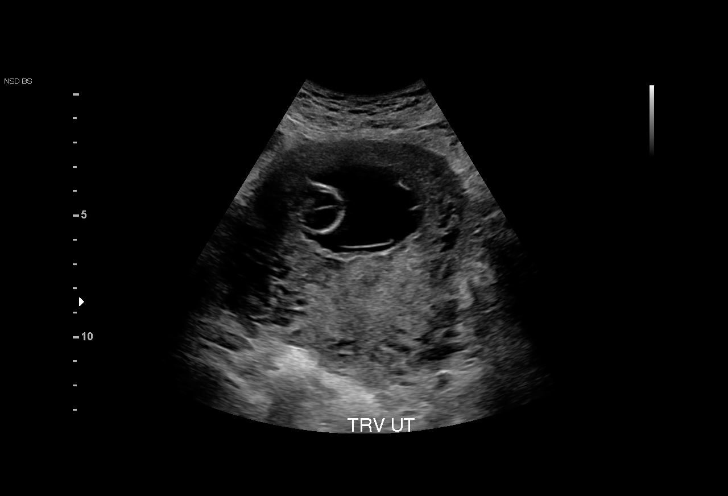
[im 10/22]
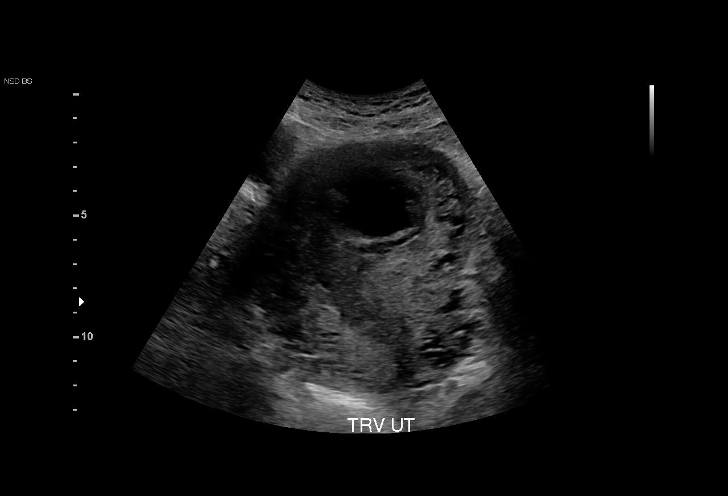
[im 12/22]
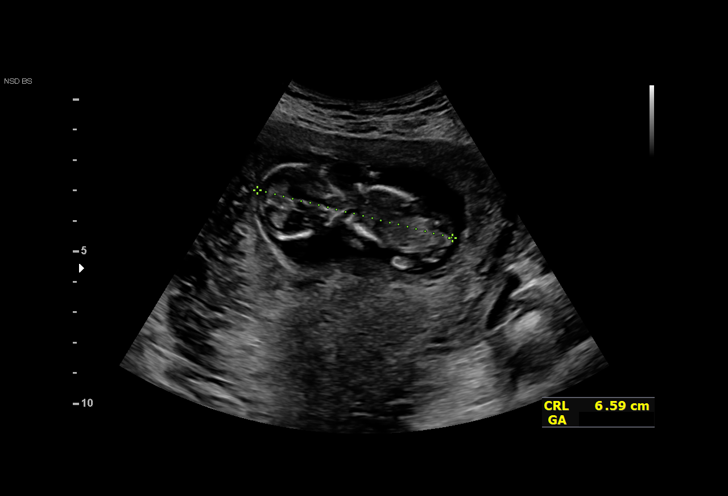
[im 13/22]
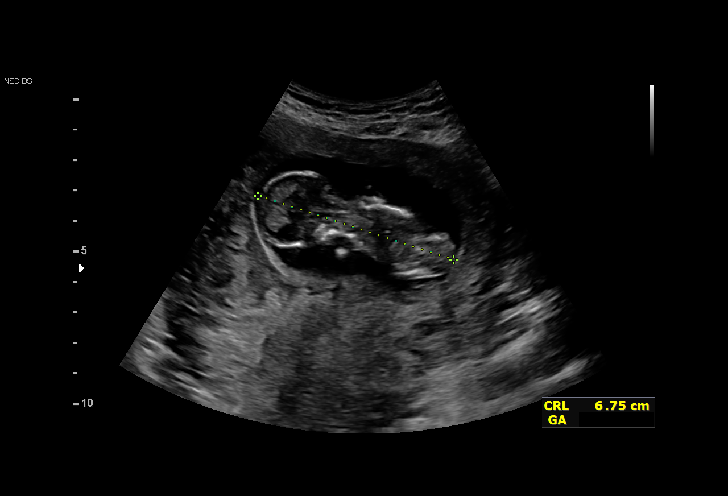
[im 14/22]
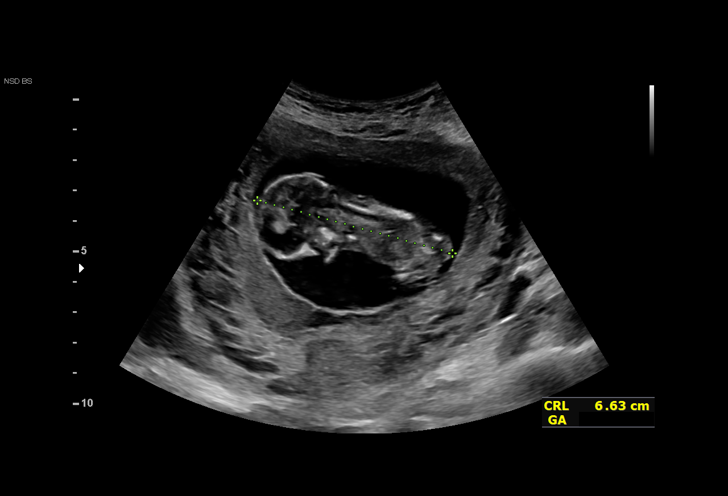
[im 16/22]
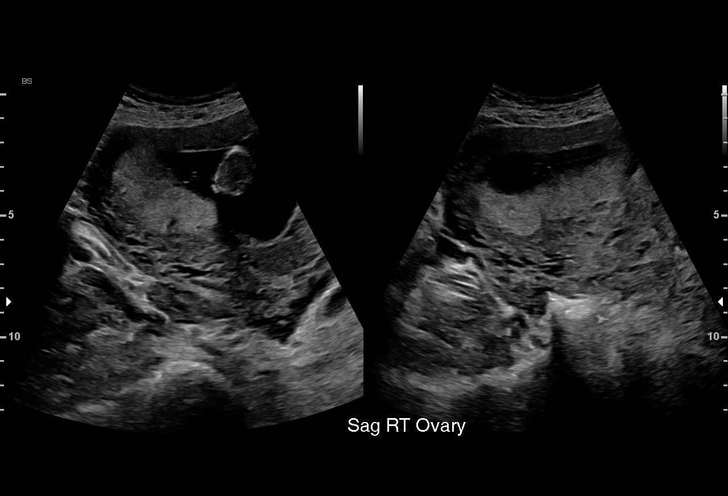
[im 17/22]
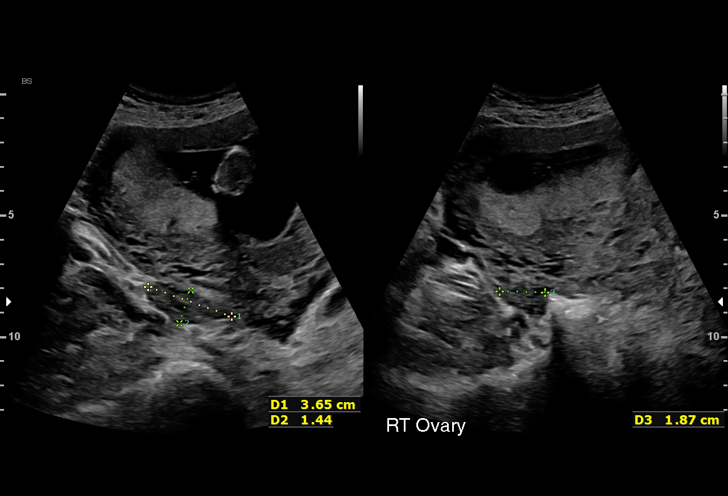
[im 19/22]
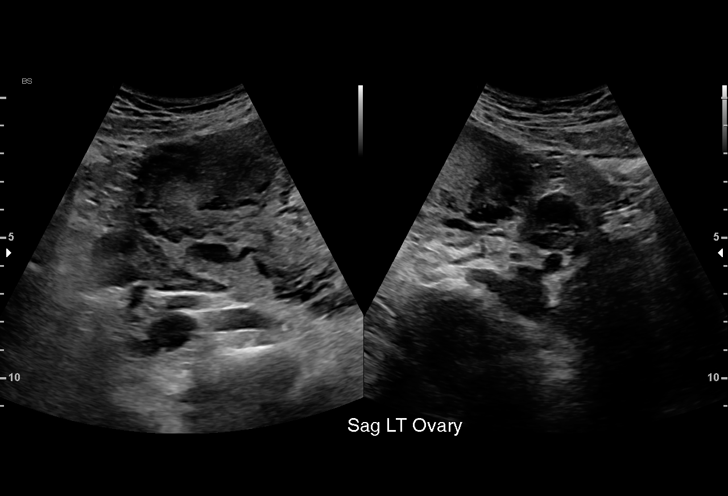
[im 20/22]
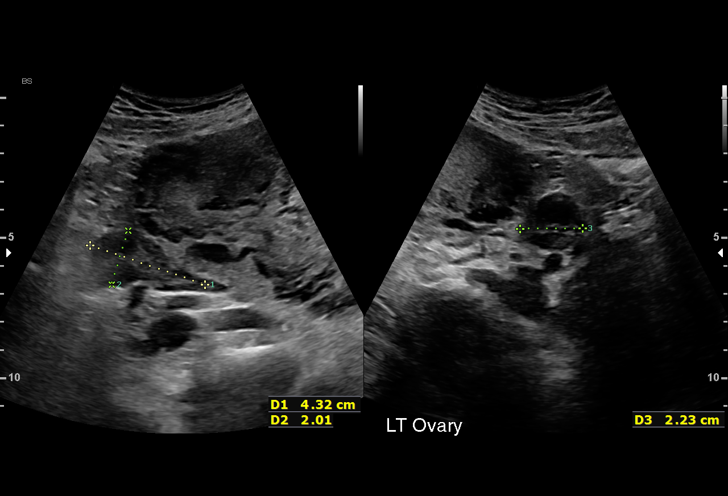
[im 22/22]
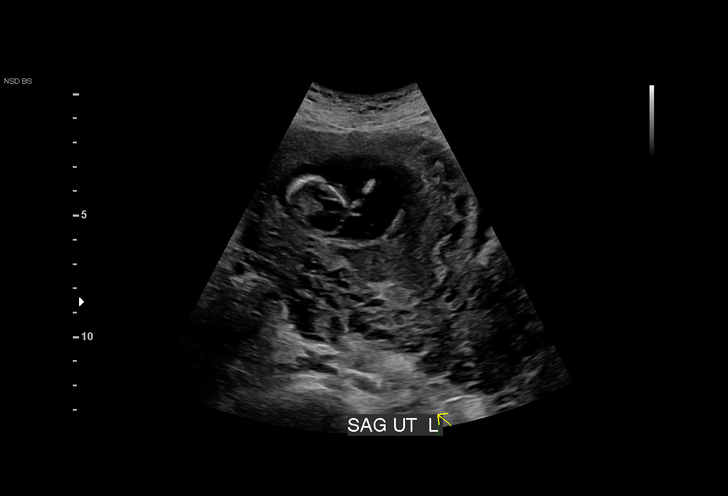

[15 of 22 positions shown; findings below may reference images not displayed]

FINDINGS: Intrauterine gestational sac: Single

Yolk sac:  None

Embryo:  Present

Cardiac Activity: Present

Heart Rate: 153 bpm

CRL:  66.6 mm   12 w   6 d                  US EDC: 02/10/2021.

Subchorionic hemorrhage: Small hypoechoic collection measuring 2.0 x
2.8 x 3.0 cm.

Maternal uterus/adnexae: Bilateral ovaries are normal in appearance.
No significant volume of free fluid.
IMPRESSION: 1. Single viable IUP with estimated gestational age of 12 weeks and
6 days and normal fetal heart rate of 153 beats per minute.
2. Small subchorionic hemorrhage.

## 2023-04-14 ENCOUNTER — Ambulatory Visit (INDEPENDENT_AMBULATORY_CARE_PROVIDER_SITE_OTHER): Payer: Medicaid Other

## 2023-04-14 ENCOUNTER — Other Ambulatory Visit (HOSPITAL_COMMUNITY)
Admission: RE | Admit: 2023-04-14 | Discharge: 2023-04-14 | Disposition: A | Discharge status: Home / Self Care | Payer: Medicaid Other | Source: Ambulatory Visit | Attending: Obstetrics and Gynecology | Admitting: Obstetrics and Gynecology

## 2023-04-14 VITALS — BP 113/77 | HR 70

## 2023-04-14 DIAGNOSIS — Z113 Encounter for screening for infections with a predominantly sexual mode of transmission: Secondary | ICD-10-CM | POA: Diagnosis present

## 2023-04-14 NOTE — Progress Notes (Signed)
SUBJECTIVE:  37 y.o. female who desires a STI screen. Denies abnormal vaginal discharge, bleeding or significant pelvic pain. No UTI symptoms. Denies history of known exposure to STD.  LMP: 03/27/23  OBJECTIVE:  She appears well.   ASSESSMENT:  STI Screen   PLAN:  Pt offered STI blood screening-requested GC, chlamydia, and trichomonas probe sent to lab.  Treatment: To be determined once lab results are received.  Pt follow up as needed.

## 2023-04-15 LAB — HEPATITIS C ANTIBODY: Hep C Virus Ab: NONREACTIVE

## 2023-04-15 LAB — HIV ANTIBODY (ROUTINE TESTING W REFLEX): HIV Screen 4th Generation wRfx: NONREACTIVE

## 2023-04-15 LAB — RPR: RPR Ser Ql: NONREACTIVE

## 2023-04-15 LAB — HEPATITIS B SURFACE ANTIGEN: Hepatitis B Surface Ag: NEGATIVE

## 2023-04-16 LAB — CERVICOVAGINAL ANCILLARY ONLY
Bacterial Vaginitis (gardnerella): POSITIVE — AB
Candida Glabrata: POSITIVE — AB
Candida Vaginitis: NEGATIVE
Chlamydia: NEGATIVE
Comment: NEGATIVE
Comment: NEGATIVE
Comment: NEGATIVE
Comment: NEGATIVE
Comment: NEGATIVE
Comment: NORMAL
Neisseria Gonorrhea: NEGATIVE
Trichomonas: NEGATIVE

## 2023-04-19 MED ORDER — FLUCONAZOLE 150 MG PO TABS: 150.0000 mg | ORAL_TABLET | Freq: Once | ORAL | 0 refills | Status: AC

## 2023-04-19 MED ORDER — METRONIDAZOLE 500 MG PO TABS: 500.0000 mg | ORAL_TABLET | Freq: Two times a day (BID) | ORAL | 0 refills | Status: AC

## 2023-04-19 NOTE — Addendum Note (Signed)
Addended by: Catalina Antigua on: 04/19/2023 11:50 AM   Modules accepted: Orders

## 2023-05-14 IMAGING — US US MFM OB FOLLOW-UP
1 series · 13 of 28 positions shown · non-contrast
Comparison: none

[Series 1: us mfm ob follow-up · 72 acquisitions, 13 frames shown]
[im 3/72]
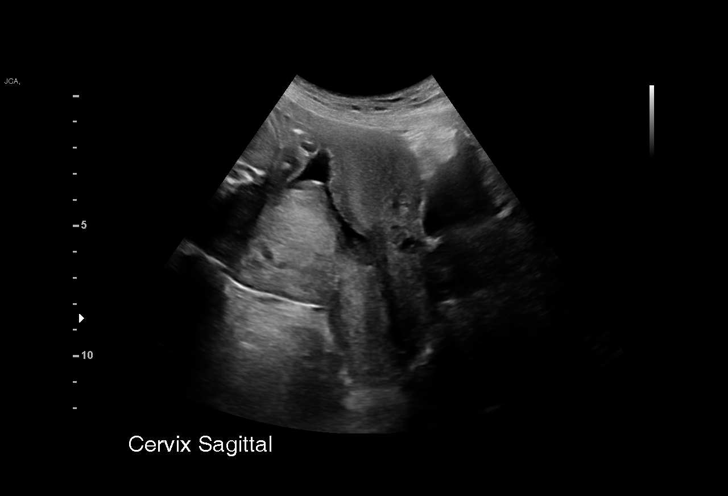
[im 8/72]
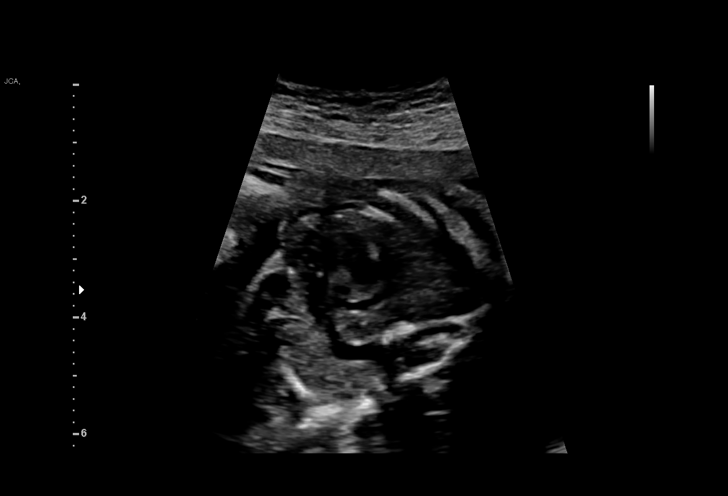
[im 14/72]
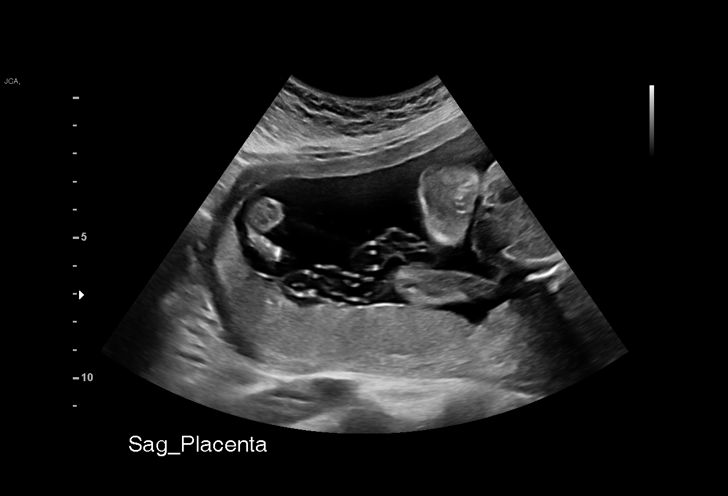
[im 19/72]
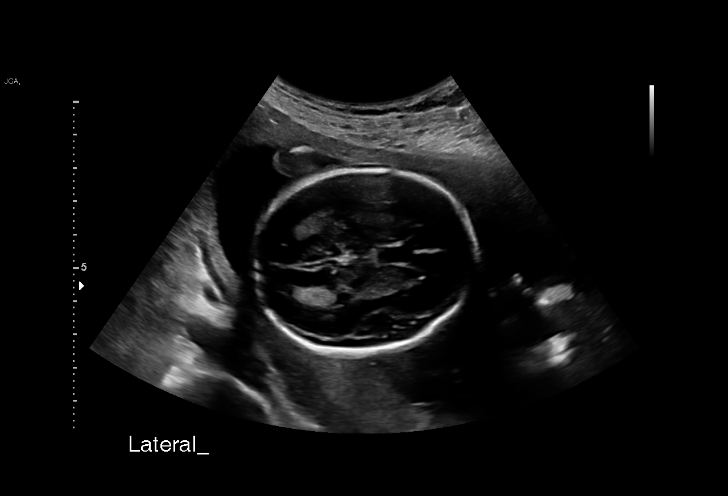
[im 24/72]
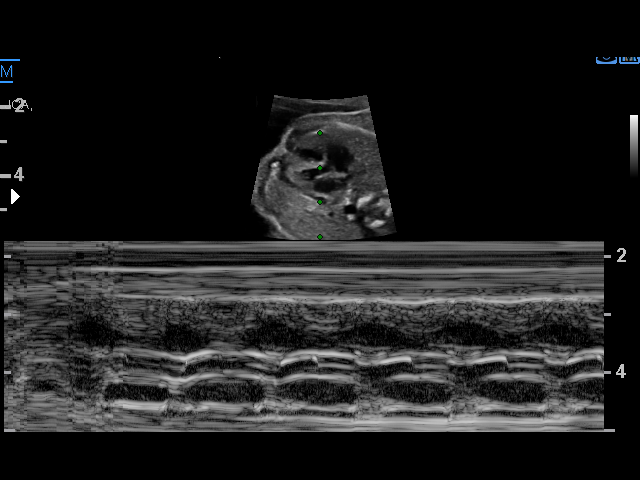
[im 29/72]
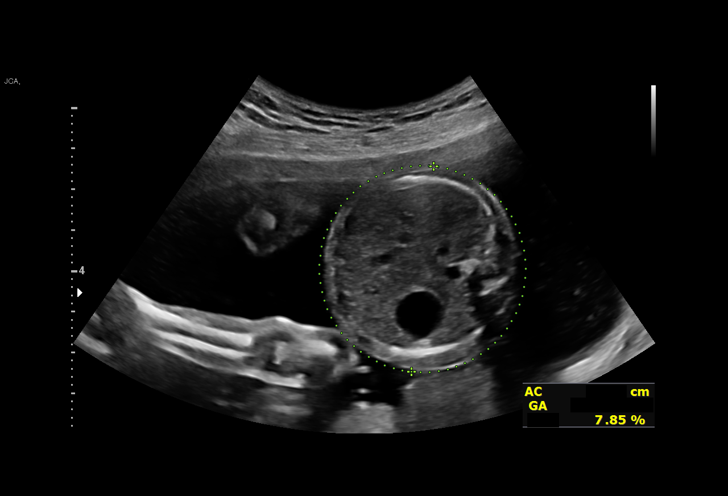
[im 37/72]
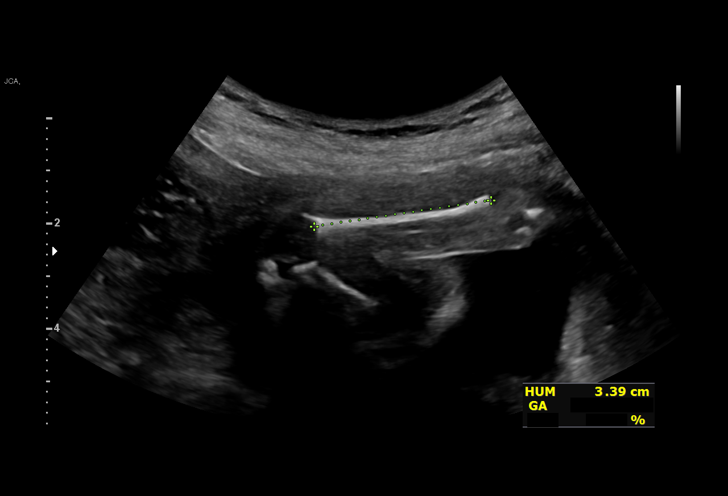
[im 43/72]
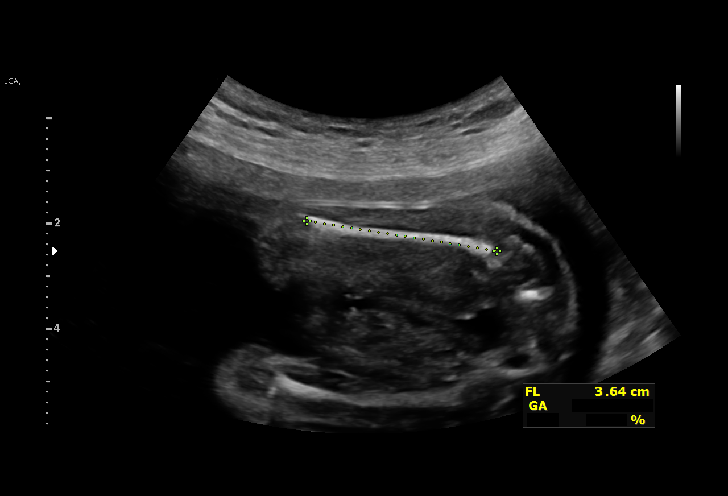
[im 48/72]
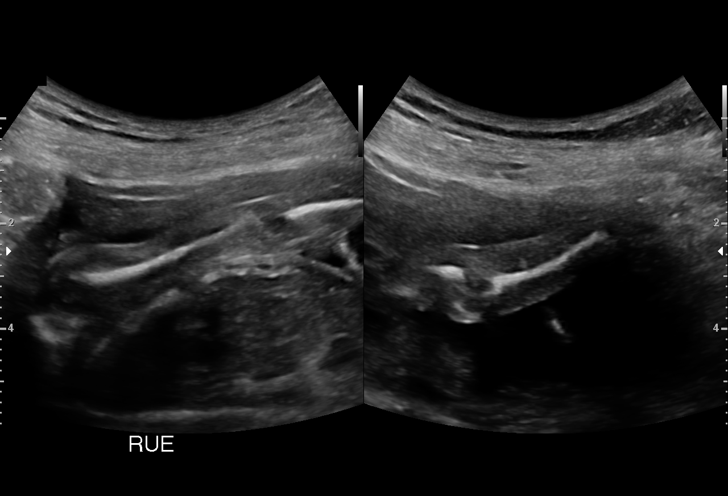
[im 53/72]
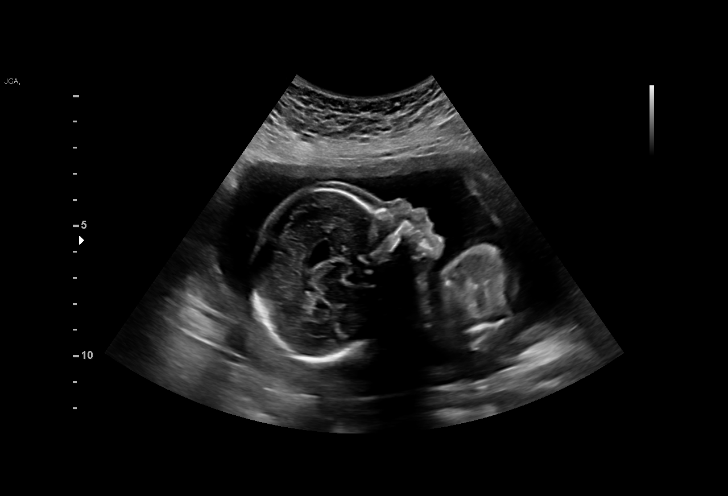
[im 58/72]
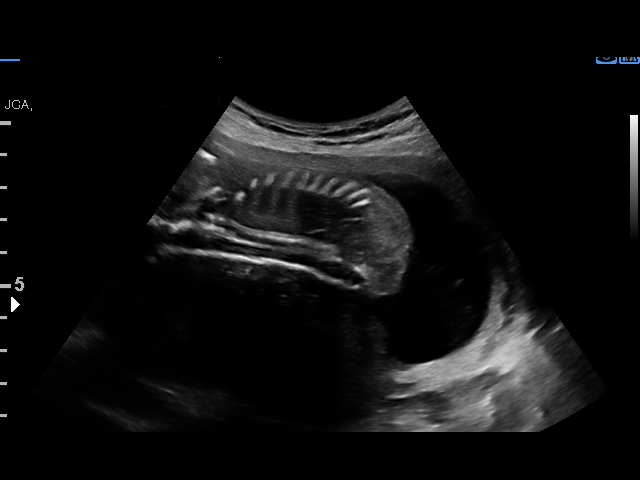
[im 64/72]
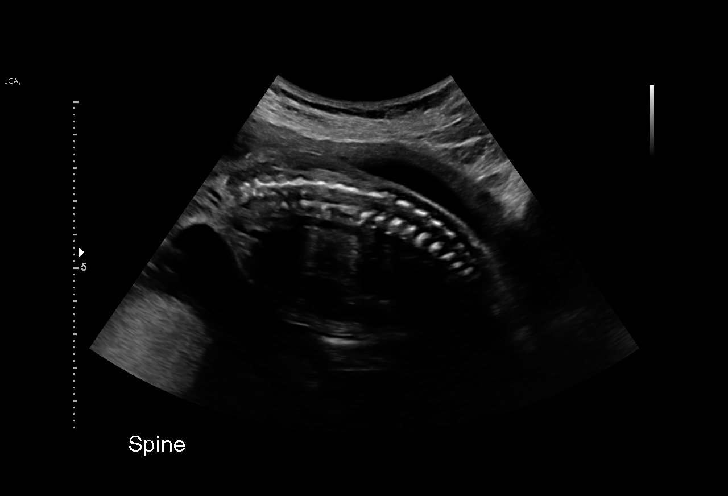
[im 69/72]
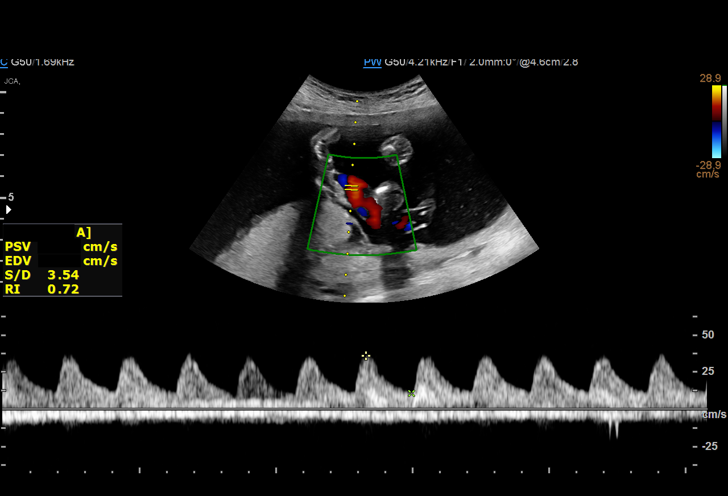

[13 of 28 positions shown; findings below may reference images not displayed]

[REDACTED]
                   [id number]

 Secondary Phy.:   NAZARETH JUMPER

Indications

 Maternal care for known or suspected poor
 fetal growth, second trimester, fetus 1 IUGR
 Advanced maternal age multigravida 35+,
 second trimester
 Grand multiparity, antepartum
 Poor obstetric history: Previous preterm
 delivery, antepartum
 Genetic carrier (SMA)
 LR NIPS
 Obesity complicating pregnancy, second
 trimester
 Maternal care for suspected fetal
 abnormality and damage
 22 weeks gestation of pregnancy
 Encounter for other antenatal screening
 follow-up
Fetal Evaluation

 Num Of Fetuses:         1
 Fetal Heart Rate(bpm):  153
 Cardiac Activity:       Observed
 Presentation:           Variable
 Placenta:               Posterior Fundal
 P. Cord Insertion:      Visualized

 Amniotic Fluid
 AFI FV:      Within normal limits

                             Largest Pocket(cm)

Biometry

 BPD:      55.4  mm     G. Age:  22w 6d         65  %    CI:        74.26   %    70 - 86
                                                         FL/HC:      17.4   %    18.4 -
 HC:      204.1  mm     G. Age:  22w 4d         41  %    HC/AC:      1.26        1.06 -
 AC:      161.6  mm     G. Age:  21w 2d         11  %    FL/BPD:     64.1   %    71 - 87
 FL:       35.5  mm     G. Age:  21w 2d          9  %    FL/AC:      22.0   %    20 - 24
 HUM:      33.6  mm     G. Age:  21w 3d         23  %
 LV:        5.3  mm

 Est. FW:     423  gm    0 lb 15 oz       8  %
OB History

 Blood Type:   O+
 Gravidity:    8         Term:   2        Prem:   4        SAB:   0
 TOP:          0       Ectopic:  1        Living: 6
Gestational Age

 LMP:           22w 3d        Date:  04/30/20                 EDD:   02/04/21
 U/S Today:     22w 0d                                        EDD:   02/07/21
 Best:          22w 3d     Det. By:  LMP  (04/30/20)          EDD:   02/04/21
Anatomy

 Cranium:               Appears normal         Aortic Arch:            Not well visualized
 Cavum:                 Previously seen        Ductal Arch:            Appears normal
 Ventricles:            Appears normal         Diaphragm:              Appears normal
 Choroid Plexus:        Previously seen        Stomach:                Appears normal, left
                                                                       sided
 Cerebellum:            Previously seen        Abdomen:                Appears normal
 Posterior Fossa:       Previously seen        Abdominal Wall:         Appears nml (cord
                                                                       insert, abd wall)
 Nuchal Fold:           Not applicable (>20    Cord Vessels:           Appears normal (3
                        wks GA)                                        vessel cord)
 Face:                  Orbits and profile     Kidneys:                Appear normal
                        previously seen
 Lips:                  Previously seen        Bladder:                Appears normal
 Thoracic:              Appears normal         Spine:                  Abnormal, see
                                                                       comments
 Heart:                 Abnormal, see          Upper Extremities:      Abnormal, see
                        comments                                       comments
 RVOT:                  Appears normal         Lower Extremities:      Appears normal
 LVOT:                  Not well visualized
 Other:  Fetus appears to be a male.
Doppler - Fetal Vessels

 Umbilical Artery
  S/D     %tile      RI    %tile      PI    %tile            ADFV    RDFV
   3.2       26    0.69       32    0.89      4.1               No      No

Impression

 Ms. Uhl is here for follow up growth, anatomy and
 known fetal growth restriction.

 The pregnancy has known absent right radius with angulated
 wrist. The lumbar spine appears slightly splayed.  The
 cardiac axis is again visualized and abnormal. She had a
 normal echocardiogram

 Normal interval growth with measurements consistent with
 FGR with an EFW at the 8th%.
 Good fetal movement and amniotic fluid volume

 I discussed today's findings with Ms. Uhl and reviewed
 the images in detail. In addition the amniocentesis results are
 back with a normal microarray.

 At this time she has been referred to a pediatric orthopedist
 at [facility].

 All questions were answered. We discussed the limitations of
 the fetal ultrasound in that all malformations may not be seen
 or fully appreciated.

 Follow up growth in 4 weeks.
Recommendations

 Follow up growth in 4 weeks.

## 2023-10-05 ENCOUNTER — Ambulatory Visit

## 2023-10-07 ENCOUNTER — Ambulatory Visit (INDEPENDENT_AMBULATORY_CARE_PROVIDER_SITE_OTHER)

## 2023-10-07 ENCOUNTER — Other Ambulatory Visit (HOSPITAL_COMMUNITY)
Admission: RE | Admit: 2023-10-07 | Discharge: 2023-10-07 | Disposition: A | Discharge status: Home / Self Care | Source: Ambulatory Visit | Attending: Obstetrics | Admitting: Obstetrics

## 2023-10-07 VITALS — BP 137/90 | HR 74

## 2023-10-07 DIAGNOSIS — Z113 Encounter for screening for infections with a predominantly sexual mode of transmission: Secondary | ICD-10-CM

## 2023-10-07 NOTE — Progress Notes (Signed)
 SUBJECTIVE:  38 y.o. female who desires a STI screen. Denies abnormal vaginal discharge, bleeding or significant pelvic pain. No UTI symptoms. Denies history of known exposure to STD.  LMP 09/18/23; pt requested hormones levels and HCG to be drawn due to late period, and only spotting. Advised that pt would need to schedule a provider visit.  OBJECTIVE:  She appears well.   ASSESSMENT:  STI Screen   PLAN:  Pt offered STI blood screening-requested GC, chlamydia, and trichomonas probe sent to lab.  Treatment: To be determined once lab results are received.  Pt follow up as needed.

## 2023-10-08 LAB — HIV ANTIBODY (ROUTINE TESTING W REFLEX): HIV Screen 4th Generation wRfx: NONREACTIVE

## 2023-10-08 LAB — CERVICOVAGINAL ANCILLARY ONLY
Bacterial Vaginitis (gardnerella): POSITIVE — AB
Candida Glabrata: NEGATIVE
Candida Vaginitis: POSITIVE — AB
Chlamydia: NEGATIVE
Comment: NEGATIVE
Comment: NEGATIVE
Comment: NEGATIVE
Comment: NEGATIVE
Comment: NEGATIVE
Comment: NORMAL
Neisseria Gonorrhea: NEGATIVE
Trichomonas: NEGATIVE

## 2023-10-08 LAB — HEPATITIS C ANTIBODY: Hep C Virus Ab: NONREACTIVE

## 2023-10-08 LAB — RPR: RPR Ser Ql: NONREACTIVE

## 2023-10-08 LAB — HEPATITIS B SURFACE ANTIGEN: Hepatitis B Surface Ag: NEGATIVE

## 2023-10-15 ENCOUNTER — Other Ambulatory Visit: Payer: Self-pay

## 2023-10-15 ENCOUNTER — Ambulatory Visit: Payer: Self-pay | Admitting: Obstetrics and Gynecology

## 2023-10-15 DIAGNOSIS — B379 Candidiasis, unspecified: Secondary | ICD-10-CM

## 2023-10-15 DIAGNOSIS — B9689 Other specified bacterial agents as the cause of diseases classified elsewhere: Secondary | ICD-10-CM

## 2023-10-15 MED ORDER — METRONIDAZOLE 500 MG PO TABS
500.0000 mg | ORAL_TABLET | Freq: Two times a day (BID) | ORAL | 0 refills | Status: AC
Start: 2023-10-15 — End: ?

## 2023-10-15 MED ORDER — FLUCONAZOLE 150 MG PO TABS: 150.0000 mg | ORAL_TABLET | Freq: Once | ORAL | 0 refills | Status: AC
